# Patient Record
Sex: Female | Born: 1985 | Hispanic: Yes | Marital: Single | Smoking: Current every day smoker
Health system: Southern US, Community
[De-identification: ages and names within clinical notes are randomized; demographics above are authoritative.]

## PROBLEM LIST (undated history)

## (undated) ENCOUNTER — Inpatient Hospital Stay (HOSPITAL_COMMUNITY): Payer: Self-pay

## (undated) DIAGNOSIS — D649 Anemia, unspecified: Secondary | ICD-10-CM

## (undated) DIAGNOSIS — K439 Ventral hernia without obstruction or gangrene: Secondary | ICD-10-CM

## (undated) DIAGNOSIS — Z23 Encounter for immunization: Secondary | ICD-10-CM

## (undated) DIAGNOSIS — T8859XA Other complications of anesthesia, initial encounter: Secondary | ICD-10-CM

## (undated) DIAGNOSIS — R590 Localized enlarged lymph nodes: Secondary | ICD-10-CM

## (undated) DIAGNOSIS — A630 Anogenital (venereal) warts: Secondary | ICD-10-CM

## (undated) DIAGNOSIS — J45909 Unspecified asthma, uncomplicated: Secondary | ICD-10-CM

## (undated) DIAGNOSIS — O26619 Liver and biliary tract disorders in pregnancy, unspecified trimester: Secondary | ICD-10-CM

## (undated) DIAGNOSIS — O9989 Other specified diseases and conditions complicating pregnancy, childbirth and the puerperium: Secondary | ICD-10-CM

## (undated) DIAGNOSIS — T4145XA Adverse effect of unspecified anesthetic, initial encounter: Secondary | ICD-10-CM

## (undated) DIAGNOSIS — F17200 Nicotine dependence, unspecified, uncomplicated: Secondary | ICD-10-CM

## (undated) DIAGNOSIS — O99891 Other specified diseases and conditions complicating pregnancy: Secondary | ICD-10-CM

## (undated) DIAGNOSIS — F419 Anxiety disorder, unspecified: Principal | ICD-10-CM

## (undated) DIAGNOSIS — F329 Major depressive disorder, single episode, unspecified: Secondary | ICD-10-CM

## (undated) DIAGNOSIS — IMO0002 Reserved for concepts with insufficient information to code with codable children: Secondary | ICD-10-CM

## (undated) DIAGNOSIS — Z6791 Unspecified blood type, Rh negative: Secondary | ICD-10-CM

## (undated) DIAGNOSIS — Z8614 Personal history of Methicillin resistant Staphylococcus aureus infection: Secondary | ICD-10-CM

## (undated) DIAGNOSIS — F32A Depression, unspecified: Secondary | ICD-10-CM

## (undated) DIAGNOSIS — R87629 Unspecified abnormal cytological findings in specimens from vagina: Secondary | ICD-10-CM

## (undated) HISTORY — DX: Encounter for immunization: Z23

## (undated) HISTORY — PX: CHOLECYSTECTOMY: SHX55

## (undated) HISTORY — DX: Ventral hernia without obstruction or gangrene: K43.9

## (undated) HISTORY — PX: FRACTURE SURGERY: SHX138

## (undated) HISTORY — DX: Nicotine dependence, unspecified, uncomplicated: F17.200

## (undated) HISTORY — DX: Unspecified abnormal cytological findings in specimens from vagina: R87.629

## (undated) HISTORY — DX: Anogenital (venereal) warts: A63.0

## (undated) HISTORY — DX: Localized enlarged lymph nodes: R59.0

## (undated) HISTORY — DX: Anxiety disorder, unspecified: F41.9

## (undated) HISTORY — PX: UPPER GI ENDOSCOPY: SHX6162

## (undated) HISTORY — PX: TUBAL LIGATION: SHX77

## (undated) HISTORY — PX: THERAPEUTIC ABORTION: SHX798

## (undated) HISTORY — PX: DILATION AND CURETTAGE OF UTERUS: SHX78

---
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
## 2007-01-02 ENCOUNTER — Emergency Department (HOSPITAL_COMMUNITY): Admission: EM | Admit: 2007-01-02 | Discharge: 2007-01-02 | Payer: Self-pay | Admitting: *Deleted

## 2007-01-21 ENCOUNTER — Inpatient Hospital Stay (HOSPITAL_COMMUNITY): Admission: AD | Admit: 2007-01-21 | Discharge: 2007-01-21 | Payer: Self-pay | Admitting: Obstetrics and Gynecology

## 2007-01-21 LAB — CONVERTED CEMR LAB: Hemoglobin: 14.2 g/dL

## 2007-03-31 ENCOUNTER — Inpatient Hospital Stay (HOSPITAL_COMMUNITY): Admission: AD | Admit: 2007-03-31 | Discharge: 2007-03-31 | Payer: Self-pay | Admitting: Obstetrics & Gynecology

## 2007-03-31 LAB — CONVERTED CEMR LAB
Antibody Screen: NEGATIVE
Chlamydia, DNA Probe: NEGATIVE
GC Culture Only: NEGATIVE
Rh Type: NEGATIVE
Rubella antibody, serum, titer: NON-IMMUNE/NOT IMMUNE

## 2007-04-15 ENCOUNTER — Encounter: Payer: Self-pay | Admitting: Family Medicine

## 2007-04-15 ENCOUNTER — Ambulatory Visit: Payer: Self-pay | Admitting: *Deleted

## 2007-05-27 ENCOUNTER — Encounter (INDEPENDENT_AMBULATORY_CARE_PROVIDER_SITE_OTHER): Payer: Self-pay | Admitting: Family Medicine

## 2007-05-27 ENCOUNTER — Ambulatory Visit: Payer: Self-pay | Admitting: Family Medicine

## 2007-05-27 DIAGNOSIS — O34219 Maternal care for unspecified type scar from previous cesarean delivery: Secondary | ICD-10-CM | POA: Insufficient documentation

## 2007-05-27 DIAGNOSIS — O09213 Supervision of pregnancy with history of pre-term labor, third trimester: Secondary | ICD-10-CM

## 2007-05-27 DIAGNOSIS — F329 Major depressive disorder, single episode, unspecified: Secondary | ICD-10-CM | POA: Insufficient documentation

## 2007-05-27 LAB — CONVERTED CEMR LAB: Protein, U semiquant: NEGATIVE

## 2007-05-28 ENCOUNTER — Encounter (INDEPENDENT_AMBULATORY_CARE_PROVIDER_SITE_OTHER): Payer: Self-pay | Admitting: Family Medicine

## 2007-06-08 ENCOUNTER — Ambulatory Visit: Payer: Self-pay | Admitting: Family Medicine

## 2007-06-08 LAB — CONVERTED CEMR LAB: Glucose, Urine, Semiquant: NEGATIVE

## 2007-06-10 ENCOUNTER — Telehealth: Payer: Self-pay | Admitting: *Deleted

## 2007-06-17 ENCOUNTER — Ambulatory Visit: Payer: Self-pay | Admitting: Family Medicine

## 2007-06-17 ENCOUNTER — Encounter: Payer: Self-pay | Admitting: Family Medicine

## 2007-06-17 LAB — CONVERTED CEMR LAB: Glucose, Urine, Semiquant: NEGATIVE

## 2007-06-30 ENCOUNTER — Encounter: Payer: Self-pay | Admitting: Family Medicine

## 2007-06-30 ENCOUNTER — Ambulatory Visit: Payer: Self-pay | Admitting: Family Medicine

## 2007-06-30 LAB — CONVERTED CEMR LAB
Glucose, Urine, Semiquant: NEGATIVE
Protein, U semiquant: NEGATIVE

## 2007-07-07 ENCOUNTER — Telehealth: Payer: Self-pay | Admitting: *Deleted

## 2007-07-07 ENCOUNTER — Inpatient Hospital Stay (HOSPITAL_COMMUNITY): Admission: AD | Admit: 2007-07-07 | Discharge: 2007-07-07 | Payer: Self-pay | Admitting: Obstetrics & Gynecology

## 2007-07-14 ENCOUNTER — Ambulatory Visit: Payer: Self-pay | Admitting: Family Medicine

## 2007-07-14 ENCOUNTER — Encounter: Payer: Self-pay | Admitting: Family Medicine

## 2007-07-14 LAB — CONVERTED CEMR LAB: Glucose, Urine, Semiquant: NEGATIVE

## 2007-07-20 ENCOUNTER — Telehealth: Payer: Self-pay | Admitting: *Deleted

## 2007-07-20 ENCOUNTER — Encounter (INDEPENDENT_AMBULATORY_CARE_PROVIDER_SITE_OTHER): Payer: Self-pay | Admitting: Family Medicine

## 2007-07-27 ENCOUNTER — Encounter: Payer: Self-pay | Admitting: Family Medicine

## 2007-07-27 ENCOUNTER — Ambulatory Visit: Payer: Self-pay | Admitting: Sports Medicine

## 2007-07-27 LAB — CONVERTED CEMR LAB
Chlamydia, DNA Probe: NEGATIVE
GC Probe Amp, Genital: NEGATIVE
Glucose, Urine, Semiquant: NEGATIVE
Protein, U semiquant: NEGATIVE

## 2007-07-28 ENCOUNTER — Encounter (INDEPENDENT_AMBULATORY_CARE_PROVIDER_SITE_OTHER): Payer: Self-pay | Admitting: Family Medicine

## 2007-08-03 ENCOUNTER — Encounter: Payer: Self-pay | Admitting: *Deleted

## 2007-08-10 ENCOUNTER — Telehealth: Payer: Self-pay | Admitting: *Deleted

## 2007-08-17 ENCOUNTER — Encounter: Payer: Self-pay | Admitting: Family Medicine

## 2007-08-17 ENCOUNTER — Ambulatory Visit: Payer: Self-pay | Admitting: Family Medicine

## 2007-08-17 LAB — CONVERTED CEMR LAB: Protein, U semiquant: NEGATIVE

## 2007-08-19 ENCOUNTER — Inpatient Hospital Stay (HOSPITAL_COMMUNITY): Admission: AD | Admit: 2007-08-19 | Discharge: 2007-08-19 | Payer: Self-pay | Admitting: Obstetrics & Gynecology

## 2007-08-19 ENCOUNTER — Ambulatory Visit: Payer: Self-pay | Admitting: *Deleted

## 2007-08-24 ENCOUNTER — Ambulatory Visit: Payer: Self-pay | Admitting: Family Medicine

## 2007-08-24 ENCOUNTER — Inpatient Hospital Stay (HOSPITAL_COMMUNITY): Admission: RE | Admit: 2007-08-24 | Discharge: 2007-08-27 | Payer: Self-pay | Admitting: Obstetrics & Gynecology

## 2007-08-24 ENCOUNTER — Ambulatory Visit: Payer: Self-pay | Admitting: Obstetrics & Gynecology

## 2007-09-15 ENCOUNTER — Ambulatory Visit: Payer: Self-pay | Admitting: Gynecology

## 2007-09-15 ENCOUNTER — Inpatient Hospital Stay (HOSPITAL_COMMUNITY): Admission: AD | Admit: 2007-09-15 | Discharge: 2007-09-16 | Payer: Self-pay | Admitting: Gynecology

## 2007-11-02 ENCOUNTER — Ambulatory Visit: Payer: Self-pay | Admitting: Sports Medicine

## 2007-11-02 ENCOUNTER — Encounter (INDEPENDENT_AMBULATORY_CARE_PROVIDER_SITE_OTHER): Payer: Self-pay | Admitting: Family Medicine

## 2007-11-02 DIAGNOSIS — N939 Abnormal uterine and vaginal bleeding, unspecified: Secondary | ICD-10-CM

## 2007-11-02 DIAGNOSIS — N926 Irregular menstruation, unspecified: Secondary | ICD-10-CM

## 2007-11-02 LAB — CONVERTED CEMR LAB
MCHC: 32.2 g/dL (ref 30.0–36.0)
MCV: 88.8 fL (ref 78.0–100.0)
Platelets: 371 10*3/uL (ref 150–400)
RBC: 4.75 M/uL (ref 3.87–5.11)

## 2007-11-03 ENCOUNTER — Ambulatory Visit (HOSPITAL_COMMUNITY): Admission: RE | Admit: 2007-11-03 | Discharge: 2007-11-03 | Payer: Self-pay | Admitting: Family Medicine

## 2007-11-03 ENCOUNTER — Encounter: Payer: Self-pay | Admitting: *Deleted

## 2007-11-03 ENCOUNTER — Telehealth (INDEPENDENT_AMBULATORY_CARE_PROVIDER_SITE_OTHER): Payer: Self-pay | Admitting: Family Medicine

## 2007-11-17 ENCOUNTER — Encounter: Payer: Self-pay | Admitting: *Deleted

## 2008-10-15 ENCOUNTER — Ambulatory Visit: Payer: Self-pay | Admitting: Obstetrics & Gynecology

## 2008-10-15 ENCOUNTER — Inpatient Hospital Stay (HOSPITAL_COMMUNITY): Admission: AD | Admit: 2008-10-15 | Discharge: 2008-10-15 | Payer: Self-pay | Admitting: Obstetrics & Gynecology

## 2009-01-12 ENCOUNTER — Inpatient Hospital Stay (HOSPITAL_COMMUNITY): Admission: AD | Admit: 2009-01-12 | Discharge: 2009-01-12 | Payer: Self-pay | Admitting: Obstetrics & Gynecology

## 2009-02-16 ENCOUNTER — Inpatient Hospital Stay (HOSPITAL_COMMUNITY): Admission: AD | Admit: 2009-02-16 | Discharge: 2009-02-16 | Payer: Self-pay | Admitting: Obstetrics & Gynecology

## 2009-02-16 ENCOUNTER — Ambulatory Visit: Payer: Self-pay | Admitting: Obstetrics and Gynecology

## 2009-02-22 ENCOUNTER — Ambulatory Visit: Payer: Self-pay | Admitting: Obstetrics & Gynecology

## 2009-02-22 ENCOUNTER — Encounter: Payer: Self-pay | Admitting: Physician Assistant

## 2009-02-22 LAB — CONVERTED CEMR LAB
Antibody Screen: NEGATIVE
Eosinophils Relative: 2 % (ref 0–5)
HCT: 37.4 % (ref 36.0–46.0)
Hemoglobin: 12.3 g/dL (ref 12.0–15.0)
Hepatitis B Surface Ag: NEGATIVE
Lymphocytes Relative: 18 % (ref 12–46)
Lymphs Abs: 2.8 10*3/uL (ref 0.7–4.0)
Neutro Abs: 11.6 10*3/uL — ABNORMAL HIGH (ref 1.7–7.7)
Platelets: 273 10*3/uL (ref 150–400)
RDW: 13.5 % (ref 11.5–15.5)
Rh Type: NEGATIVE
Rubella: 119.9 intl units/mL — ABNORMAL HIGH

## 2009-03-29 ENCOUNTER — Ambulatory Visit: Payer: Self-pay | Admitting: Obstetrics and Gynecology

## 2009-04-08 ENCOUNTER — Inpatient Hospital Stay (HOSPITAL_COMMUNITY): Admission: AD | Admit: 2009-04-08 | Discharge: 2009-04-08 | Payer: Self-pay | Admitting: Family Medicine

## 2009-04-12 ENCOUNTER — Ambulatory Visit: Payer: Self-pay | Admitting: Obstetrics and Gynecology

## 2009-04-15 ENCOUNTER — Ambulatory Visit: Payer: Self-pay | Admitting: Advanced Practice Midwife

## 2009-04-15 ENCOUNTER — Inpatient Hospital Stay (HOSPITAL_COMMUNITY): Admission: AD | Admit: 2009-04-15 | Discharge: 2009-04-15 | Payer: Self-pay | Admitting: Obstetrics & Gynecology

## 2009-04-22 ENCOUNTER — Encounter (INDEPENDENT_AMBULATORY_CARE_PROVIDER_SITE_OTHER): Payer: Self-pay | Admitting: *Deleted

## 2009-04-22 DIAGNOSIS — F17200 Nicotine dependence, unspecified, uncomplicated: Secondary | ICD-10-CM

## 2009-04-26 ENCOUNTER — Encounter: Payer: Self-pay | Admitting: Advanced Practice Midwife

## 2009-04-26 ENCOUNTER — Ambulatory Visit: Payer: Self-pay | Admitting: Obstetrics and Gynecology

## 2009-04-27 ENCOUNTER — Encounter: Payer: Self-pay | Admitting: Advanced Practice Midwife

## 2009-05-03 ENCOUNTER — Ambulatory Visit: Payer: Self-pay | Admitting: Obstetrics & Gynecology

## 2009-05-03 ENCOUNTER — Encounter: Payer: Self-pay | Admitting: Obstetrics & Gynecology

## 2009-05-03 LAB — CONVERTED CEMR LAB
ALT: 10 units/L (ref 0–35)
Albumin: 3.6 g/dL (ref 3.5–5.2)
Alkaline Phosphatase: 162 units/L — ABNORMAL HIGH (ref 39–117)
Bilirubin, Direct: 0.1 mg/dL (ref 0.0–0.3)
Total Bilirubin: 0.3 mg/dL (ref 0.3–1.2)
Total Protein: 6.5 g/dL (ref 6.0–8.3)

## 2009-05-05 ENCOUNTER — Inpatient Hospital Stay (HOSPITAL_COMMUNITY): Admission: AD | Admit: 2009-05-05 | Discharge: 2009-05-06 | Payer: Self-pay | Admitting: Obstetrics & Gynecology

## 2009-05-05 ENCOUNTER — Ambulatory Visit: Payer: Self-pay | Admitting: Physician Assistant

## 2009-05-06 ENCOUNTER — Inpatient Hospital Stay (HOSPITAL_COMMUNITY): Admission: AD | Admit: 2009-05-06 | Discharge: 2009-05-09 | Payer: Self-pay | Admitting: Obstetrics & Gynecology

## 2010-01-01 ENCOUNTER — Emergency Department (HOSPITAL_BASED_OUTPATIENT_CLINIC_OR_DEPARTMENT_OTHER): Admission: EM | Admit: 2010-01-01 | Discharge: 2010-01-01 | Payer: Self-pay | Admitting: Emergency Medicine

## 2010-05-05 ENCOUNTER — Inpatient Hospital Stay (HOSPITAL_COMMUNITY): Admission: AD | Admit: 2010-05-05 | Discharge: 2010-05-05 | Payer: Self-pay | Admitting: Obstetrics & Gynecology

## 2010-05-05 ENCOUNTER — Ambulatory Visit: Payer: Self-pay | Admitting: Advanced Practice Midwife

## 2010-08-30 ENCOUNTER — Inpatient Hospital Stay (HOSPITAL_COMMUNITY)
Admission: AD | Admit: 2010-08-30 | Discharge: 2010-08-30 | Payer: Self-pay | Source: Home / Self Care | Attending: Obstetrics & Gynecology | Admitting: Obstetrics & Gynecology

## 2010-08-30 LAB — POCT PREGNANCY, URINE: Preg Test, Ur: NEGATIVE

## 2010-08-30 LAB — WET PREP, GENITAL
Trich, Wet Prep: NONE SEEN
Yeast Wet Prep HPF POC: NONE SEEN

## 2010-08-30 LAB — URINALYSIS, ROUTINE W REFLEX MICROSCOPIC
Bilirubin Urine: NEGATIVE
Hgb urine dipstick: NEGATIVE
Specific Gravity, Urine: >1.03 — ABNORMAL HIGH (ref 1.005–1.030)
Urine Glucose, Fasting: NEGATIVE mg/dL
Urobilinogen, UA: 0.2 mg/dL (ref 0.0–1.0)
pH: 6 (ref 5.0–8.0)

## 2010-08-30 LAB — URINE MICROSCOPIC-ADD ON

## 2010-09-03 LAB — HERPES SIMPLEX VIRUS CULTURE

## 2010-10-01 ENCOUNTER — Encounter (INDEPENDENT_AMBULATORY_CARE_PROVIDER_SITE_OTHER): Payer: Self-pay | Admitting: Family Medicine

## 2010-10-01 ENCOUNTER — Other Ambulatory Visit: Payer: Self-pay | Admitting: Family Medicine

## 2010-10-01 ENCOUNTER — Encounter: Payer: Self-pay | Admitting: Obstetrics & Gynecology

## 2010-10-01 ENCOUNTER — Ambulatory Visit (INDEPENDENT_AMBULATORY_CARE_PROVIDER_SITE_OTHER): Payer: Self-pay | Admitting: Family Medicine

## 2010-10-01 DIAGNOSIS — A6000 Herpesviral infection of urogenital system, unspecified: Secondary | ICD-10-CM

## 2010-10-01 DIAGNOSIS — Z01419 Encounter for gynecological examination (general) (routine) without abnormal findings: Secondary | ICD-10-CM

## 2010-10-01 LAB — CONVERTED CEMR LAB
Chlamydia, DNA Probe: NEGATIVE
GC Probe Amp, Genital: NEGATIVE
Hepatitis B Surface Ag: NEGATIVE

## 2010-10-01 LAB — POCT PREGNANCY, URINE: Preg Test, Ur: NEGATIVE

## 2010-10-17 LAB — URINALYSIS, ROUTINE W REFLEX MICROSCOPIC
Glucose, UA: NEGATIVE mg/dL
Hgb urine dipstick: NEGATIVE
Specific Gravity, Urine: 1.025 (ref 1.005–1.030)
pH: 6 (ref 5.0–8.0)

## 2010-10-17 LAB — WET PREP, GENITAL: Yeast Wet Prep HPF POC: NONE SEEN

## 2010-10-17 LAB — GC/CHLAMYDIA PROBE AMP, GENITAL
Chlamydia, DNA Probe: NEGATIVE
GC Probe Amp, Genital: NEGATIVE

## 2010-10-17 LAB — POCT PREGNANCY, URINE: Preg Test, Ur: NEGATIVE

## 2010-10-22 LAB — URINALYSIS, ROUTINE W REFLEX MICROSCOPIC
Ketones, ur: NEGATIVE mg/dL
Nitrite: NEGATIVE

## 2010-10-22 LAB — GC/CHLAMYDIA PROBE AMP, GENITAL
Chlamydia, DNA Probe: NEGATIVE
GC Probe Amp, Genital: NEGATIVE

## 2010-10-22 LAB — CBC
HCT: 40 % (ref 36.0–46.0)
Hemoglobin: 13.3 g/dL (ref 12.0–15.0)
MCHC: 33.4 g/dL (ref 30.0–36.0)
MCV: 90.2 fL (ref 78.0–100.0)
RBC: 4.43 MIL/uL (ref 3.87–5.11)
RDW: 13 % (ref 11.5–15.5)

## 2010-10-26 NOTE — Progress Notes (Signed)
NAME:  [name], [name]              ACCOUNT NO.:  [id number]  MEDICAL RECORD NO.:  [id number]           PATIENT TYPE:  A  LOCATION:  [facility]                   FACILITY:  [facility]  PHYSICIAN:  Maryelizabeth Kaufmann, MD  DATE OF BIRTH:  [birth year]  DATE OF SERVICE:  10/01/2010                                 CLINIC NOTE  CHIEF COMPLAINT:  Annual Pap smear.  HISTORY OF PRESENT ILLNESS:  This is a 25 year old gravida 4, para 3-0-1- 3, with history of previous C-section x3 who presents with several complaints.  Most recently, she did go to the [facility] where she states she was diagnosed with herpes simplex virus outbreak.  She states that she was started on some pills, although she is unsure what the name is and she states that her symptoms resolved.  She also now complains of some non-painful lesions in the genital area, but she is not sure what they are.  So, she desires to be checked for all STDs and she desires the urine pregnancy test.  She states that she has had one new partner within the last year.  She has a lifetime partners of about 11.  She is currently sexually active, on no form of contraception.  However, she is unclear if she desires current pregnancy.  She otherwise denies any vaginal itching.  No odor, but does note some white discharge.  She does have a history of gonorrhea in 2005 and she never had a Pap smear before.  PHYSICAL EXAMINATION:  VITAL SIGNS:  Blood pressure 97/61, pulses 80, temperature 98.8, weight 119.5, and height 4 feet 11 inches. GENERAL:  The patient is in no acute distress.  Alert and oriented x4. NECK:  Supple.  Soft.  No thyromegaly. CARDIOVASCULAR:  Regular rate and rhythm.  No murmurs, rubs, or gallops. CHEST:  Clear to auscultation bilaterally.  No wheezing, rales, or rhonchi. BREASTS:  Soft and nontender.  No masses.  No discharge.  No axillary lymphadenopathy. ABDOMEN:  Positive bowel sounds.  Soft.  She does have some suprapubic and left lower  quadrant tenderness to palpation, but no rebound.  No guarding. EXTREMITIES:  No clubbing, cyanosis, or edema. GU:  She does have what appears to be may be some skin tags on her right labia majora inferiorly approximately at about 7 o'clock, they are otherwise nontender.  They do not appear herpetic.  They are not vesicular.  Otherwise, external genitalia appeared to be normal with no additional lesion.  Vaginal mucosa appeared to be pink and normal. Cervix appeared to be normal.  No lesions.  A Pap smear was performed as well as gonorrhea and Chlamydia.  Cervical motion tenderness is negative on bimanual exam, but she did have some tenderness on suprapubic pressure.  Uterus appeared to be anteverted, otherwise, mobile, soft. No masses palpable bilaterally.  ASSESSMENT AND PLAN:  This is a 25 year old gravida 4, para 3-0-1-3 with, 1. HSV, status post resolution.  We will start prophylactic acyclovir     400 mg one tab p.o. b.i.d.  We will also check HSV 1 and 2 titers,     IgM and IgG. 2. STD exposure.  We will  check for gonorrhea, Chlamydia, HIV, RPR,     and acute hepatitis panel. 3. Well woman with Pap along with all of her labs appear to be normal.     We will have the patient back in 8-6 months to a year for either     annual checkup or any further complications or problems.          ______________________________ Maryelizabeth Kaufmann, MD    LC/MEDQ  D:  10/01/2010  T:  10/02/2010  Job:  [id number]

## 2010-11-08 LAB — CBC
HCT: 40.3 % (ref 36.0–46.0)
Hemoglobin: 10.2 g/dL — ABNORMAL LOW (ref 12.0–15.0)
Hemoglobin: 13.4 g/dL (ref 12.0–15.0)
MCHC: 33.9 g/dL (ref 30.0–36.0)
Platelets: 224 10*3/uL (ref 150–400)
RBC: 4.38 MIL/uL (ref 3.87–5.11)
RDW: 13.8 % (ref 11.5–15.5)
RDW: 13.8 % (ref 11.5–15.5)
WBC: 17.9 10*3/uL — ABNORMAL HIGH (ref 4.0–10.5)

## 2010-11-08 LAB — RH IMMUNE GLOB WKUP(>/=20WKS)(NOT WOMEN'S HOSP): Fetal Screen: NEGATIVE

## 2010-11-09 LAB — POCT URINALYSIS DIP (DEVICE)
Bilirubin Urine: NEGATIVE
Glucose, UA: NEGATIVE mg/dL
Hgb urine dipstick: NEGATIVE
Ketones, ur: NEGATIVE mg/dL
Ketones, ur: NEGATIVE mg/dL
Ketones, ur: NEGATIVE mg/dL
Nitrite: NEGATIVE
Protein, ur: NEGATIVE mg/dL
Protein, ur: NEGATIVE mg/dL
Protein, ur: NEGATIVE mg/dL
Specific Gravity, Urine: 1.015 (ref 1.005–1.030)
Urobilinogen, UA: 0.2 mg/dL (ref 0.0–1.0)
pH: 6.5 (ref 5.0–8.0)
pH: 7 (ref 5.0–8.0)

## 2010-11-09 LAB — URINALYSIS, ROUTINE W REFLEX MICROSCOPIC
Ketones, ur: NEGATIVE mg/dL
Nitrite: NEGATIVE
Protein, ur: NEGATIVE mg/dL

## 2010-11-09 LAB — WET PREP, GENITAL: Yeast Wet Prep HPF POC: NONE SEEN

## 2010-11-10 LAB — POCT URINALYSIS DIP (DEVICE)
Glucose, UA: NEGATIVE mg/dL
Hgb urine dipstick: NEGATIVE
Protein, ur: NEGATIVE mg/dL
Specific Gravity, Urine: 1.02 (ref 1.005–1.030)
Urobilinogen, UA: 0.2 mg/dL (ref 0.0–1.0)
pH: 7 (ref 5.0–8.0)

## 2010-11-11 LAB — POCT URINALYSIS DIP (DEVICE)
Bilirubin Urine: NEGATIVE
Hgb urine dipstick: NEGATIVE
Nitrite: NEGATIVE
Protein, ur: 30 mg/dL — AB
pH: 6 (ref 5.0–8.0)

## 2010-11-11 LAB — COMPREHENSIVE METABOLIC PANEL
CO2: 23 mEq/L (ref 19–32)
Calcium: 8.5 mg/dL (ref 8.4–10.5)
Creatinine, Ser: 0.48 mg/dL (ref 0.4–1.2)
GFR calc Af Amer: >60 mL/min (ref >60)
GFR calc non Af Amer: >60 mL/min (ref >60)
Glucose, Bld: 83 mg/dL (ref 70–99)

## 2010-11-11 LAB — RAPID URINE DRUG SCREEN, HOSP PERFORMED
Barbiturates: NOT DETECTED
Benzodiazepines: NOT DETECTED
Cocaine: NOT DETECTED
Opiates: NOT DETECTED

## 2010-11-11 LAB — CBC
Hemoglobin: 11.5 g/dL — ABNORMAL LOW (ref 12.0–15.0)
MCHC: 34.8 g/dL (ref 30.0–36.0)
MCV: 93 fL (ref 78.0–100.0)
RBC: 3.56 MIL/uL — ABNORMAL LOW (ref 3.87–5.11)

## 2010-11-11 LAB — URINALYSIS, ROUTINE W REFLEX MICROSCOPIC
Bilirubin Urine: NEGATIVE
Hgb urine dipstick: NEGATIVE
Specific Gravity, Urine: 1.02 (ref 1.005–1.030)
Urobilinogen, UA: 0.2 mg/dL (ref 0.0–1.0)

## 2010-11-12 LAB — URINALYSIS, ROUTINE W REFLEX MICROSCOPIC
Glucose, UA: NEGATIVE mg/dL
Nitrite: NEGATIVE
Protein, ur: NEGATIVE mg/dL

## 2010-11-12 LAB — DIFFERENTIAL
Basophils Absolute: 0 10*3/uL (ref 0.0–0.1)
Lymphocytes Relative: 17 % (ref 12–46)
Monocytes Absolute: 0.8 10*3/uL (ref 0.1–1.0)
Monocytes Relative: 6 % (ref 3–12)
Neutro Abs: 11 10*3/uL — ABNORMAL HIGH (ref 1.7–7.7)

## 2010-11-12 LAB — WET PREP, GENITAL: Clue Cells Wet Prep HPF POC: NONE SEEN

## 2010-11-12 LAB — CBC
Hemoglobin: 12 g/dL (ref 12.0–15.0)
RBC: 3.63 MIL/uL — ABNORMAL LOW (ref 3.87–5.11)

## 2010-11-15 LAB — CBC
Platelets: 258 10*3/uL (ref 150–400)
RDW: 13.1 % (ref 11.5–15.5)
WBC: 12.5 10*3/uL — ABNORMAL HIGH (ref 4.0–10.5)

## 2010-11-15 LAB — URINALYSIS, ROUTINE W REFLEX MICROSCOPIC
Glucose, UA: NEGATIVE mg/dL
Hgb urine dipstick: NEGATIVE
Ketones, ur: 15 mg/dL — AB
Nitrite: NEGATIVE
Specific Gravity, Urine: >1.03 — ABNORMAL HIGH (ref 1.005–1.030)
pH: 6.5 (ref 5.0–8.0)

## 2010-11-15 LAB — RAPID URINE DRUG SCREEN, HOSP PERFORMED
Barbiturates: NOT DETECTED
Cocaine: NOT DETECTED
Opiates: NOT DETECTED

## 2010-11-15 LAB — GC/CHLAMYDIA PROBE AMP, URINE: Chlamydia, Swab/Urine, PCR: NEGATIVE

## 2010-11-15 LAB — GLUCOSE, CAPILLARY: Glucose-Capillary: 87 mg/dL (ref 70–99)

## 2010-11-15 LAB — ABO/RH: ABO/RH(D): O NEG

## 2010-12-18 NOTE — Op Note (Signed)
[name], [name]              ACCOUNT NO.:  [id number]   MEDICAL RECORD NO.:  [id number]          PATIENT TYPE:  INP   LOCATION:  9107                          [facility]   PHYSICIAN:  Norton Blizzard, MD    DATE OF BIRTH:  06-19-1986   DATE OF PROCEDURE:  08/24/2007  DATE OF DISCHARGE:                               OPERATIVE REPORT   PREOPERATIVE DIAGNOSES:  1. Intrauterine pregnancy at 39-6/7 weeks.  2. Prior cesarean section.  3. Desires elective repeat.   POSTOPERATIVE DIAGNOSES:  1. Intrauterine pregnancy at 39-6/7 weeks.  2. Prior cesarean section.  3. Desires elective repeat.   PROCEDURE:  Repeat low transverse cesarean section via Pfannenstiel  incision.   SURGEON:  Norton Blizzard, MD.   ASSISTANTS:  1. Karlton Lemon, MD.  2. Tamela Oddi, PA student.   ANESTHESIA:  Spinal.   IV FLUIDS:  3100 mL of lactated Ringers.   URINE OUTPUT:  175 mL of clear, yellow urine.   ESTIMATED BLOOD LOSS:  600 mL.   INDICATIONS FOR PROCEDURE:  Patient is a 25 year old G3, P1-0-1-1 at 38-  6/7 weeks here for scheduled repeat cesarean section.  She has a history  of a prior cesarean section in 2007 for failure to progress,  nonreassuring fetal heart tracing and persistent occiput posterior.  She  was [facility] for her prenatal [facility].  Patient was given an option of trial of labor after cesarean section  versus repeat cesarean section but opted for a repeat cesarean section.  The risks of cesarean section were explained to the patient on the day  of surgery and written informed consent was obtained.   FINDINGS:  Viable female infant in cephalic presentation.  Apgars were 9  and 9.  Weight 6 pounds, 15 ounces.  Spontaneous placental delivery,  intact with three vessel cord.  Normal uterus, tubes and ovaries  bilaterally.  Minimal adhesions or scar tissue observed.   SPECIMENS:  Placenta.   COMPLICATIONS:  None.   DESCRIPTION OF PROCEDURE:   The patient was taken to the operating room  where spinal anesthesia was placed and found to be adequate.  She was  then placed in the dorsal supine position with a leftward tilt, and  prepped and draped in a sterile manner.  A Pfannenstiel incision was  made over her pre-existing scar and taken through to the underlying  layer of fascia.  The fascia was incised in the midline, and this  incision was extended bilaterally using the Mayo scissors.  Kocher  clamps were applied to the superior aspect of the fascial incision, and  the rectus muscles were dissected off bluntly and sharply.  A similar  process was carried out on the inferior aspect of the incision.  The  rectus muscles were separated in the midline sharply, and the peritoneum  was also entered sharply.  The peritoneal incision was extended  superiorly and posteriorly with good visualization of bowel and bladder.  Attention was then turned to the lower uterine segment where bladder  flap was created and a transverse hysterotomy was  made with a scalpel  and extended bluntly.  The amniotic fluid sac was then ruptured for  clear fluid.  The infant was then delivered atraumatically.  The cord  was cut and clamped and the infant was handed over to the waiting  pediatricians.  Cord blood was collected according to protocol.  Uterine  massage was administered, and the placenta delivered intact with three  vessel cord.  The uterus was then cleared of all clot and debris.  The  hysterotomy was repaired using 0 Monocryl in running locked fashion.  Overall, good hemostasis was noted.  The gutters of the pelvis were then  cleared of all clots and debris.  The muscles were reapproximated using  two interrupted sutures of 0 Vicryl. The fascia was reapproximated using  0 Vicryl in a running stitch, and skin was closed with staples.  The  patient tolerated procedure well.  Sponge, needle and instrument counts  were correct x2.  She was taken to  the recovery room awake and in stable  condition.      Norton Blizzard, MD  Electronically Signed     UAD/MEDQ  D:  08/24/2007  T:  08/24/2007  Job:  [id number]

## 2010-12-18 NOTE — Discharge Summary (Signed)
[name], [name]              ACCOUNT NO.:  [id number]   MEDICAL RECORD NO.:  [id number]          PATIENT TYPE:  INP   LOCATION:  9107                          [facility]   PHYSICIAN:  Norton Blizzard, MD    DATE OF BIRTH:  04/07/1986   DATE OF ADMISSION:  08/24/2007  DATE OF DISCHARGE:  08/19/2007                               DISCHARGE SUMMARY   ADMISSION DIAGNOSIS:  1. Intrauterine pregnancy at 39 weeks, 1 day.  2. History of previous C-section x1. Desired repeat C-section.  3. Rh negative blood type.  4. Group B streptococcus unknown.   DISCHARGE DIAGNOSIS:  1. On postoperative day number three from repeat low transverse      Cesarean section.  2. Rh negative blood type.  3. Anemia.  4. Group B streptococcus unknown.   PROCEDURE:  The patient had a repeat low transverse Cesarean section  performed by Dr. Johnella Moloney on August 24, 2007.   COMPLICATIONS:  None.   CONSULTATIONS:  None.   LABORATORY DATA:  The patient had an admission CBC with white blood cell  count 12.3, hemoglobin 11.5, platelets 287. Blood type was 0 negative.  Antibody screen was negative. HIV screen was nonreactive, syphilis and  RPR was nonreactive. On postoperative day number one, white blood cell  count was 13.6, hemoglobin 9.2, hematocrit 26.9, platelets 220.   BRIEF PERTINENT ADMISSION HISTORY:  [name] is a 25 year old G-3, P-  1, 0-1-1 at 39 weeks and 1 day, presenting for repeat low transverse  Cesarean section with a history of 1 previous C-section and desires a  repeat.   HOSPITAL COURSE:  The patient was admitted, taken to the operating room  where she delivered a viable infant female weighing 6 pounds 15 ounces  with Apgars of 9 at one minute and 9 at five minutes via repeat low  transverse Cesarean section. The patient tolerated the procedure well  and had uncomplicated postoperative course. She had stable vitals and  remained afebrile. She was ambulating well with mild to  moderate pain.  She was complaining of moderate abdominal tenderness. It was mildly  relieved with Percocet. She states the pain was similar to her previous  C-section. She was tolerating p.o., voiding and passing flatus. As  stated previously she had been afebrile with stable vitals. Her  hemoglobin had dropped to 9.2 and she was started on iron. She is in  stable condition for discharge.   DISCHARGE STATUS:  Stable.   DISCHARGE MEDICATIONS:  1. Prenatal vitamins, 1 tablet p.o. daily.  2. Ibuprofen 600 mg, 1 tablet p.o. q 6h p.r.n. pain.  3 Percocet 5/325, 1-2 tablets q 6h p.r.n. pain.  1. Colace 100 mg, 1 tablet b.i.d.  2. Ferrous sulfate 325 mg, 1 tablet p.o. daily.   DISCHARGE INSTRUCTIONS:  1. Discharged home on regular diet.  2. No sex for 6 weeks.  3. No lifting greater than 15 pounds for 6 weeks.  4. Follow up at [facility] in 6 weeks for      postpartum check and placement of IUD. The patient did  receive Depo-      Provera 50 mg IM prior to discharge.      Karlton Lemon, MD      Norton Blizzard, MD  Electronically Signed    NS/MEDQ  D:  08/27/2007  T:  08/27/2007  Job:  [id number]

## 2010-12-21 NOTE — Op Note (Signed)
NAME:  ZUHA, DEJONGE              ACCOUNT NO.:  [id number]   MEDICAL RECORD NO.:  [id number]          PATIENT TYPE:  INP   LOCATION:  A401                          FACILITY:  [facility]   PHYSICIAN:  Tilda Burrow, M.D. DATE OF BIRTH:  01-07-1986   DATE OF PROCEDURE:  01/18/2006  DATE OF DISCHARGE:                                 OPERATIVE REPORT   PROCEDURE:  Epidural catheter placement.   DETAILS OF PROCEDURE:  Continuous lumbar epidural catheter placed after  acknowledging patient consent.  The patient was placed in the sitting  position, having received a fluid bolus.  The back was prepped and draped  and local anesthesia applied.  An epidural catheter was placed using loss of  resistance technique to a depth of 5.5 cm, whereupon loss of resistance was  encountered.  The epidural bolus dose of 5 mL of 1.5% lidocaine with  epinephrine was infused followed by insertion of epidural catheter, removal  of the Tuohy needle, taping of the catheter to the back and infusion of a 7  mL bolus of 0.125% Marcaine with 2 mcg/mL of fentanyl.  The patient had good  analgesic, symmetric analgesic effect at T10 level with satisfactory patient  response to analgesia.      Tilda Burrow, M.D.  Electronically Signed     JVF/MEDQ  D:  01/18/2006  T:  01/19/2006  Job:  [id number]   cc:   [facility]  [address] [place], [place] [address]

## 2010-12-21 NOTE — Op Note (Signed)
[name], [name]              ACCOUNT NO.:  [id number]   MEDICAL RECORD NO.:  [id number]          PATIENT TYPE:  INP   LOCATION:  A401                          FACILITY[facility]   PHYSICIAN:  Richardean Canal, M.D.  DATE OF BIRTH:  Jan 20, 1986   DATE OF PROCEDURE:  01/18/2006  DATE OF DISCHARGE:                                 OPERATIVE REPORT   PREOPERATIVE DIAGNOSES:  1.  Intrauterine gestation at 39.6 weeks.  2.  Failure to progress.  3.  Nonreassuring fetal heart rate pattern.   POSTOPERATIVE DIAGNOSES:  1.  Intrauterine gestation at 39.6 weeks.  2.  Failure to progress.  3.  Nonreassuring fetal heart rate pattern.  4.  Persistent occiput posterior.   PROCEDURE:  Primary low transverse cesarean section.   SURGEON:  Richardean Canal, M.D.   ANESTHESIA:  Spinal.   DESCRIPTION OF THE OPERATIVE PROCEDURE:  Following induction of spinal  anesthesia, the patient was placed in the supine position with a wedge under  the right hip.  The abdomen was suitably prepped and draped.  The abdomen  was opened through a Pfannenstiel incision.  The uterovesical fold of  peritoneum was incised transversely and the bladder flap developed with  sharp dissection.  A transverse incision was made in the lower uterine  segment and extended with two index finger.  The newborn was presenting  occiput posterior with the head well-engaged.  The head was disengaged from  the pelvis and delivered without difficulty through the incision.  The  airway was suctioned prior to the first breath.  The patient was delivered  of a normal-appearing, 6-pound 3-ounce female infant, Apgars 9/10.  The cord  was clamped and divided, and the newborn was transferred to the pediatrician  for newborn care.  Cord blood was collected.  Ancef 1 g was administered  intravenously.  Pitocin 20 units was added to the intravenous infusion and  run rapidly.  Subsequently, an additional 3 units of Pitocin was  administered  intravenously.  The uterus was quite boggy and was delivered  through the incision and massaged.  The uterus responded with a combination  of massage and Pitocin with minimal blood loss.  The uterus was quite  softened in consistency.  The uterine incision was closed with a running  interlocking 0 chromic suture.  A second layer of 0 chromic suture was  placed in a running noninterlocking stitch with 0 chromic suture.  The line  of incision appeared dry.  By this time, the uterus had contracted well.  The tubes and ovaries were inspected and appeared normal.  The uterus was  replaced in the abdomen.  The gutters were cleansed.  The peritoneum was  washed with normal saline.  The peritoneum was closed with a running 2-0  Vicryl suture.  The rectus muscle were approximated with three interrupted 0  Vicryl sutures.  The fascia was closed with two segmentally run 0 Vicryl  sutures tied together in the midline.  The subcutaneous layer was irrigated  with normal saline and carefully inspected for bleeding. The subcutaneous  layer  appeared dry.  The skin was closed with skin staples.  Estimated blood loss  was 300 mL.  All sponge, instrument, and needle counts were reported  correct.  The patient tolerated the procedure well and left the operating  room in satisfactory postoperative condition.           ______________________________  Richardean Canal, M.D.     RW/MEDQ  D:  01/18/2006  T:  01/20/2006  Job:  [id number]
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
## 2010-12-21 NOTE — Op Note (Signed)
[place]  Patient:    [name], [name]                     MRN: [id number] Proc. Date: 11/19/00 Adm. Date:  [id number] Disc. Date: [id number] Attending:  Ronne Binning                           Operative Report  PREOPERATIVE DIAGNOSIS:  Intra-articular fracture proximal interphalangeal, right little finger.  POSTOPERATIVE DIAGNOSIS:  Intra-articular fracture proximal interphalangeal, right little finger.  OPERATION:  Open reduction internal fixation comminuted intra-articular fracture of right little finger PIP.  SURGEON:  Nicki Reaper, M.D.  ASSISTANT:  Joaquin Courts, R.N.  ANESTHESIA:  General.  DATE OF OPERATION:  November 19, 2000  ANESTHESIOLOGIST:  Kaylyn Layer. Michelle Piper, M.D.  HISTORY:  The patient is a 25 year old female with a history of an injury to her right little finger.  X-rays show a comminuted fracture with significant displacement of the distal portion of the proximal phalanx right little finger.  A thorough discussion with the parents was undertaken with respect to avascular necrosis, stiffness, nonunion of the fracture site, the possibility of infection.  PROCEDURE:  The patient was brought to the operating room where a general anesthesia was carried out without difficulty.  She was prepped and draped using Betadine scrub and solution with the right arm free.  The limb was exsanguinated with an Esmarch bandage.  A tourniquet placed on the forearm was inflated to 200 mmHg.  Attempted manipulation was entirely unsuccessful.  The finger was blocked, being able to flex only approximately 35-40 degrees. Attempted reduction was entirely unsuccessful.  An incision was then made curvilinear over the dorsal aspect of the proximal and middle phalanges, carried down through subcutaneous tissue.  The extensor tendon was split longitudinally and centrally, the joint opened.  A moderate amount of blood was present.  This was irrigated  clear.  The fracture was immediately apparent with marked comminution.  One large fragment was present radially.  This was attempted to be reduced, was entirely unsuccessful.  With some manipulation, taking care to try to preserve as much soft tissue attachment and certainly the collateral ligament, the fracture was able to be reduced.  However, x-rays revealed that the volar portion of the articular surface was displaced proximally as another fragment and blocking flexion.  With manipulation the joint was able to be opened enough to visualize this fracture fragment.  It was then delivered distally, the finger placed into flexion, and it was able to be reduced and maintained.  Pins were placed by hand, firmly fixing the fracture.  However, with the finger in full extension the volar fragment immediately displaced.  There was no way to fix this to the proximal shaft in that a central dorsal portion of the articular surface was an entirely free and missing fragment, having been irrigated clear.  It was decided not to place this fragment back in in that it afforded no significant stability. With the finger flexed approximately 25 degrees the fracture was able to be maintained.  The pin was placed through the loose radial condyle into the shaft, maintaining this into position.  A second wire - 2.8 - was then passed through the middle phalanx into the proximal phalanx and this stabilized the volar fracture.  X-rays revealed this to be reduced in both AP, lateral and oblique positions.  The wound was copiously irrigated with  saline.  The extensor tendon was closed with figure-of-eight 5-0 [place] sutures and the skin was closed with interrupted 5-0 chromic sutures.  The pins were bent and cut short.  A sterile compressive dressing and splint to the ring and little finger were applied.  The patient tolerated the procedure well and was taken to the recovery room for observation in satisfactory  condition.  FOLLOW-UP:  She is discharged home to return to [facility] in one week on Tylenol No. 3 and Septra DS.  It is noted that she received 0.5 g of vancomycin in the operating room. DD:  11/19/00 TD:  11/20/00 Job: [id number]

## 2010-12-21 NOTE — H&P (Signed)
[name], [name]              ACCOUNT NO.:  [id number]   MEDICAL RECORD NO.:  [id number]          PATIENT TYPE:  OBV   LOCATION:  A418                          FACILITY:  [facility]   PHYSICIAN:  Langley Gauss, MD     DATE OF BIRTH:  06-13-1986   DATE OF ADMISSION:  06/10/2005  DATE OF DISCHARGE:  LH                                HISTORY & PHYSICAL   OBSERVATION STATUS:  On June 11, 2005, 25 year old, gravida 5, para 0,  came to the emergency room and was seen by Dr. Margretta Ditty, stating that she  was about four weeks pregnant, no prenatal care today.  Said that when she  urinated today, she looked in the toilet and there were about three clumps  of blood.  For this reason she presented to the emergency room.  I was  contacted due to my OB unassigned obligation on today's date of visit.   ALLERGIES:  No known drug allergies.   MEDICATIONS:  Taking no medications.   PAST MEDICAL HISTORY:  By emergency room history, the patient has had prior  terminations of pregnancies.  No other medical or surgical history.   PHYSICAL EXAMINATION:  VITAL SIGNS:  Blood pressure 98/55, heart rate 79.  GENERAL:  The patient appears somewhat tearful.  HEENT:  Negative.  No adenopathy.  NECK:  Supple. Thyroid is not palpable.  CARDIOVASCULAR:  Regular rate and rhythm.  ABDOMEN:  Soft, nontender.  PELVIC:  Normal external genitalia.  Cervix was noted to be closed with only  minimal active bleeding is identified.  No adnexal masses identified.  No  adnexal masses appreciated on examination.   At 0030 hours, the hospital should have performed OB ultrasound.  However,  sonographer not available.  Thus I initiated observation status with plans  to proceed with transvaginal ultrasound today.   LABORATORY DATA:  The patient is noted to be an O negative blood type.  She  has received full dose RhoGAM.  Her quantitative beta hCG is noted to be  136, 631.  Hemoglobin 12.8, hematocrit 37.3 with the white  count of 16.6.   ASSESSMENT/PLAN:  The patient has no prenatal care to date, complaining  primarily of vaginal bleeding with passage of clots. Thus, diagnosis is  threatened abortion.   PLAN:  Subsequently to proceed with transvaginal ultrasound to ascertain  viability and location of pregnancy.      Langley Gauss, MD  Electronically Signed     DC/MEDQ  D:  06/11/2005  T:  06/11/2005  Job:  [id number]

## 2010-12-21 NOTE — Discharge Summary (Signed)
NAME:  [name], [name]              ACCOUNT NO.:  [id number]   MEDICAL RECORD NO.:  [id number]          PATIENT TYPE:  INP   LOCATION:  A401                          FACILITY:  [facility]   PHYSICIAN:  Tilda Burrow, M.D. DATE OF BIRTH:  1986/07/16   DATE OF ADMISSION:  01/18/2006  DATE OF DISCHARGE:  06/19/2007LH                                 DISCHARGE SUMMARY   ADMITTING DIAGNOSIS:  Pregnancy, term, early labor.   DISCHARGE DIAGNOSES:  1.  Pregnancy, term, delivered.  2.  Cephalopelvic disproportion secondary to persistent occiput posterior      presentation.  3.  Rh negative status, RhoGAM administered.   PROCEDURES:  1.  Pitocin augmentation of labor.  2.  Continuous lumbar epidural catheter placement, January 18, 2006, Dr. Tilda Burrow.  3.  Primary low transverse cervical cesarean section, January 18, 2006, Dr.      Richardean Canal.   DISCHARGE MEDICATIONS:  1.  Multivitamins 1 p.o. daily.  2.  Tylox 20 tablets, 1 q.4 h. p.r.n. pain.   DETAILS OF HOSPITAL SUMMARY:  This 25 year old primipara, followed at the  clinic in [place], was admitted here for labor management.  She arrived  here, considered transfer to [facility], but decided to stay for labor  management.  She was 2-3 cm, vertex 0 station, and membranes intact upon  arrival.   The patient was admitted.  Pitocin augmentation of labor initiated.  Continuous lumbar epidural placed in midafternoon, 3:00 p.m. She progressed  to 6-7 cm dilated but arrested in labor at that time.  She was taken to  primary low transverse cervical cesarean section, delivering a healthy  female infant, Apgars 9 and 9, from an occiput posterior presentation.  Postoperatively, hemoglobin and hematocrit gradually equilibrated, with  hemoglobin 8.6 and hematocrit 24.9, compared to admitting hemoglobin of 12,  hematocrit of 36.  She was considered slow to recover.  She had abdominal  tenderness around the incision, deep in the abdomen,  though the incision  itself looked  fine.  She was discharged home, January 21, 2006, tolerating a regular diet,  with Tylox for pain, and iron tablets, Collagen Forte b.i.d., and prenatal  vitamins.  She is breastfeeding and will follow up in 3 days in our office  for staple removal and incision check.      Tilda Burrow, M.D.  Electronically Signed     JVF/MEDQ  D:  01/21/2006  T:  01/21/2006  Job:  [id number]   cc:   Dr. Milinda Cave

## 2010-12-21 NOTE — H&P (Signed)
NAME:  [name], [name]              ACCOUNT NO.:  [id number]   MEDICAL RECORD NO.:  [id number]          PATIENT TYPE:  INP   LOCATION:  LDR1                          FACILITY:  [facility]   PHYSICIAN:  Richardean Canal, M.D.  DATE OF BIRTH:  Nov 05, 1985   DATE OF ADMISSION:  01/18/2006  DATE OF DISCHARGE:  LH                                HISTORY & PHYSICAL   HISTORY:  This is a 25 year old female with [facility] January 19, 2006, admitted at  term in early labor.  The patient has had prenatal care at the Clinic in  [place], and the record has been reviewed.  The prenatal course has been  uneventful, except for the patient being positive for marijuana (and also  positive on admission here), and being group B strep-positive.  The patient  came to the hospital complaining of pain, and was placed on the monitor,  where a labor pattern was identified.  In the course of observation, the  patient's cervix changed -- making the diagnosis of labor.  The patient is a  reactive NST.   PAST MEDICAL HISTORY:  OBSTETRIC:  The patient had a therapeutic abortion in 2004, and received RhoGAM.   MEDICAL:  Negative.   SURGICAL:  Therapeutic abortion 2004.   DRUG ALLERGIES:  NONE KNOWN.   PRESCRIPTION MEDICATIONS:  Known none.   SOCIAL HISTORY:  The patient is positive for marijuana in the prenatal  course, and smoked marijuana one day prior to admission.   PHYSICAL EXAMINATION:  GENERAL:  The patient appears as a well-developed,  well-nourished term pregnant female, in early labor.  HEART:  Normal sinus rhythm.  No murmurs are heard.  LUNGS:  Clear.  ABDOMEN:  The fetus is presenting vertex, with the head engaged.  The fundus  is term size.  Estimated fetal weight is 6.5 pounds.  There are no surgical  scars on the abdomen.  The uterus is soft and nontender between  contractions.  EXTREMITIES:  Negative.  PELVIC:  The cervix is posterior, completely effaced, 2-3 cm dilated with a  vertex at 0 station.   Membranes were intact.   IMPRESSION:  Intrauterine gestation at 39.6 weeks; early labor.  Group B  strep-positive.  Positive marijuana screen.   DISPOSITION:  Membranes were ruptured, with clear fluid noted.  Stadol 1 mg  has been administered intravenously.  The patient has requested an epidural,  which will be placed shortly.   MANAGEMENT:  Expectant for normal vaginal delivery.                                            ______________________________  Richardean Canal, M.D.     RW/MEDQ  D:  01/18/2006  T:  01/18/2006  Job:  [id number]
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
## 2010-12-21 NOTE — Discharge Summary (Signed)
NAME:  [name], [name]              ACCOUNT NO.:  [id number]   MEDICAL RECORD NO.:  [id number]          PATIENT TYPE:  OBV   LOCATION:  A418                          FACILITY:  [facility]   PHYSICIAN:  Langley Gauss, MD     DATE OF BIRTH:  [birth year]   DATE OF ADMISSION:  06/11/2005  DATE OF DISCHARGE:  11/07/2006LH                                 DISCHARGE SUMMARY   OBSERVATION NOTE:  She presented to [facility] Emergency Room where  she was seen initially by Dr. Margretta Ditty, the emergency room physician, on  June 11, 2005 where she was determined to have a diagnosis of threatened  abortion.  Outpatient consultation performed by Dr. Roylene Reason. Lisette Grinder in the  emergency room upon the request of Dr. Margretta Ditty on same date of service.  An  ultrasound was performed [facility] Department of Radiology which  determined her to have a viable intrauterine pregnancy 8 weeks 2 days.  The  patient was discharged to home at that time with instructions to seek  prenatal care from the [facility] of her choice.      Langley Gauss, MD  Electronically Signed     DC/MEDQ  D:  08/09/2005  T:  08/09/2005  Job:  [id number]   cc:   Rhae Lerner. Margretta Ditty, M.D.  [address]  [place]  [place] [address]

## 2011-04-25 LAB — CBC
HCT: 33.8 — ABNORMAL LOW
Hemoglobin: 11.5 — ABNORMAL LOW
MCHC: 34.3
Platelets: 287
RBC: 3.08 — ABNORMAL LOW
RBC: 3.94
WBC: 12.3 — ABNORMAL HIGH

## 2011-04-25 LAB — TYPE AND SCREEN: Antibody Screen: NEGATIVE

## 2011-04-25 LAB — RAPID HIV SCREEN (WH-MAU): Rapid HIV Screen: NONREACTIVE

## 2011-05-13 LAB — URINALYSIS, ROUTINE W REFLEX MICROSCOPIC
Glucose, UA: NEGATIVE
Protein, ur: NEGATIVE
Specific Gravity, Urine: >1.03 — ABNORMAL HIGH
Urobilinogen, UA: 0.2

## 2011-05-17 LAB — CBC
HCT: 33.8 — ABNORMAL LOW
Hemoglobin: 11.7 — ABNORMAL LOW
MCV: 90.7
RBC: 3.73 — ABNORMAL LOW
WBC: 12.1 — ABNORMAL HIGH

## 2011-05-17 LAB — RPR: RPR Ser Ql: NONREACTIVE

## 2011-05-17 LAB — URINALYSIS, ROUTINE W REFLEX MICROSCOPIC
Ketones, ur: NEGATIVE
Nitrite: NEGATIVE
Protein, ur: NEGATIVE

## 2011-05-17 LAB — TYPE AND SCREEN
ABO/RH(D): O NEG
Antibody Screen: NEGATIVE

## 2011-05-17 LAB — DIFFERENTIAL
Eosinophils Absolute: 0.1
Eosinophils Relative: 1
Lymphs Abs: 1.5
Monocytes Absolute: 0.7
Monocytes Relative: 6

## 2011-05-17 LAB — KLEIHAUER-BETKE STAIN
Fetal Cells %: 0
Quantitation Fetal Hemoglobin: 0

## 2011-05-17 LAB — WET PREP, GENITAL

## 2011-05-22 LAB — URINE MICROSCOPIC-ADD ON

## 2011-05-22 LAB — COMPREHENSIVE METABOLIC PANEL
ALT: 19
AST: 18
Albumin: 4
Alkaline Phosphatase: 60
Calcium: 9.6
GFR calc Af Amer: >60
Glucose, Bld: 97
Potassium: 3.5
Sodium: 137
Total Protein: 7.1

## 2011-05-22 LAB — POCT PREGNANCY, URINE
Operator id: 220991
Preg Test, Ur: POSITIVE

## 2011-05-22 LAB — GC/CHLAMYDIA PROBE AMP, GENITAL: Chlamydia, DNA Probe: NEGATIVE

## 2011-05-22 LAB — CBC
Hemoglobin: 14.2
MCHC: 33.8
Platelets: 317
RDW: 12.9

## 2011-05-22 LAB — URINALYSIS, ROUTINE W REFLEX MICROSCOPIC
Glucose, UA: NEGATIVE
Hgb urine dipstick: NEGATIVE
Specific Gravity, Urine: >1.03 — ABNORMAL HIGH
Urobilinogen, UA: 1

## 2011-05-22 LAB — WET PREP, GENITAL
Trich, Wet Prep: NONE SEEN
Yeast Wet Prep HPF POC: NONE SEEN

## 2011-06-25 ENCOUNTER — Encounter: Payer: Self-pay | Admitting: *Deleted

## 2011-06-25 ENCOUNTER — Emergency Department (HOSPITAL_BASED_OUTPATIENT_CLINIC_OR_DEPARTMENT_OTHER)
Admission: EM | Admit: 2011-06-25 | Discharge: 2011-06-25 | Disposition: A | Discharge status: Home / Self Care | Payer: Self-pay | Attending: Emergency Medicine | Admitting: Emergency Medicine

## 2011-06-25 DIAGNOSIS — E860 Dehydration: Secondary | ICD-10-CM | POA: Insufficient documentation

## 2011-06-25 DIAGNOSIS — A630 Anogenital (venereal) warts: Secondary | ICD-10-CM | POA: Insufficient documentation

## 2011-06-25 DIAGNOSIS — F17200 Nicotine dependence, unspecified, uncomplicated: Secondary | ICD-10-CM | POA: Insufficient documentation

## 2011-06-25 LAB — URINALYSIS, ROUTINE W REFLEX MICROSCOPIC
Bilirubin Urine: NEGATIVE
Ketones, ur: >80 mg/dL — AB
Leukocytes, UA: NEGATIVE
Nitrite: NEGATIVE
Protein, ur: NEGATIVE mg/dL
pH: 6 (ref 5.0–8.0)

## 2011-06-25 LAB — WET PREP, GENITAL: Trich, Wet Prep: NONE SEEN

## 2011-06-25 NOTE — ED Notes (Signed)
Pt presents to ED with multiple female complaints to include "rash" to genital area for the last year.  Pt also c/o low abd pain and flank pain.  Pt has 3 previous c-sections with live births.  Pt has concern for possible pregnancy.

## 2011-06-25 NOTE — ED Provider Notes (Signed)
History     CSN: [id number] Arrival date & time: 06/25/2011 12:31 PM   First MD Initiated Contact with Patient 06/25/11 1247      Chief Complaint  Patient presents with  . Abdominal Pain  . Abscess    (Consider location/radiation/quality/duration/timing/severity/associated sxs/prior treatment) Patient is a 25 y.o. female presenting with abdominal pain. The history is provided by the patient.  Abdominal Pain The primary symptoms of the illness include abdominal pain and fatigue. The primary symptoms of the illness do not include fever, nausea, vomiting, dysuria, vaginal discharge or vaginal bleeding. The current episode started more than 2 days ago.  Symptoms associated with the illness do not include chills, constipation, urgency, hematuria, frequency or back pain.  Pt states she has had lower abdominal pain for few days. States having rash in the vaginal area that is spreading over last two moths. States it looks like bumps that are tender. States also having bumps on and off on  Neck, lower abdomen, buttocks. States was seen about a month ago and was tested for STIs states negative test results for herpes, gonorrhea, chlamydia. Pt state her period last month was very light as well, concerned she may be pregnant. Denies fever, chills, nausea, vomiting, dysuria, vaginal discharge or bleeding. States has been feeling tired recently.   History reviewed. No pertinent past medical history.  Past Surgical History  Procedure Date  . Cesarean section     x3    History reviewed. No pertinent family history.  History  Substance Use Topics  . Smoking status: Current Everyday Smoker -- 0.5 packs/day    Types: Cigarettes  . Smokeless tobacco: Not on file  . Alcohol Use: No    OB History    Grav Para Term Preterm Abortions TAB SAB Ect Mult Living                  Review of Systems  Constitutional: Positive for fatigue. Negative for fever and chills.  HENT: Negative.   Eyes:  Negative.   Respiratory: Negative.   Cardiovascular: Negative.   Gastrointestinal: Positive for abdominal pain. Negative for nausea, vomiting and constipation.  Genitourinary: Positive for vaginal pain. Negative for dysuria, urgency, frequency, hematuria, flank pain, vaginal bleeding, vaginal discharge and menstrual problem.  Musculoskeletal: Negative.  Negative for back pain.  Skin: Positive for rash.  Neurological: Positive for weakness. Negative for headaches.  Psychiatric/Behavioral: Negative.     Allergies  Review of patient's allergies indicates no known allergies.  Home Medications  No current outpatient prescriptions on file.  BP 92/49  Pulse 80  Temp(Src) 98.9 F (37.2 C) (Oral)  Resp 20  SpO2 100%  LMP 06/05/2011  Physical Exam  Constitutional: She is oriented to person, place, and time. She appears well-developed and well-nourished. No distress.  Eyes: Conjunctivae are normal. Pupils are equal, round, and reactive to light.  Neck: Neck supple.  Cardiovascular: Normal rate, regular rhythm and normal heart sounds.   Pulmonary/Chest: Effort normal and breath sounds normal.  Abdominal: Soft. Bowel sounds are normal. There is no tenderness. There is no rebound and no guarding.  Genitourinary:       Genital warts over labia majora bilaterally. Bloody vaginal discharge. Normal appearing cervix. No CMT, no adnexal tenderness or masses  Neurological: She is alert and oriented to person, place, and time.  Skin: Skin is warm and dry.  Psychiatric: She has a normal mood and affect.    ED Course  Procedures (including critical care time)  Results  for orders placed during the hospital encounter of 06/25/11  URINALYSIS, ROUTINE W REFLEX MICROSCOPIC      Component Value Range   Color, Urine YELLOW  YELLOW    Appearance CLEAR  CLEAR    Specific Gravity, Urine 1.029  1.005 - 1.030    pH 6.0  5.0 - 8.0    Glucose, UA NEGATIVE  NEGATIVE (mg/dL)   Hgb urine dipstick NEGATIVE   NEGATIVE    Bilirubin Urine NEGATIVE  NEGATIVE    Ketones, ur >80 (*) NEGATIVE (mg/dL)   Protein, ur NEGATIVE  NEGATIVE (mg/dL)   Urobilinogen, UA 0.2  0.0 - 1.0 (mg/dL)   Nitrite NEGATIVE  NEGATIVE    Leukocytes, UA NEGATIVE  NEGATIVE   PREGNANCY, URINE      Component Value Range   Preg Test, Ur NEGATIVE    WET PREP, GENITAL      Component Value Range   Yeast, Wet Prep NONE SEEN  NONE SEEN    Trich, Wet Prep NONE SEEN  NONE SEEN    Clue Cells, Wet Prep MODERATE (*) NONE SEEN    WBC, Wet Prep HPF POC FEW (*) NONE SEEN    Pt with perineal lesions, exam consistent with genital warts. No CMT. Will d/chome with GYN follow up. Pt not wanting to wait for wet prep to result. Will call if abnormal.      MDM          Lottie Mussel, PA 06/25/11 1638  Lottie Mussel, PA 06/25/11 1639

## 2011-06-25 NOTE — ED Notes (Signed)
Patient stated she has "bumps" on sides of vagina opening, "bumps" on her arms that are not the same, and that she feels she may be pregnant. I notified nurse.

## 2011-06-26 LAB — GC/CHLAMYDIA PROBE AMP, GENITAL
Chlamydia, DNA Probe: NEGATIVE
GC Probe Amp, Genital: NEGATIVE

## 2011-06-27 NOTE — ED Provider Notes (Signed)
Medical screening examination/treatment/procedure(s) were performed by non-physician practitioner and as supervising physician I was immediately available for consultation/collaboration.  Juliet Rude. Rubin Payor, MD 06/27/11 1456

## 2011-07-24 ENCOUNTER — Encounter: Payer: Self-pay | Admitting: Advanced Practice Midwife

## 2011-07-24 ENCOUNTER — Ambulatory Visit (INDEPENDENT_AMBULATORY_CARE_PROVIDER_SITE_OTHER): Payer: Self-pay | Admitting: Advanced Practice Midwife

## 2011-07-24 DIAGNOSIS — N911 Secondary amenorrhea: Secondary | ICD-10-CM

## 2011-07-24 DIAGNOSIS — N760 Acute vaginitis: Secondary | ICD-10-CM

## 2011-07-24 DIAGNOSIS — N912 Amenorrhea, unspecified: Secondary | ICD-10-CM

## 2011-07-24 DIAGNOSIS — A630 Anogenital (venereal) warts: Secondary | ICD-10-CM

## 2011-07-24 DIAGNOSIS — A499 Bacterial infection, unspecified: Secondary | ICD-10-CM

## 2011-07-24 DIAGNOSIS — IMO0002 Reserved for concepts with insufficient information to code with codable children: Secondary | ICD-10-CM

## 2011-07-24 DIAGNOSIS — Z01419 Encounter for gynecological examination (general) (routine) without abnormal findings: Secondary | ICD-10-CM

## 2011-07-24 DIAGNOSIS — Z0000 Encounter for general adult medical examination without abnormal findings: Secondary | ICD-10-CM

## 2011-07-24 MED ORDER — IMIQUIMOD 5 % EX CREA: TOPICAL_CREAM | CUTANEOUS | Status: DC

## 2011-07-24 NOTE — Patient Instructions (Signed)
Human Papillomavirus HPV stands for human papillomavirus. There are many different types of HPV. People of all ages and races can get an HPV infection. In females, some types of HPV can cause warts on the sex organs or anus. Some females never see any warts on the outside, yet still have the infection inside. The doctor will need to check the sex organs and do a Pap test to see there is an HPV infection. The only sign of infection may be a Pap test that is abnormal. A female who has HPV has a higher chance of getting cancer of the sex organs or anus. It is very rare, but some females can give their babies the HPV infection when the baby is being born. Some of these babies can grow warts on the voice box (vocal cords). Males with HPV have a higher chance of getting cancer of the penis or anus. A female may have HPV but not see any warts on his penis or anus. There is no test to find HPV in males, so even if warts are not seen, there may still be an infection. HOME CARE   Take medicines as told by your doctor.   Get needed Pap tests.   Keep follow-up exams.   Do not touch or scratch the warts.   Do not treat warts with medicines used for treating hand warts.   Tell your sex partner about your infection because he or she may also need treatment.   Do not have sex while you are getting treatment.   After treatment, use condoms during sex.   Use over-the-counter creams for itching as told by your doctor.   Use over-the-counter or prescription medicines for pain, discomfort or fever as told by your doctor.   Do not douche or use tampons during treatment of HPV.  GET HELP RIGHT AWAY IF:  You have a temperature by mouth above 102 F (38.9 C), not controlled by medicine. MAKE SURE YOU:   Understand these instructions.   Will watch your condition.   Will get help right away if you are not doing well or get worse.  Document Released: 07/04/2008 Document Revised: 04/03/2011 Document Reviewed:  07/04/2008 [facility] Patient Information 2012 [place], [place].

## 2011-07-24 NOTE — Progress Notes (Signed)
Subjective:     [name] is a 25 y.o. female here for F/U from ED visit 06/25/11 for abd pain and "rash" on perinium. She reports resolution of abd pain, but continued dyspareunia x a few months. GC/CT neg at ED visit. Wet prep showed BV, but pt left prior to results or Tx. The ED provider Dx her w/ Genital Warts and pt is here for Tx. She is very distraught about the Dx and worried about cervical cancer. Requesting Pap. She also reports bring 19 days late for period. LMP 06/06/11. No hx of irreg cycles. Home UPT neg.    Gynecologic History Patient's last menstrual period was 06/06/2011. Contraception: none Last Pap: 2/12. Results were: normal Last mammogram: NA. Results were: NA  Obstetric History OB History    Grav Para Term Preterm Abortions TAB SAB Ect Mult Living                 The following portions of the patient's history were reviewed and updated as appropriate: allergies, current medications, past family history, past medical history, past social history, past surgical history and problem list.  Review of Systems Constitutional: negative for anorexia, chills, fatigue, fevers and weight loss Gastrointestinal: negative for abdominal pain, constipation, diarrhea, nausea and vomiting Genitourinary:negative for vaginal discharge Endocrine: negative for temperature intolerance and hair loss, acne    Objective:    General appearance: alert and anxious, tearful Neck: No thyromegaly Heart: regular rate and rhythm, S1, S2 normal, no murmur, click, rub or gallop Abdomen: soft, non-tender; bowel sounds normal; no masses,  no organomegaly Pelvic: cervix normal in appearance, no adnexal masses or tenderness, no cervical motion tenderness, positive findings: positive whiff test or ~12 genital warts on perineum adn forrchete  and uterus normal size, shape, and consistency Cervix to right  TCA Tx  Patient identified, informed consent signed and copy in chart, time out performed.      Areas of typical appearing genital warts noted perinuem.  Surrounding area coated with water based lubricant and TCA applied until warts had white appearance.   Patient tolerated the procedure well, but sensitive to burning sensation. Resolved within 5 minutes after application   Post procedure instructions given and patient told to wash area in thirty minutes.  Return in 1 week for next treatment.  Assessment:    Healthy female exam.  Pap done Genital Warts, TCA TX #1     Plan:    Follow up in: 1 week.   Rx Aldera (pt will switch if able to afford). Discussed different types of HPV and that those associated w/ genital warts are not associated w/ cervical cancer. Pt info and Tx options discussed.  Quant hcg Declined GC/CT Rx Flagyl for previous Dx Wet Prep  SMITH, [place] 07/24/2011 5:58 PM

## 2011-07-25 LAB — HCG, QUANTITATIVE, PREGNANCY: hCG, Beta Chain, Quant, S: <2 m[IU]/mL

## 2011-07-25 MED ORDER — METRONIDAZOLE 500 MG PO TABS: 500.0000 mg | ORAL_TABLET | Freq: Two times a day (BID) | ORAL | Status: AC

## 2011-08-01 ENCOUNTER — Telehealth: Payer: Self-pay

## 2011-08-01 NOTE — Telephone Encounter (Signed)
Called pt and confirmed her results of pregnancy test negative.  Confirmed Rx and verified pharmacy.  Pt has been unable to get Rx at this time.  Pt had no further questions and stated understanding.

## 2011-08-01 NOTE — Telephone Encounter (Signed)
Message copied by Faythe Casa on Thu Aug 01, 2011 10:43 AM ------      Message from: [name]      Created: Thu Aug 01, 2011  9:35 AM       Pt has been called about appointment on 12/28 @ 8:15 am, but I did not give her any results.            ----- Message -----         From: Dorathy Kinsman, CNM         Sent: 07/25/2011   2:00 PM           To: [facility] Admin Pool            Needs F/U TCA visit in 1 week.      Notify that Flagyl Rx is available at pharmacy for BV Dx at emergency dept.      Pregnancy test neg.

## 2011-08-02 ENCOUNTER — Ambulatory Visit (INDEPENDENT_AMBULATORY_CARE_PROVIDER_SITE_OTHER): Payer: Self-pay | Admitting: Obstetrics & Gynecology

## 2011-08-02 ENCOUNTER — Encounter: Payer: Self-pay | Admitting: Obstetrics & Gynecology

## 2011-08-02 ENCOUNTER — Ambulatory Visit: Payer: Self-pay | Admitting: Obstetrics & Gynecology

## 2011-08-02 VITALS — BP 109/69 | HR 95 | Temp 97.7°F | Ht 59.0 in | Wt 114.4 lb

## 2011-08-02 DIAGNOSIS — A630 Anogenital (venereal) warts: Secondary | ICD-10-CM | POA: Insufficient documentation

## 2011-08-02 DIAGNOSIS — Z23 Encounter for immunization: Secondary | ICD-10-CM

## 2011-08-02 MED ORDER — HPV QUADRIVALENT VACCINE IM SUSP
0.5000 mL | Freq: Once | INTRAMUSCULAR | Status: AC
  Administered 2011-08-02: 0.5 mL via INTRAMUSCULAR

## 2011-08-02 NOTE — Progress Notes (Signed)
  Subjective:    Patient ID: [name], female    DOB: 01/06/1986, 25 y.o.   MRN: [id number]  HPIPatient's last menstrual period was 07/26/2011. Patient returns today after being treated with TCA for genital warts one week ago. She said she had a considerable amount of staining after the procedure. This has improved but she still has some symptoms. This has not filled her prescription for Flagyl or Aldara yet. Current Outpatient Prescriptions on File Prior to Visit  Medication Sig Dispense Refill  . imiquimod (ALDARA) 5 % cream Apply topically 3 (three) times a week. Apply at bed time and remove in morning.  2 each  10  . metroNIDAZOLE (FLAGYL) 500 MG tablet Take 1 tablet (500 mg total) by mouth 2 (two) times daily.  14 tablet  0   No current facility-administered medications on file prior to visit.   Past Medical History  Diagnosis Date  . Genital warts    Allergies  Allergen Reactions  . Latex Swelling and Other (See Comments)    redness       Review of Systems     Objective:   Physical Exam  Pelvic: No visible warts noted. There's still some erythema from her recent TCA treatment.      Assessment & Plan:  Good response to TCA treatment for genital warts. She can return if she feels she becomes symptomatic again. If the warts to recur I would recommend Aldara treatment then. Urge tear to fill the prescription for Flagyl to treat bacterial vaginosis if she becomes symptomatic.  Dr. Scheryl Darter 08/02/2011

## 2011-09-13 ENCOUNTER — Ambulatory Visit: Payer: Self-pay

## 2011-11-18 ENCOUNTER — Ambulatory Visit: Payer: Self-pay | Admitting: Advanced Practice Midwife

## 2011-11-21 ENCOUNTER — Ambulatory Visit (INDEPENDENT_AMBULATORY_CARE_PROVIDER_SITE_OTHER): Payer: Self-pay | Admitting: Physician Assistant

## 2011-11-21 ENCOUNTER — Encounter: Payer: Self-pay | Admitting: Physician Assistant

## 2011-11-21 VITALS — BP 99/60 | HR 92 | Temp 99.4°F | Ht 59.0 in | Wt 115.8 lb

## 2011-11-21 DIAGNOSIS — F329 Major depressive disorder, single episode, unspecified: Secondary | ICD-10-CM | POA: Insufficient documentation

## 2011-11-21 DIAGNOSIS — F411 Generalized anxiety disorder: Secondary | ICD-10-CM

## 2011-11-21 DIAGNOSIS — F32A Depression, unspecified: Secondary | ICD-10-CM | POA: Insufficient documentation

## 2011-11-21 DIAGNOSIS — A630 Anogenital (venereal) warts: Secondary | ICD-10-CM

## 2011-11-21 DIAGNOSIS — F419 Anxiety disorder, unspecified: Secondary | ICD-10-CM

## 2011-11-21 HISTORY — DX: Anxiety disorder, unspecified: F41.9

## 2011-11-21 MED ORDER — ALPRAZOLAM 0.5 MG PO TABS: 0.2500 mg | ORAL_TABLET | Freq: Three times a day (TID) | ORAL | Status: DC | PRN

## 2011-11-21 MED ORDER — SERTRALINE HCL 25 MG PO TABS: 25.0000 mg | ORAL_TABLET | Freq: Every day | ORAL | Status: DC

## 2011-11-21 NOTE — Patient Instructions (Addendum)
Genital Warts Genital warts are a sexually transmitted infection. They may appear as small bumps on the tissues of the genital area. CAUSES  Genital warts are caused by a virus called human papillomavirus (HPV). HPV is the most common sexually transmitted disease (STD) and infection of the sex organs. This infection is spread by having unprotected sex with an infected person. It can be spread by vaginal, anal, and oral sex. Many people do not know they are infected. They may be infected for years without problems. However, even if they do not have problems, they can unknowingly pass the infection to their sexual partners. SYMPTOMS   Itching and irritation in the genital area.   Warts that bleed.   Painful sexual intercourse.  DIAGNOSIS  Warts are usually recognized with the naked eye on the vagina, vulva, perineum, anus, and rectum. Certain tests can also diagnose genital warts, such as:  A Pap test.   A tissue sample (biopsy) exam.   Colposcopy. A magnifying tool is used to examine the vagina and cervix. The HPV cells will change color when certain solutions are used.  TREATMENT  Warts can be removed by:  Applying certain chemicals, such as cantharidin or podophyllin.   Liquid nitrogen freezing (cryotherapy).   Immunotherapy with candida or trichophyton injections.   Laser treatment.   Burning with an electrified probe (electrocautery).   Interferon injections.   Surgery.  PREVENTION  HPV vaccination can help prevent HPV infections that cause genital warts and that cause cancer of the cervix. It is recommended that the vaccination be given to people between the ages 48 to 72 years old. The vaccine might not work as well or might not work at all if you already have HPV. It should not be given to pregnant women. HOME CARE INSTRUCTIONS   It is important to follow your caregiver's instructions. The warts will not go away without treatment. Repeat treatments are often needed to get  rid of warts. Even after it appears that the warts are gone, the normal tissue underneath often remains infected.   Do not try to treat genital warts with medicine used to treat hand warts. This type of medicine is strong and can burn the skin in the genital area, causing more damage.   Tell your past and current sexual partner(s) that you have genital warts. They may be infected also and need treatment.   Avoid sexual contact while being treated.   Do not touch or scratch the warts. The infection may spread to other parts of your body.   Women with genital warts should have a cervical cancer check (Pap test) at least once a year. This type of cancer is slow-growing and can be cured if found early. Chances of developing cervical cancer are increased with HPV.   Inform your obstetrician about your warts in the event of pregnancy. This virus can be passed to the baby's respiratory tract. Discuss this with your caregiver.   Use a condom during sexual intercourse. Following treatment, the use of condoms will help prevent reinfection.   Ask your caregiver about using over-the-counter anti-itch creams.  SEEK MEDICAL CARE IF:   Your treated skin becomes red, swollen, or painful.   You have a fever.   You feel generally ill.   You feel little lumps in and around your genital area.   You are bleeding or have painful sexual intercourse.  MAKE SURE YOU:   Understand these instructions.   Will watch your condition.   Will  get help right away if you are not doing well or get worse.  Document Released: 07/19/2000 Document Revised: 07/11/2011 Document Reviewed: 01/28/2011 [facility] Patient Information 2012 [place], [place].Anxiety and Panic Attacks Your caregiver has informed you that you are having an anxiety or panic attack. There may be many forms of this. Most of the time these attacks come suddenly and without warning. They come at any time of day, including periods of sleep, and at any time  of life. They may be strong and unexplained. Although panic attacks are very scary, they are physically harmless. Sometimes the cause of your anxiety is not known. Anxiety is a protective mechanism of the body in its fight or flight mechanism. Most of these perceived danger situations are actually nonphysical situations (such as anxiety over losing a job). CAUSES  The causes of an anxiety or panic attack are many. Panic attacks may occur in otherwise healthy people given a certain set of circumstances. There may be a genetic cause for panic attacks. Some medications may also have anxiety as a side effect. SYMPTOMS  Some of the most common feelings are:  Intense terror.   Dizziness, feeling faint.   Hot and cold flashes.   Fear of going crazy.   Feelings that nothing is real.   Sweating.   Shaking.   Chest pain or a fast heartbeat (palpitations).   Smothering, choking sensations.   Feelings of impending doom and that death is near.   Tingling of extremities, this may be from over-breathing.   Altered reality (derealization).   Being detached from yourself (depersonalization).  Several symptoms can be present to make up anxiety or panic attacks. DIAGNOSIS  The evaluation by your caregiver will depend on the type of symptoms you are experiencing. The diagnosis of anxiety or panic attack is made when no physical illness can be determined to be a cause of the symptoms. TREATMENT  Treatment to prevent anxiety and panic attacks may include:  Avoidance of circumstances that cause anxiety.   Reassurance and relaxation.   Regular exercise.   Relaxation therapies, such as yoga.   Psychotherapy with a psychiatrist or therapist.   Avoidance of caffeine, alcohol and illegal drugs.   Prescribed medication.  SEEK IMMEDIATE MEDICAL CARE IF:   You experience panic attack symptoms that are different than your usual symptoms.   You have any worsening or concerning symptoms.    Document Released: 07/22/2005 Document Revised: 07/11/2011 Document Reviewed: 11/23/2009 [facility] Patient Information 2012 [place], [place].

## 2011-11-21 NOTE — Progress Notes (Signed)
Pt presents for repeat treatment of TCA for genital warts. Last tx in 07/2011 and responded well. Unable to fill Aldara Rx due to expense. Also, reports increased anxiety and panic attacks daily. States she has previously been on xanax and zoloft and would like to restart meds. Pt denies suicidial ideations or harm to others.   Patient identified, informed consent signed and copy in chart, time out performed.    Areas of typical appearing genital warts noted at the introitus, ~ 12 lesions.  Surrounding area coated with water based lubricant and TCA applied until warts had white appearance.   Patient tolerated the procedure well.    Post procedure instructions given and patient told to wash area in thirty minutes.  1. Anxiety   2. Genital warts     RTC in 3-4 weeks for follow up of TCA and re-check of anxiety response to meds. Pt understands Xanax is only bridge to therapeutic level of daily med.  Trinna Kunst E. 11/21/2011 2:20 PM

## 2011-12-19 ENCOUNTER — Encounter: Payer: Self-pay | Admitting: Physician Assistant

## 2011-12-19 ENCOUNTER — Ambulatory Visit (INDEPENDENT_AMBULATORY_CARE_PROVIDER_SITE_OTHER): Payer: Self-pay | Admitting: Physician Assistant

## 2011-12-19 DIAGNOSIS — A630 Anogenital (venereal) warts: Secondary | ICD-10-CM

## 2011-12-19 MED ORDER — SERTRALINE HCL 25 MG PO TABS: 50.0000 mg | ORAL_TABLET | Freq: Every day | ORAL | Status: DC

## 2011-12-19 NOTE — Patient Instructions (Signed)
Genital Warts Genital warts are a sexually transmitted infection. They may appear as small bumps on the tissues of the genital area. CAUSES  Genital warts are caused by a virus called human papillomavirus (HPV). HPV is the most common sexually transmitted disease (STD) and infection of the sex organs. This infection is spread by having unprotected sex with an infected person. It can be spread by vaginal, anal, and oral sex. Many people do not know they are infected. They may be infected for years without problems. However, even if they do not have problems, they can unknowingly pass the infection to their sexual partners. SYMPTOMS   Itching and irritation in the genital area.   Warts that bleed.   Painful sexual intercourse.  DIAGNOSIS  Warts are usually recognized with the naked eye on the vagina, vulva, perineum, anus, and rectum. Certain tests can also diagnose genital warts, such as:  A Pap test.   A tissue sample (biopsy) exam.   Colposcopy. A magnifying tool is used to examine the vagina and cervix. The HPV cells will change color when certain solutions are used.  TREATMENT  Warts can be removed by:  Applying certain chemicals, such as cantharidin or podophyllin.   Liquid nitrogen freezing (cryotherapy).   Immunotherapy with candida or trichophyton injections.   Laser treatment.   Burning with an electrified probe (electrocautery).   Interferon injections.   Surgery.  PREVENTION  HPV vaccination can help prevent HPV infections that cause genital warts and that cause cancer of the cervix. It is recommended that the vaccination be given to people between the ages 9 to 26 years old. The vaccine might not work as well or might not work at all if you already have HPV. It should not be given to pregnant women. HOME CARE INSTRUCTIONS   It is important to follow your caregiver's instructions. The warts will not go away without treatment. Repeat treatments are often needed to get  rid of warts. Even after it appears that the warts are gone, the normal tissue underneath often remains infected.   Do not try to treat genital warts with medicine used to treat hand warts. This type of medicine is strong and can burn the skin in the genital area, causing more damage.   Tell your past and current sexual partner(s) that you have genital warts. They may be infected also and need treatment.   Avoid sexual contact while being treated.   Do not touch or scratch the warts. The infection may spread to other parts of your body.   Women with genital warts should have a cervical cancer check (Pap test) at least once a year. This type of cancer is slow-growing and can be cured if found early. Chances of developing cervical cancer are increased with HPV.   Inform your obstetrician about your warts in the event of pregnancy. This virus can be passed to the baby's respiratory tract. Discuss this with your caregiver.   Use a condom during sexual intercourse. Following treatment, the use of condoms will help prevent reinfection.   Ask your caregiver about using over-the-counter anti-itch creams.  SEEK MEDICAL CARE IF:   Your treated skin becomes red, swollen, or painful.   You have a fever.   You feel generally ill.   You feel little lumps in and around your genital area.   You are bleeding or have painful sexual intercourse.  MAKE SURE YOU:   Understand these instructions.   Will watch your condition.   Will   get help right away if you are not doing well or get worse.  Document Released: 07/19/2000 Document Revised: 07/11/2011 Document Reviewed: 01/28/2011 [employer] Patient Information 2012 [employer]. 

## 2011-12-19 NOTE — Progress Notes (Signed)
Patient identified, informed consent signed and copy in chart, time out performed.    Areas of typical appearing genital warts noted at introitus.  Surrounding area coated with water based lubricant and TCA applied until warts had white appearance.   Patient tolerated the procedure well.    Post procedure instructions given and patient told to wash area in thirty minutes.  Return in 1 week for next treatment.   Earlyne Feeser E. 12/19/2011 2:07 PM

## 2011-12-19 NOTE — Progress Notes (Signed)
States has started xanax and zoloft , states it is helping  some, trying to take xanax bid only,. Doesn't want to get dependent on meds- states works best when take zoloft and xanax together at bedtime.  But does c/o headaches that started about 2 weeks, having headaches daily, not taking meds for relief- just tries to rest.  States hasn't gotten other prescriptions filled yet due to cost, but plans to get one filled today.

## 2012-01-15 ENCOUNTER — Ambulatory Visit: Payer: Self-pay | Admitting: Physician Assistant

## 2012-01-16 ENCOUNTER — Ambulatory Visit: Payer: Self-pay | Admitting: Physician Assistant

## 2012-04-17 ENCOUNTER — Ambulatory Visit: Payer: Self-pay | Admitting: Advanced Practice Midwife

## 2012-05-12 ENCOUNTER — Encounter (HOSPITAL_COMMUNITY): Payer: Self-pay | Admitting: *Deleted

## 2012-05-12 ENCOUNTER — Inpatient Hospital Stay (HOSPITAL_COMMUNITY)
Admission: AD | Admit: 2012-05-12 | Discharge: 2012-05-12 | Disposition: A | Discharge status: Home / Self Care | Payer: Self-pay | Source: Ambulatory Visit | Attending: Obstetrics & Gynecology | Admitting: Obstetrics & Gynecology

## 2012-05-12 DIAGNOSIS — B349 Viral infection, unspecified: Secondary | ICD-10-CM

## 2012-05-12 DIAGNOSIS — R5381 Other malaise: Secondary | ICD-10-CM | POA: Insufficient documentation

## 2012-05-12 DIAGNOSIS — R42 Dizziness and giddiness: Secondary | ICD-10-CM | POA: Insufficient documentation

## 2012-05-12 DIAGNOSIS — B9789 Other viral agents as the cause of diseases classified elsewhere: Secondary | ICD-10-CM | POA: Insufficient documentation

## 2012-05-12 HISTORY — DX: Adverse effect of unspecified anesthetic, initial encounter: T41.45XA

## 2012-05-12 HISTORY — DX: Other complications of anesthesia, initial encounter: T88.59XA

## 2012-05-12 HISTORY — DX: Anemia, unspecified: D64.9

## 2012-05-12 LAB — BASIC METABOLIC PANEL
CO2: 26 mEq/L (ref 19–32)
Calcium: 9.3 mg/dL (ref 8.4–10.5)
Chloride: 102 mEq/L (ref 96–112)
Creatinine, Ser: 0.72 mg/dL (ref 0.50–1.10)
Glucose, Bld: 112 mg/dL — ABNORMAL HIGH (ref 70–99)

## 2012-05-12 LAB — URINALYSIS, ROUTINE W REFLEX MICROSCOPIC
Bilirubin Urine: NEGATIVE
Hgb urine dipstick: NEGATIVE
Nitrite: NEGATIVE
Protein, ur: NEGATIVE mg/dL
Specific Gravity, Urine: 1.015 (ref 1.005–1.030)
Urobilinogen, UA: 0.2 mg/dL (ref 0.0–1.0)

## 2012-05-12 LAB — CBC
HCT: 37 % (ref 36.0–46.0)
MCHC: 33.5 g/dL (ref 30.0–36.0)
MCV: 89.2 fL (ref 78.0–100.0)
Platelets: 320 10*3/uL (ref 150–400)
RDW: 13.6 % (ref 11.5–15.5)

## 2012-05-12 NOTE — Progress Notes (Signed)
Pt denies pain but does admit to feeling weak and dizzy.

## 2012-05-12 NOTE — MAU Note (Addendum)
Had 2 episodes of bleeding last month. Stopped on 05/09/2012. Since then has felt dizzy, lightheaded, N/V, HA. Has taken Tylenol and Ibuprofen and nothing has helped. Also C/O breast tenderness, more on L.

## 2012-05-12 NOTE — MAU Provider Note (Signed)
CC: Nausea and Dizziness    First Provider Initiated Contact with Patient 05/12/12 1753      HPI: [name] ia a 26 y.o. [id number] who presents with 3 day history of feeling lightheaded, dizzy, tired, and weak. No history syncope or seizure. No previous episodes. She's also had a headache which responds to Tylenol or ibuprofen but does not have a headache now. Concerned that she had 2 menstrual periods last month and that they were very heavy, saturating 1 super tampon in 2 hrs. Cycles are regular at the beginning of every month, but last month she began September 2 and bled for days then next cycle was September 29 through October 4. Same-sex partner for 3 years in mutually monogamous relationship. Uses withdrawal for contraception. Denies irritative vaginal discharge. Denies dysuria, frequency or urgency of urination, hematuria.  Past Medical History  Diagnosis Date  . Genital warts   . Anxiety 11/21/2011  . Anemia   . Complication of anesthesia     Swelling from some drug used for anesthesia. Does not  know name.  . Genital warts     OB History    Grav Para Term Preterm Abortions TAB SAB Ect Mult Living   7 3  3 4 1 3   3      # Outc Date GA Lbr Len/2nd Wgt Sex Del Anes PTL Lv   1 TAB            2 SAB            3 SAB            4 SAB            5 PRE            6 PRE            7 PRE               Past Surgical History  Procedure Date  . Fractured finger 2001    open reduction  . Therapeutic abortion   . Cesarean section 2007, 2009, 2010    x3    History   Social History  . Marital Status: Single    Spouse Name: N/A    Number of Children: N/A  . Years of Education: N/A   Occupational History  . Not on file.   Social History Main Topics  . Smoking status: Current Every Day Smoker -- 0.5 packs/day for 8 years    Types: Cigarettes  . Smokeless tobacco: Never Used  . Alcohol Use: Yes     socially  . Drug Use: No  . Sexually Active: Yes    Birth  Control/ Protection: None     Last intercourse this morning   Other Topics Concern  . Not on file   Social History Narrative  . No narrative on file    No current facility-administered medications on file prior to encounter.   Current Outpatient Prescriptions on File Prior to Encounter  Medication Sig Dispense Refill  . sertraline (ZOLOFT) 25 MG tablet Take 2 tablets (50 mg total) by mouth daily.  60 tablet  2    Allergies  Allergen Reactions  . Latex Swelling and Other (See Comments)    redness    ROS: Pertinent items in HPI  PHYSICAL EXAM General: Well nourished, well developed female in no acute distress Cardiovascular: Normal rate Respiratory: Normal effort Abdomen: Soft, nontender Back: No CVAT Extremities: No edema Neurologic: Alert and oriented  Speculum exam: NEFG; vagina with physiologic discharge, no blood; cervix clean Bimanual exam: cervix closed, no CMT; uterus NSSP; no adnexal tenderness or masses  LAB RESULTS Results for orders placed during the hospital encounter of 05/12/12 (from the past 24 hour(s))  URINALYSIS, ROUTINE W REFLEX MICROSCOPIC     Status: Abnormal   Collection Time   05/12/12  4:46 PM      Component Value Range   Color, Urine YELLOW  YELLOW   APPearance CLOUDY (*) CLEAR   Specific Gravity, Urine 1.015  1.005 - 1.030   pH 8.0  5.0 - 8.0   Glucose, UA NEGATIVE  NEGATIVE mg/dL   Hgb urine dipstick NEGATIVE  NEGATIVE   Bilirubin Urine NEGATIVE  NEGATIVE   Ketones, ur NEGATIVE  NEGATIVE mg/dL   Protein, ur NEGATIVE  NEGATIVE mg/dL   Urobilinogen, UA 0.2  0.0 - 1.0 mg/dL   Nitrite NEGATIVE  NEGATIVE   Leukocytes, UA NEGATIVE  NEGATIVE  POCT PREGNANCY, URINE     Status: Normal   Collection Time   05/12/12  4:54 PM      Component Value Range   Preg Test, Ur NEGATIVE  NEGATIVE    IMAGING No results found.  MAU COURSE  ASSESSMENT No diagnosis found.  PLAN DIscharge home. See AVS for patient education   Medication List      As of 05/12/2012  5:59 PM    ASK your doctor about these medications         sertraline 25 MG tablet   Commonly known as: ZOLOFT   Take 2 tablets (50 mg total) by mouth daily.         Danae Orleans, CNM 05/12/2012 5:59 PM  Care transferred to Truitt Merle, MD. CBC and CMP pending.    05/12/12 18:50 PM Assumed care of patient. Pt would like to go home. Still feeling slightly dizzy but BMP normal. CBC shows WBC 16.4, hemoglobin of 124.  Normal orthostatics. She did stop taking Zoloft about a month ago but states that she was not taking it regularly, so this is unlikely to be the cause of her malaise.  Discussed with patient that this is likely viral illness. She has appointment in [facility] clinic on Thursday. Patient stable, discharged home.  Napoleon Form, MD

## 2012-05-12 NOTE — MAU Provider Note (Signed)
History     CSN: [id number]  Arrival date and time: 05/12/12 1627   First Provider Initiated Contact with Patient 05/12/12 1753       HPI  See note by D. Poe, CNM same day    ROS See note by D. Poe, CNM, same date Physical Exam   Blood pressure 96/62, pulse 60, temperature 99 F (37.2 C), temperature source Oral, resp. rate 18, height 4\' 11"  (1.499 m), weight 53.524 kg (118 lb), last menstrual period 05/03/2012, SpO2 100.00%.  Physical Exam See note by D. Poe, CNM, same date MAU Course  Procedures:  NONE  Results for orders placed during the hospital encounter of 05/12/12 (from the past 24 hour(s))  URINALYSIS, ROUTINE W REFLEX MICROSCOPIC     Status: Abnormal   Collection Time   05/12/12  4:46 PM      Component Value Range   Color, Urine YELLOW  YELLOW   APPearance CLOUDY (*) CLEAR   Specific Gravity, Urine 1.015  1.005 - 1.030   pH 8.0  5.0 - 8.0   Glucose, UA NEGATIVE  NEGATIVE mg/dL   Hgb urine dipstick NEGATIVE  NEGATIVE   Bilirubin Urine NEGATIVE  NEGATIVE   Ketones, ur NEGATIVE  NEGATIVE mg/dL   Protein, ur NEGATIVE  NEGATIVE mg/dL   Urobilinogen, UA 0.2  0.0 - 1.0 mg/dL   Nitrite NEGATIVE  NEGATIVE   Leukocytes, UA NEGATIVE  NEGATIVE  POCT PREGNANCY, URINE     Status: Normal   Collection Time   05/12/12  4:54 PM      Component Value Range   Preg Test, Ur NEGATIVE  NEGATIVE  CBC     Status: Abnormal   Collection Time   05/12/12  5:40 PM      Component Value Range   WBC 16.3 (*) 4.0 - 10.5 K/uL   RBC 4.15  3.87 - 5.11 MIL/uL   Hemoglobin 12.4  12.0 - 15.0 g/dL   HCT 11.9  14.7 - 82.9 %   MCV 89.2  78.0 - 100.0 fL   MCH 29.9  26.0 - 34.0 pg   MCHC 33.5  30.0 - 36.0 g/dL   RDW 56.2  13.0 - 86.5 %   Platelets 320  150 - 400 K/uL  BASIC METABOLIC PANEL     Status: Abnormal   Collection Time   05/12/12  5:40 PM      Component Value Range   Sodium 138  135 - 145 mEq/L   Potassium 3.7  3.5 - 5.1 mEq/L   Chloride 102  96 - 112 mEq/L   CO2 26  19 - 32  mEq/L   Glucose, Bld 112 (*) 70 - 99 mg/dL   BUN 15  6 - 23 mg/dL   Creatinine, Ser 7.84  0.50 - 1.10 mg/dL   Calcium 9.3  8.4 - 69.6 mg/dL   GFR calc non Af Amer >90  >90 mL/min   GFR calc Af Amer >90  >90 mL/min    Filed Vitals:   05/12/12 1848 05/12/12 1851 05/12/12 1853 05/12/12 1856  BP: 100/51 96/62 94/54  97/49  Pulse: 58 60 73 82  Temp:    98.8 F (37.1 C)  TempSrc:    Oral  Resp:      Height:      Weight:      SpO2:          Assessment and Plan  26 y.o. female with dizziness/weakness. No anemia, normal metabolic panel, not orthostatic Likely viral  syndrome.  Rest, fluids, supportive care. Pt has appt at [facility] gyn clinic in 2 days.  Napoleon Form, MD     Napoleon Form 05/12/2012, 6:53 PM

## 2012-05-14 ENCOUNTER — Encounter: Payer: Self-pay | Admitting: Obstetrics & Gynecology

## 2012-05-14 ENCOUNTER — Ambulatory Visit (INDEPENDENT_AMBULATORY_CARE_PROVIDER_SITE_OTHER): Payer: Self-pay | Admitting: Obstetrics & Gynecology

## 2012-05-14 VITALS — BP 90/60 | HR 91 | Temp 99.9°F | Ht 59.0 in | Wt 119.3 lb

## 2012-05-14 DIAGNOSIS — Z01419 Encounter for gynecological examination (general) (routine) without abnormal findings: Secondary | ICD-10-CM

## 2012-05-14 NOTE — Patient Instructions (Signed)

## 2012-05-14 NOTE — Progress Notes (Signed)
Subjective:     [name] is a 26 y.o. female here for a routine exam.  Current complaints:pt c/o feeling weak and dizzy.  Feels 'weird' and has URI sx but, her fatigue proceeded her URI sx.  Pt was seen in MAU 2 days previously.  Pt does not have a primary care physician.   Gynecologic History Patient's last menstrual period was 05/03/2012. Contraception: none Last Pap: 2010. Results were: normal  Obstetric History OB History    Grav Para Term Preterm Abortions TAB SAB Ect Mult Living   7 3  3 4 1 3   3      # Outc Date GA Lbr Len/2nd Wgt Sex Del Anes PTL Lv   1 TAB            2 SAB            3 SAB            4 SAB            5 PRE            6 PRE            7 PRE                The following portions of the patient's history were reviewed and updated as appropriate: allergies, current medications, past family history, past medical history, past social history, past surgical history and problem list.  Review of Systems A comprehensive review of systems was negative.    Objective:    BP 90/60  Pulse 91  Temp 99.9 F (37.7 C) (Oral)  Ht 4\' 11"  (1.499 m)  Wt 119 lb 4.8 oz (54.114 kg)  BMI 24.10 kg/m2  LMP 05/03/2012  General Appearance:    Alert, cooperative, no distress, appears stated age  Head:    Normocephalic, without obvious abnormality, atraumatic           Throat:   Lips, mucosa, and tongue normal; teeth and gums normal  Neck:   Supple, symmetrical, trachea midline, no adenopathy;    thyroid:  no enlargement/tenderness/nodules; no carotid   bruit or JVD  Back:     Symmetric, no curvature, ROM normal, no CVA tenderness  Lungs:     Clear to auscultation bilaterally, respirations unlabored  Chest Wall:    No tenderness or deformity   Heart:    Regular rate and rhythm, S1 and S2 normal, no murmur, rub   or gallop  Breast Exam:    No tenderness, masses, or nipple abnormality  Abdomen:     Soft, non-tender, bowel sounds active all four quadrants,    no  masses, no organomegaly  Genitalia:    Normal female without lesion, discharge or tenderness  GU: EGBUS: no lesions Vagina: no blood in vault Cervix: no lesion; no mucopurulent d/c Uterus: small, mobile Adnexa: no masses; sl tender       Extremities:   Extremities normal, atraumatic, no cyanosis or edema  Pulses:   2+ and symmetric all extremities  Skin:   Skin color, texture, turgor normal, no rashes or lesions  Lymph nodes:   Cervical, supraclavicular, and axillary nodes normal         Assessment:    Healthy female exam.  No etiology identified for fatigue- rec f/u with primary care   Plan:    Contraception: pt declined.   Recommend f/u with primary care physician F/u PAP and cx  Claron Rosencrans L. Harraway-Smith, M.D., [place]

## 2012-05-21 ENCOUNTER — Ambulatory Visit: Payer: Self-pay | Admitting: Advanced Practice Midwife

## 2012-09-19 ENCOUNTER — Other Ambulatory Visit: Payer: Self-pay

## 2013-06-10 ENCOUNTER — Other Ambulatory Visit: Payer: Self-pay

## 2013-12-17 ENCOUNTER — Encounter (HOSPITAL_BASED_OUTPATIENT_CLINIC_OR_DEPARTMENT_OTHER): Payer: Self-pay | Admitting: Emergency Medicine

## 2013-12-17 ENCOUNTER — Emergency Department (HOSPITAL_BASED_OUTPATIENT_CLINIC_OR_DEPARTMENT_OTHER): Payer: Self-pay

## 2013-12-17 ENCOUNTER — Emergency Department (HOSPITAL_BASED_OUTPATIENT_CLINIC_OR_DEPARTMENT_OTHER)
Admission: EM | Admit: 2013-12-17 | Discharge: 2013-12-18 | Disposition: A | Discharge status: Home / Self Care | Payer: Self-pay | Attending: Emergency Medicine | Admitting: Emergency Medicine

## 2013-12-17 DIAGNOSIS — R5383 Other fatigue: Secondary | ICD-10-CM

## 2013-12-17 DIAGNOSIS — Z91040 Latex allergy status: Secondary | ICD-10-CM | POA: Insufficient documentation

## 2013-12-17 DIAGNOSIS — F411 Generalized anxiety disorder: Secondary | ICD-10-CM | POA: Insufficient documentation

## 2013-12-17 DIAGNOSIS — R55 Syncope and collapse: Secondary | ICD-10-CM | POA: Insufficient documentation

## 2013-12-17 DIAGNOSIS — F438 Other reactions to severe stress: Secondary | ICD-10-CM | POA: Insufficient documentation

## 2013-12-17 DIAGNOSIS — F4389 Other reactions to severe stress: Secondary | ICD-10-CM | POA: Insufficient documentation

## 2013-12-17 DIAGNOSIS — A630 Anogenital (venereal) warts: Secondary | ICD-10-CM | POA: Insufficient documentation

## 2013-12-17 DIAGNOSIS — F17200 Nicotine dependence, unspecified, uncomplicated: Secondary | ICD-10-CM | POA: Insufficient documentation

## 2013-12-17 DIAGNOSIS — H538 Other visual disturbances: Secondary | ICD-10-CM | POA: Insufficient documentation

## 2013-12-17 DIAGNOSIS — R5381 Other malaise: Secondary | ICD-10-CM | POA: Insufficient documentation

## 2013-12-17 DIAGNOSIS — D649 Anemia, unspecified: Secondary | ICD-10-CM | POA: Insufficient documentation

## 2013-12-17 DIAGNOSIS — F439 Reaction to severe stress, unspecified: Secondary | ICD-10-CM

## 2013-12-17 DIAGNOSIS — R202 Paresthesia of skin: Secondary | ICD-10-CM

## 2013-12-17 DIAGNOSIS — R209 Unspecified disturbances of skin sensation: Secondary | ICD-10-CM | POA: Insufficient documentation

## 2013-12-17 LAB — CBC WITH DIFFERENTIAL/PLATELET
BASOS ABS: 0 10*3/uL (ref 0.0–0.1)
BASOS PCT: 0 % (ref 0–1)
Eosinophils Absolute: 0.1 10*3/uL (ref 0.0–0.7)
Eosinophils Relative: 1 % (ref 0–5)
HCT: 40.4 % (ref 36.0–46.0)
HEMOGLOBIN: 13.8 g/dL (ref 12.0–15.0)
LYMPHS PCT: 34 % (ref 12–46)
Lymphs Abs: 4.4 10*3/uL — ABNORMAL HIGH (ref 0.7–4.0)
MCH: 30.6 pg (ref 26.0–34.0)
MCHC: 34.2 g/dL (ref 30.0–36.0)
MCV: 89.6 fL (ref 78.0–100.0)
MONOS PCT: 9 % (ref 3–12)
Monocytes Absolute: 1.2 10*3/uL — ABNORMAL HIGH (ref 0.1–1.0)
NEUTROS ABS: 7.2 10*3/uL (ref 1.7–7.7)
NEUTROS PCT: 55 % (ref 43–77)
Platelets: 357 10*3/uL (ref 150–400)
RBC: 4.51 MIL/uL (ref 3.87–5.11)
RDW: 13.6 % (ref 11.5–15.5)
WBC: 13 10*3/uL — AB (ref 4.0–10.5)

## 2013-12-17 LAB — URINALYSIS, ROUTINE W REFLEX MICROSCOPIC
Bilirubin Urine: NEGATIVE
Glucose, UA: NEGATIVE mg/dL
Hgb urine dipstick: NEGATIVE
Ketones, ur: 15 mg/dL — AB
LEUKOCYTES UA: NEGATIVE
NITRITE: NEGATIVE
PH: 5.5 (ref 5.0–8.0)
Protein, ur: NEGATIVE mg/dL
SPECIFIC GRAVITY, URINE: 1.031 — AB (ref 1.005–1.030)
Urobilinogen, UA: 1 mg/dL (ref 0.0–1.0)

## 2013-12-17 LAB — BASIC METABOLIC PANEL
BUN: 12 mg/dL (ref 6–23)
CHLORIDE: 102 meq/L (ref 96–112)
CO2: 26 mEq/L (ref 19–32)
Calcium: 9.8 mg/dL (ref 8.4–10.5)
Creatinine, Ser: 0.8 mg/dL (ref 0.50–1.10)
GFR calc non Af Amer: >90 mL/min (ref >90)
Glucose, Bld: 87 mg/dL (ref 70–99)
POTASSIUM: 3.4 meq/L — AB (ref 3.7–5.3)
Sodium: 142 mEq/L (ref 137–147)

## 2013-12-17 LAB — PREGNANCY, URINE: Preg Test, Ur: NEGATIVE

## 2013-12-17 MED ORDER — SODIUM CHLORIDE 0.9 % IV BOLUS (SEPSIS)
1000.0000 mL | Freq: Once | INTRAVENOUS | Status: AC
  Administered 2013-12-17: 1000 mL via INTRAVENOUS

## 2013-12-17 NOTE — ED Notes (Signed)
Pt c/o h/a with syncopal episode x 3 days  Right arm numbness
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
## 2013-12-17 NOTE — ED Notes (Signed)
Pt states unintentional weight loss of about 10 lbs (estimate) in past week.  Normal appetite.

## 2013-12-18 NOTE — Discharge Instructions (Signed)
Fatigue °Fatigue is a feeling of tiredness, lack of energy, lack of motivation, or feeling tired all the time. Having enough rest, good nutrition, and reducing stress will normally reduce fatigue. Consult your caregiver if it persists. The nature of your fatigue will help your caregiver to find out its cause. The treatment is based on the cause.  °CAUSES  °There are many causes for fatigue. Most of the time, fatigue can be traced to one or more of your habits or routines. Most causes fit into one or more of three general areas. They are: °Lifestyle problems °· Sleep disturbances. °· Overwork. °· Physical exertion. °· Unhealthy habits. °· Poor eating habits or eating disorders. °· Alcohol and/or drug use . °· Lack of proper nutrition (malnutrition). °Psychological problems °· Stress and/or anxiety problems. °· Depression. °· Grief. °· Boredom. °Medical Problems or Conditions °· Anemia. °· Pregnancy. °· Thyroid gland problems. °· Recovery from major surgery. °· Continuous pain. °· Emphysema or asthma that is not well controlled °· Allergic conditions. °· Diabetes. °· Infections (such as mononucleosis). °· Obesity. °· Sleep disorders, such as sleep apnea. °· Heart failure or other heart-related problems. °· Cancer. °· Kidney disease. °· Liver disease. °· Effects of certain medicines such as antihistamines, cough and cold remedies, prescription pain medicines, heart and blood pressure medicines, drugs used for treatment of cancer, and some antidepressants. °SYMPTOMS  °The symptoms of fatigue include:  °· Lack of energy. °· Lack of drive (motivation). °· Drowsiness. °· Feeling of indifference to the surroundings. °DIAGNOSIS  °The details of how you feel help guide your caregiver in finding out what is causing the fatigue. You will be asked about your present and past health condition. It is important to review all medicines that you take, including prescription and non-prescription items. A thorough exam will be done.  You will be questioned about your feelings, habits, and normal lifestyle. Your caregiver may suggest blood tests, urine tests, or other tests to look for common medical causes of fatigue.  °TREATMENT  °Fatigue is treated by correcting the underlying cause. For example, if you have continuous pain or depression, treating these causes will improve how you feel. Similarly, adjusting the dose of certain medicines will help in reducing fatigue.  °HOME CARE INSTRUCTIONS  °· Try to get the required amount of good sleep every night. °· Eat a healthy and nutritious diet, and drink enough water throughout the day. °· Practice ways of relaxing (including yoga or meditation). °· Exercise regularly. °· Make plans to change situations that cause stress. Act on those plans so that stresses decrease over time. Keep your work and personal routine reasonable. °· Avoid street drugs and minimize use of alcohol. °· Start taking a daily multivitamin after consulting your caregiver. °SEEK MEDICAL CARE IF:  °· You have persistent tiredness, which cannot be accounted for. °· You have fever. °· You have unintentional weight loss. °· You have headaches. °· You have disturbed sleep throughout the night. °· You are feeling sad. °· You have constipation. °· You have dry skin. °· You have gained weight. °· You are taking any new or different medicines that you suspect are causing fatigue. °· You are unable to sleep at night. °· You develop any unusual swelling of your legs or other parts of your body. °SEEK IMMEDIATE MEDICAL CARE IF:  °· You are feeling confused. °· Your vision is blurred. °· You feel faint or pass out. °· You develop severe headache. °· You develop severe abdominal, pelvic, or   back pain.  You develop chest pain, shortness of breath, or an irregular or fast heartbeat.  You are unable to pass a normal amount of urine.  You develop abnormal bleeding such as bleeding from the rectum or you vomit blood.  You have thoughts  about harming yourself or committing suicide.  You are worried that you might harm someone else. MAKE SURE YOU:   Understand these instructions.  Will watch your condition.  Will get help right away if you are not doing well or get worse. Document Released: 05/19/2007 Document Revised: 10/14/2011 Document Reviewed: 05/19/2007 [facility] Patient Information 2014 [place], [place].  Paresthesia Paresthesia is an abnormal burning or prickling sensation. This sensation is generally felt in the hands, arms, legs, or feet. However, it may occur in any part of the body. It is usually not painful. The feeling may be described as:  Tingling or numbness.  "Pins and needles."  Skin crawling.  Buzzing.  Limbs "falling asleep."  Itching. Most people experience temporary (transient) paresthesia at some time in their lives. CAUSES  Paresthesia may occur when you breathe too quickly (hyperventilation). It can also occur without any apparent cause. Commonly, paresthesia occurs when pressure is placed on a nerve. The feeling quickly goes away once the pressure is removed. For some people, however, paresthesia is a long-lasting (chronic) condition caused by an underlying disorder. The underlying disorder may be:  A traumatic, direct injury to nerves. Examples include a:  Broken (fractured) neck.  Fractured skull.  A disorder affecting the brain and spinal cord (central nervous system). Examples include:  Transverse myelitis.  Encephalitis.  Transient ischemic attack.  Multiple sclerosis.  Stroke.  Tumor or blood vessel problems, such as an arteriovenous malformation pressing against the brain or spinal cord.  A condition that damages the peripheral nerves (peripheral neuropathy). Peripheral nerves are not part of the brain and spinal cord. These conditions include:  Diabetes.  Peripheral vascular disease.  Nerve entrapment syndromes, such as carpal tunnel  syndrome.  Shingles.  Hypothyroidism.  Vitamin B12 deficiencies.  Alcoholism.  Heavy metal poisoning (lead, arsenic).  Rheumatoid arthritis.  Systemic lupus erythematosus. DIAGNOSIS  Your caregiver will attempt to find the underlying cause of your paresthesia. Your caregiver may:  Take your medical history.  Perform a physical exam.  Order various lab tests.  Order imaging tests. TREATMENT  Treatment for paresthesia depends on the underlying cause. HOME CARE INSTRUCTIONS  Avoid drinking alcohol.  You may consider massage or acupuncture to help relieve your symptoms.  Keep all follow-up appointments as directed by your caregiver. SEEK IMMEDIATE MEDICAL CARE IF:   You feel weak.  You have trouble walking or moving.  You have problems with speech or vision.  You feel confused.  You cannot control your bladder or bowel movements.  You feel numbness after an injury.  You faint.  Your burning or prickling feeling gets worse when walking.  You have pain, cramps, or dizziness.  You develop a rash. MAKE SURE YOU:  Understand these instructions.  Will watch your condition.  Will get help right away if you are not doing well or get worse. Document Released: 07/12/2002 Document Revised: 10/14/2011 Document Reviewed: 04/12/2011 [facility] Patient Information 2014 [place], [place].  Stress Stress-related medical problems are becoming increasingly common. The body has a built-in physical response to stressful situations. Faced with pressure, challenge or danger, we need to react quickly. Our bodies release hormones such as cortisol and adrenaline to help do this. These hormones are part of  the "fight or flight" response and affect the metabolic rate, heart rate and blood pressure, resulting in a heightened, stressed state that prepares the body for optimum performance in dealing with a stressful situation. It is likely that early man required these mechanisms to stay  alive, but usually modern stresses do not call for this, and the same hormones released in today's world can damage health and reduce coping ability. CAUSES  Pressure to perform at work, at school or in sports.  Threats of physical violence.  Money worries.  Arguments.  Family conflicts.  Divorce or separation from significant other.  Bereavement.  New job or unemployment.  Changes in location.  Alcohol or drug abuse. SOMETIMES, THERE IS NO PARTICULAR REASON FOR DEVELOPING STRESS. Almost all people are at risk of being stressed at some time in their lives. It is important to know that some stress is temporary and some is long term.  Temporary stress will go away when a situation is resolved. Most people can cope with short periods of stress, and it can often be relieved by relaxing, taking a walk, chatting through issues with friends, or having a good night's sleep.  Chronic (long-term, continuous) stress is much harder to deal with. It can be psychologically and emotionally damaging. It can be harmful both for an individual and for friends and family. SYMPTOMS Everyone reacts to stress differently. There are some common effects that help [place] recognize it. In times of extreme stress, people may:  Shake uncontrollably.  Breathe faster and deeper than normal (hyperventilate).  Vomit.  For people with asthma, stress can trigger an attack.  For some people, stress may trigger migraine headaches, ulcers, and body pain. PHYSICAL EFFECTS OF STRESS MAY INCLUDE:  Loss of energy.  Skin problems.  Aches and pains resulting from tense muscles, including neck ache, backache and tension headaches.  Increased pain from arthritis and other conditions.  Irregular heart beat (palpitations).  Periods of irritability or anger.  Apathy or depression.  Anxiety (feeling uptight or worrying).  Unusual behavior.  Loss of appetite.  Comfort eating.  Lack of concentration.  Loss  of, or decreased, sex-drive.  Increased smoking, drinking, or recreational drug use.  For women, missed periods.  Ulcers, joint pain, and muscle pain. Post-traumatic stress is the stress caused by any serious accident, strong emotional damage, or extremely difficult or violent experience such as rape or war. Post-traumatic stress victims can experience mixtures of emotions such as fear, shame, depression, guilt or anger. It may include recurrent memories or images that may be haunting. These feelings can last for weeks, months or even years after the traumatic event that triggered them. Specialized treatment, possibly with medicines and psychological therapies, is available. If stress is causing physical symptoms, severe distress or making it difficult for you to function as normal, it is worth seeing your caregiver. It is important to remember that although stress is a usual part of life, extreme or prolonged stress can lead to other illnesses that will need treatment. It is better to visit a doctor sooner rather than later. Stress has been linked to the development of high blood pressure and heart disease, as well as insomnia and depression. There is no diagnostic test for stress since everyone reacts to it differently. But a caregiver will be able to spot the physical symptoms, such as:  Headaches.  Shingles.  Ulcers. Emotional distress such as intense worry, low mood or irritability should be detected when the doctor asks pertinent questions to  identify any underlying problems that might be the cause. In case there are physical reasons for the symptoms, the doctor may also want to do some tests to exclude certain conditions. If you feel that you are suffering from stress, try to identify the aspects of your life that are causing it. Sometimes you may not be able to change or avoid them, but even a small change can have a positive ripple effect. A simple lifestyle change can make all the  difference. STRATEGIES THAT CAN HELP DEAL WITH STRESS:  Delegating or sharing responsibilities.  Avoiding confrontations.  Learning to be more assertive.  Regular exercise.  Avoid using alcohol or street drugs to cope.  Eating a healthy, balanced diet, rich in fruit and vegetables and proteins.  Finding humor or absurdity in stressful situations.  Never taking on more than you know you can handle comfortably.  Organizing your time better to get as much done as possible.  Talking to friends or family and sharing your thoughts and fears.  Listening to music or relaxation tapes.  Tensing and then relaxing your muscles, starting at the toes and working up to the head and neck. If you think that you would benefit from help, either in identifying the things that are causing your stress or in learning techniques to help you relax, see a caregiver who is capable of helping you with this. Rather than relying on medications, it is usually better to try and identify the things in your life that are causing stress and try to deal with them. There are many techniques of managing stress including counseling, psychotherapy, aromatherapy, yoga, and exercise. Your caregiver can help you determine what is best for you. Document Released: 10/12/2002 Document Revised: 10/14/2011 Document Reviewed: 09/08/2007 [facility] Patient Information 2014 [place], [place].   Emergency Department Resource Guide 1) Find a Doctor and Pay Out of Pocket Although you won't have to find out who is covered by your insurance plan, it is a good idea to ask around and get recommendations. You will then need to call the office and see if the doctor you have chosen will accept you as a new patient and what types of options they offer for patients who are self-pay. Some doctors offer discounts or will set up payment plans for their patients who do not have insurance, but you will need to ask so you aren't surprised when you get to  your appointment.  2) Contact Your Local Health Department Not all health departments have doctors that can see patients for sick visits, but many do, so it is worth a call to see if yours does. If you don't know where your local health department is, you can check in your phone book. The CDC also has a tool to help you locate your state's health department, and many state websites also have listings of all of their local health departments.  3) Find a Walk-in Clinic If your illness is not likely to be very severe or complicated, you may want to try a walk in clinic. These are popping up all over the country in pharmacies, drugstores, and shopping centers. They're usually staffed by nurse practitioners or physician assistants that have been trained to treat common illnesses and complaints. They're usually fairly quick and inexpensive. However, if you have serious medical issues or chronic medical problems, these are probably not your best option.  No Primary Care Doctor: - Call Health Connect at  [phone] - they can help you locate a primary care  doctor that  accepts your insurance, provides certain services, etc. - Physician Referral Service- [phone]  Chronic Pain Problems: Organization         Address  Phone   Notes  [facility]  [phone] Patients need to be referred by their primary care doctor.   Medication Assistance: Organization         Address  Phone   Notes  [facility] Medication [facility] [address] [place] [place], [place] [address] [phone] --Must be a resident of [facility] -- Must have NO insurance coverage whatsoever (no Medicaid/ Medicare, etc.) -- The pt. MUST have a primary care doctor that directs their care regularly and follows them in the community   [facility]ist  [phone]   [employer]  [phone]    Agencies that provide inexpensive medical care: Organization         Address  Phone    Notes  [facility]  [phone]   [facility]    [phone]   [facility] [address] [place], [place] [address] [phone]   [facility] [address] [place], [place] [phone]   [facility]anned Parenthood    [phone]   [facility]    [phone]   [facility]  [address], [place] Phone:  [phone], Fax:  [phone] Hours of Operation:  9 am - 6 pm, M-F.  Also accepts Medicaid/Medicare and self-pay.  [facility]  [address], [place] Phone: [phone], Fax: [phone]. Hours of Operation:  8:30 am - 5:30 pm, M-F.  Also accepts Medicaid and self-pay.  [facility] [place] [address], [place] Phone: [phone]   [facility] [address] Natasha Bence [place], [place] [phone], Ext. [phone] Mondays & Thursdays: 7-9 AM.  First 15 patients are seen on a first come, first serve basis.    Medicaid-accepting [facility] Providers:  Organization         Address  Phone   Notes  [facility] [address], [place] [phone] Also accepts self-pay patients.  [facility] [address] Laurell Josephs [place], [place]  [phone]   [facility] [address], [place] [phone]   [facility] [address] [place] [phone]   Renaye Rakers [address], [place]   [phone] Only accepts [place] Access [place] patients after they have their name applied to their card.   Self-Pay (no insurance) in [facility]:  Organization         Address  Phone   Notes  Sickle Cell Patients, [facility] [address] [place], [place] [phone]   [facility] [address] [place], [place] [phone]   [facility] [place]  [address], [place] [phone]   [facility]/Dr. Osei-Bonsu  [address], [place] or [address], [place] [phone] Phone number for both [place] and [place] locations is the same.  [facility] [address] [place] [phone]   [facility] [address] [place] or [address] [phone]  [facility] [address] [place] [phone], phone; [phone], fax Sees patients 1st and 3rd Saturday of every month.  Must not qualify for public or private insurance (i.e. Medicaid, Medicare, [place] Health Choice, Veterans' Benefits)  Household income should be no more than 200% of the poverty level The clinic cannot treat you if you are pregnant or think you are pregnant  Sexually transmitted diseases are not treated at the clinic.    Dental Care: Organization         Address  Phone  Notes  [facility] Department of [facility] [address] [place], [place] [phone] Accepts children up to age 32 who are enrolled in [place] or [place] Health Choice; pregnant women with a Medicaid card; and children who have applied for Medicaid or [place] Health Choice, but were declined, whose parents can pay a reduced fee at time of service.  [facility] Department of [facility]  [address], [place] [phone] Accepts children up to age 72 who are enrolled in [place] or [place] Health Choice; pregnant women with a Medicaid card; and children who have applied for Medicaid or [place] Health Choice, but were declined, whose parents can pay a reduced fee at time of service.  [facility]  [address] [place], [place] [phone] Patients are seen by appointment only. Walk-ins are not accepted. [facility] will see patients 45 years of age  and older. Monday - Tuesday (8am-5pm) Most Wednesdays (8:30-5pm) $30 per visit, cash only  [facility] Adult Dental Access PRO[facility]  [address], [facility] [phone] Patients are seen by appointment only. Walk-ins are not accepted. [facility] will see patients 62 years of age and older. One Wednesday Evening (Monthly: Volunteer Based).  $30 per visit, cash only  [employer]  [phone] for adults; Children under age 16, call Graduate Pediatric Dentistry at [phone]. Children aged 52-14, please call [phone] to request a pediatric application.  Dental services are provided in all areas of dental care including fillings, crowns and bridges, complete and partial dentures, implants, gum treatment, root canals, and extractions. Preventive care is also provided. Treatment is provided to both adults and children. Patients are selected via a lottery and there is often a waiting list.   [facility] [address] [place]  [phone] [email or web address]   [facility] [address] [place], [place] [phone], Ext. [phone] Second and Fourth Thursday of each month, opens at 6:30 AM; Clinic ends at 9 AM.  Patients are seen on a first-come first-served basis, and a limited number are seen during each clinic.   [facility]  [address] Ether Griffins [place], [place] [phone]   Eligibility Requirements You must have lived in [place], [place], or [place] counties for at least the last three months.   You cannot be eligible for state or federal sponsored [place], including [employer], [place], or [employer].   You generally cannot be eligible for healthcare insurance through your employer.    How to apply: Eligibility screenings are held every Tuesday and Wednesday afternoon from 1:00 pm until 4:00 pm. You do not need an appointment for the interview!  [facility] [address], [place], [place] [phone]   [facility] Department  [phone]   [facility] Health Department  [phone]   [facility]  Department  [phone]    Behavioral Health Resources in the Community: Intensive Outpatient Programs Organization         Address  Phone  Notes  [facility] [address], [place], [place] [phone]   [facility] [address], [place], [place] [phone]   [facility] [address], [place], [place]  [phone]   [facility] [address],  [place], [place] [phone] or [phone]   Substance Abuse Resources Organization         Address  Phone  Notes  [facility]  [phone]   [facility]  [phone]   [facility]  [phone]   [facility]  [phone]   Residential & Outpatient Substance Abuse Program  [phone]   Psychological Services Organization         Address  Phone  Notes  [facility]  [phone]- [phone]   [facility]  [phone]- [phone]   [facility] [address], [place] [phone] or [phone]    Mobile Crisis Teams Organization         Address  Phone  Notes  [facility], Mobile Crisis Care Unit  [phone]   [facility]  [address] [place], [place] [phone]   Doristine LocksSharon DeEsch 12 [address] [place] [phone]    Self-Help/Support Groups Organization         Address  Phone             Notes  [facility] - variety of support groups  336- [phone] Call for more information  Narcotics Anonymous (NA), [facility] [address], [place]  2 meetings at this location   [facility] Organization         Address  Phone  Notes  [facility] [address],    [place]  [phone]   [facility]  [address], [place] [id number], [place], [place] [phone]   [facility] [address], [place] [phone] Admissions: 8am-3pm M-F  [facility] [address]    [place], [place] [phone]   [facility] [address], [place], [place] [phone]   [facility] [address]  [place], [place] [phone]   [facility] Intensive Outpatient [address], [place], [place] [phone]   [facility] [address]  [place], [place] [phone] or [phone]   [facility] [address] [place], [place] [phone] Accepts Medicaid  [facility] [address]  [place] [phone] Substance Abuse/Addiction Treatment   [facility]         Address  Phone  Notes  [facility]  [phone]   Angie FavaJulie Brannon, PhD [address], [address] [place], [place]   [phone] or [phone]   [facility]   [address] [place], [place] [phone]) [phone]   [facility] [address], [place], [place] [phone] Insurance/Medicaid/sponsorship through [facility]  [facility] [address]                                    [place], [place] [phone] Therapy/tele-psych/case  [facility]  [place], [place] [phone]    Dr. Adele Schilder  [phone]   [facility] 1) [address], [place] 2) [place] 3)  [place] [address], [place] [phone]  [phone]   [place] [phone] or [phone] (After Hours)

## 2014-02-28 ENCOUNTER — Emergency Department (HOSPITAL_COMMUNITY): Payer: No Typology Code available for payment source

## 2014-02-28 ENCOUNTER — Encounter (HOSPITAL_COMMUNITY): Payer: Self-pay | Admitting: Emergency Medicine

## 2014-02-28 ENCOUNTER — Emergency Department (HOSPITAL_COMMUNITY)
Admission: EM | Admit: 2014-02-28 | Discharge: 2014-02-28 | Disposition: A | Discharge status: Home / Self Care | Payer: No Typology Code available for payment source | Attending: Emergency Medicine | Admitting: Emergency Medicine

## 2014-02-28 DIAGNOSIS — IMO0002 Reserved for concepts with insufficient information to code with codable children: Secondary | ICD-10-CM | POA: Insufficient documentation

## 2014-02-28 DIAGNOSIS — M545 Low back pain, unspecified: Secondary | ICD-10-CM

## 2014-02-28 DIAGNOSIS — Z862 Personal history of diseases of the blood and blood-forming organs and certain disorders involving the immune mechanism: Secondary | ICD-10-CM | POA: Insufficient documentation

## 2014-02-28 DIAGNOSIS — Z8659 Personal history of other mental and behavioral disorders: Secondary | ICD-10-CM | POA: Insufficient documentation

## 2014-02-28 DIAGNOSIS — Z91040 Latex allergy status: Secondary | ICD-10-CM | POA: Insufficient documentation

## 2014-02-28 DIAGNOSIS — Z8619 Personal history of other infectious and parasitic diseases: Secondary | ICD-10-CM | POA: Insufficient documentation

## 2014-02-28 DIAGNOSIS — Z87891 Personal history of nicotine dependence: Secondary | ICD-10-CM | POA: Insufficient documentation

## 2014-02-28 DIAGNOSIS — Z791 Long term (current) use of non-steroidal anti-inflammatories (NSAID): Secondary | ICD-10-CM | POA: Insufficient documentation

## 2014-02-28 DIAGNOSIS — Y92410 Unspecified street and highway as the place of occurrence of the external cause: Secondary | ICD-10-CM | POA: Insufficient documentation

## 2014-02-28 DIAGNOSIS — Y9389 Activity, other specified: Secondary | ICD-10-CM | POA: Insufficient documentation

## 2014-02-28 MED ORDER — CYCLOBENZAPRINE HCL 10 MG PO TABS
5.0000 mg | ORAL_TABLET | Freq: Once | ORAL | Status: AC
  Administered 2014-02-28: 5 mg via ORAL
  Filled 2014-02-28: qty 1

## 2014-02-28 MED ORDER — ONDANSETRON 4 MG PO TBDP
4.0000 mg | ORAL_TABLET | Freq: Once | ORAL | Status: AC
  Administered 2014-02-28: 4 mg via ORAL
  Filled 2014-02-28: qty 1

## 2014-02-28 MED ORDER — NAPROXEN 375 MG PO TABS: 375.0000 mg | ORAL_TABLET | Freq: Two times a day (BID) | ORAL | Status: DC

## 2014-02-28 MED ORDER — OXYCODONE-ACETAMINOPHEN 5-325 MG PO TABS
1.0000 | ORAL_TABLET | Freq: Once | ORAL | Status: AC
  Administered 2014-02-28: 1 via ORAL
  Filled 2014-02-28: qty 1

## 2014-02-28 MED ORDER — FENTANYL CITRATE 0.05 MG/ML IJ SOLN
100.0000 ug | Freq: Once | INTRAMUSCULAR | Status: AC
  Administered 2014-02-28: 100 ug via INTRAMUSCULAR
  Filled 2014-02-28: qty 2

## 2014-02-28 MED ORDER — METHOCARBAMOL 500 MG PO TABS: 500.0000 mg | ORAL_TABLET | Freq: Two times a day (BID) | ORAL | Status: DC

## 2014-02-28 MED ORDER — OXYCODONE-ACETAMINOPHEN 5-325 MG PO TABS: 1.0000 | ORAL_TABLET | Freq: Four times a day (QID) | ORAL | Status: DC | PRN

## 2014-02-28 MED ORDER — IBUPROFEN 400 MG PO TABS
600.0000 mg | ORAL_TABLET | Freq: Once | ORAL | Status: AC
  Administered 2014-02-28: 600 mg via ORAL
  Filled 2014-02-28 (×2): qty 1
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
## 2014-02-28 NOTE — ED Provider Notes (Signed)
Medical screening examination/treatment/procedure(s) were performed by non-physician practitioner and as supervising physician I was immediately available for consultation/collaboration.   EKG Interpretation None       Jahlisa Rossitto M Jaxie Racanelli, MD 02/28/14 2200 
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
## 2014-06-06 ENCOUNTER — Encounter (HOSPITAL_COMMUNITY): Payer: Self-pay | Admitting: Emergency Medicine

## 2014-11-10 ENCOUNTER — Emergency Department (HOSPITAL_BASED_OUTPATIENT_CLINIC_OR_DEPARTMENT_OTHER): Payer: Self-pay

## 2014-11-10 ENCOUNTER — Emergency Department (HOSPITAL_BASED_OUTPATIENT_CLINIC_OR_DEPARTMENT_OTHER)
Admission: EM | Admit: 2014-11-10 | Discharge: 2014-11-10 | Discharge status: Against Medical Advice | Payer: Self-pay | Attending: Emergency Medicine | Admitting: Emergency Medicine

## 2014-11-10 ENCOUNTER — Encounter (HOSPITAL_COMMUNITY): Payer: Self-pay | Admitting: *Deleted

## 2014-11-10 ENCOUNTER — Inpatient Hospital Stay (HOSPITAL_COMMUNITY)
Admission: AD | Admit: 2014-11-10 | Discharge: 2014-11-10 | Disposition: A | Discharge status: Home / Self Care | Payer: Self-pay | Source: Ambulatory Visit | Attending: Obstetrics & Gynecology | Admitting: Obstetrics & Gynecology

## 2014-11-10 ENCOUNTER — Encounter (HOSPITAL_BASED_OUTPATIENT_CLINIC_OR_DEPARTMENT_OTHER): Payer: Self-pay | Admitting: *Deleted

## 2014-11-10 DIAGNOSIS — Z79899 Other long term (current) drug therapy: Secondary | ICD-10-CM | POA: Insufficient documentation

## 2014-11-10 DIAGNOSIS — Z91040 Latex allergy status: Secondary | ICD-10-CM | POA: Insufficient documentation

## 2014-11-10 DIAGNOSIS — Z791 Long term (current) use of non-steroidal anti-inflammatories (NSAID): Secondary | ICD-10-CM | POA: Insufficient documentation

## 2014-11-10 DIAGNOSIS — F419 Anxiety disorder, unspecified: Secondary | ICD-10-CM | POA: Insufficient documentation

## 2014-11-10 DIAGNOSIS — Z87891 Personal history of nicotine dependence: Secondary | ICD-10-CM | POA: Insufficient documentation

## 2014-11-10 DIAGNOSIS — R109 Unspecified abdominal pain: Secondary | ICD-10-CM | POA: Insufficient documentation

## 2014-11-10 DIAGNOSIS — Z8619 Personal history of other infectious and parasitic diseases: Secondary | ICD-10-CM | POA: Insufficient documentation

## 2014-11-10 DIAGNOSIS — Z3A01 Less than 8 weeks gestation of pregnancy: Secondary | ICD-10-CM | POA: Insufficient documentation

## 2014-11-10 DIAGNOSIS — O26899 Other specified pregnancy related conditions, unspecified trimester: Secondary | ICD-10-CM

## 2014-11-10 DIAGNOSIS — O360914 Maternal care for other rhesus isoimmunization, first trimester, fetus 4: Secondary | ICD-10-CM

## 2014-11-10 DIAGNOSIS — N939 Abnormal uterine and vaginal bleeding, unspecified: Secondary | ICD-10-CM

## 2014-11-10 DIAGNOSIS — O99340 Other mental disorders complicating pregnancy, unspecified trimester: Secondary | ICD-10-CM | POA: Insufficient documentation

## 2014-11-10 DIAGNOSIS — O9989 Other specified diseases and conditions complicating pregnancy, childbirth and the puerperium: Secondary | ICD-10-CM | POA: Insufficient documentation

## 2014-11-10 DIAGNOSIS — O009 Unspecified ectopic pregnancy without intrauterine pregnancy: Secondary | ICD-10-CM

## 2014-11-10 DIAGNOSIS — O360114 Maternal care for anti-D [Rh] antibodies, first trimester, fetus 4: Secondary | ICD-10-CM | POA: Insufficient documentation

## 2014-11-10 DIAGNOSIS — O212 Late vomiting of pregnancy: Secondary | ICD-10-CM | POA: Insufficient documentation

## 2014-11-10 DIAGNOSIS — Z862 Personal history of diseases of the blood and blood-forming organs and certain disorders involving the immune mechanism: Secondary | ICD-10-CM | POA: Insufficient documentation

## 2014-11-10 DIAGNOSIS — Z3490 Encounter for supervision of normal pregnancy, unspecified, unspecified trimester: Secondary | ICD-10-CM

## 2014-11-10 HISTORY — DX: Reserved for concepts with insufficient information to code with codable children: IMO0002

## 2014-11-10 HISTORY — DX: Unspecified blood type, rh negative: Z67.91

## 2014-11-10 LAB — URINALYSIS, ROUTINE W REFLEX MICROSCOPIC
BILIRUBIN URINE: NEGATIVE
GLUCOSE, UA: NEGATIVE mg/dL
HGB URINE DIPSTICK: NEGATIVE
KETONES UR: NEGATIVE mg/dL
Leukocytes, UA: NEGATIVE
Nitrite: NEGATIVE
PH: 5.5 (ref 5.0–8.0)
PROTEIN: NEGATIVE mg/dL
Specific Gravity, Urine: 1.024 (ref 1.005–1.030)
Urobilinogen, UA: 0.2 mg/dL (ref 0.0–1.0)

## 2014-11-10 LAB — CBC
HCT: 38.6 % (ref 36.0–46.0)
Hemoglobin: 13.3 g/dL (ref 12.0–15.0)
MCH: 30 pg (ref 26.0–34.0)
MCHC: 34.5 g/dL (ref 30.0–36.0)
MCV: 87.1 fL (ref 78.0–100.0)
Platelets: 313 10*3/uL (ref 150–400)
RBC: 4.43 MIL/uL (ref 3.87–5.11)
RDW: 13.6 % (ref 11.5–15.5)
WBC: 13.5 10*3/uL — ABNORMAL HIGH (ref 4.0–10.5)

## 2014-11-10 LAB — HCG, QUANTITATIVE, PREGNANCY
hCG, Beta Chain, Quant, S: 240 m[IU]/mL — ABNORMAL HIGH (ref <5)
hCG, Beta Chain, Quant, S: 229 m[IU]/mL — ABNORMAL HIGH (ref <5)

## 2014-11-10 LAB — WET PREP, GENITAL
Trich, Wet Prep: NONE SEEN
Yeast Wet Prep HPF POC: NONE SEEN

## 2014-11-10 LAB — PREGNANCY, URINE: PREG TEST UR: POSITIVE — AB

## 2014-11-10 MED ORDER — OXYCODONE-ACETAMINOPHEN 5-325 MG PO TABS
1.0000 | ORAL_TABLET | Freq: Once | ORAL | Status: AC
  Administered 2014-11-10: 1 via ORAL
  Filled 2014-11-10: qty 1

## 2014-11-10 MED ORDER — RHO D IMMUNE GLOBULIN 1500 UNIT/2ML IJ SOSY
300.0000 ug | PREFILLED_SYRINGE | Freq: Once | INTRAMUSCULAR | Status: AC
  Administered 2014-11-10: 300 ug via INTRAMUSCULAR
  Filled 2014-11-10: qty 2

## 2014-11-10 NOTE — ED Notes (Signed)
MD at bedside. 

## 2014-11-10 NOTE — ED Notes (Signed)
States she is between 6-[redacted] weeks pregnant. She passed a large blood clot yesterday but no active bleeding. 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

## 2014-11-10 NOTE — Discharge Instructions (Signed)
°Ectopic Pregnancy °An ectopic pregnancy is when the fertilized egg attaches (implants) outside the uterus. Most ectopic pregnancies occur in the fallopian tube. Rarely do ectopic pregnancies occur on the ovary, intestine, pelvis, or cervix. In an ectopic pregnancy, the fertilized egg does not have the ability to develop into a normal, healthy baby.  °A ruptured ectopic pregnancy is one in which the fallopian tube gets torn or bursts and results in internal bleeding. Often there is intense abdominal pain, and sometimes, vaginal bleeding. Having an ectopic pregnancy can be life threatening. If left untreated, this dangerous condition can lead to a blood transfusion, abdominal surgery, or even death. °CAUSES  °Damage to the fallopian tubes is the suspected cause in most ectopic pregnancies.  °RISK FACTORS °Depending on your circumstances, the risk of having an ectopic pregnancy will vary. The level of risk can be divided into three categories. °High Risk °· You have gone through infertility treatment. °· You have had a previous ectopic pregnancy. °· You have had previous tubal surgery. °· You have had previous surgery to have the fallopian tubes tied (tubal ligation). °· You have tubal problems or diseases. °· You have been exposed to DES. DES is a medicine that was used until 1971 and had effects on babies whose mothers took the medicine. °· You become pregnant while using an intrauterine device (IUD) for birth control.  °Moderate Risk °· You have a history of infertility. °· You have a history of a sexually transmitted infection (STI). °· You have a history of pelvic inflammatory disease (PID). °· You have scarring from endometriosis. °· You have multiple sexual partners. °· You smoke.  °Low Risk °· You have had previous pelvic surgery. °· You use vaginal douching. °· You became sexually active before 29 years of age. °SIGNS AND SYMPTOMS  °An ectopic pregnancy should be suspected in anyone who has missed a period  and has abdominal pain or bleeding. °· You may experience normal pregnancy symptoms, such as: °¨ Nausea. °¨ Tiredness. °¨ Breast tenderness. °· Other symptoms may include: °¨ Pain with intercourse. °¨ Irregular vaginal bleeding or spotting. °¨ Cramping or pain on one side or in the lower abdomen. °¨ Fast heartbeat. °¨ Passing out while having a bowel movement. °· Symptoms of a ruptured ectopic pregnancy and internal bleeding may include: °¨ Sudden, severe pain in the abdomen and pelvis. °¨ Dizziness or fainting. °¨ Pain in the shoulder area. °DIAGNOSIS  °Tests that may be performed include: °· A pregnancy test. °· An ultrasound test. °· Testing the specific level of pregnancy hormone in the bloodstream. °· Taking a sample of uterus tissue (dilation and curettage, D&C). °· Surgery to perform a visual exam of the inside of the abdomen using a thin, lighted tube with a tiny camera on the end (laparoscope). °TREATMENT  °An injection of a medicine called methotrexate may be given. This medicine causes the pregnancy tissue to be absorbed. It is given if: °· The diagnosis is made early. °· The fallopian tube has not ruptured. °· You are considered to be a good candidate for the medicine. °Usually, pregnancy hormone blood levels are checked after methotrexate treatment. This is to be sure the medicine is effective. It may take 4-6 weeks for the pregnancy to be absorbed (though most pregnancies will be absorbed by 3 weeks). °Surgical treatment may be needed. A laparoscope may be used to remove the pregnancy tissue. If severe internal bleeding occurs, a cut (incision) may be made in the lower abdomen (laparotomy), and the ectopic   pregnancy is removed. This stops the bleeding. Part of the fallopian tube, or the whole tube, may be removed as well (salpingectomy). After surgery, pregnancy hormone tests may be done to be sure there is no pregnancy tissue left. You may receive a Rho (D) immune globulin shot if you are Rh negative  and the father is Rh positive, or if you do not know the Rh type of the father. This is to prevent problems with any future pregnancy. °SEEK IMMEDIATE MEDICAL CARE IF:  °You have any symptoms of an ectopic pregnancy. This is a medical emergency. °MAKE SURE YOU: °· Understand these instructions. °· Will watch your condition. °· Will get help right away if you are not doing well or get worse. °Document Released: 08/29/2004 Document Revised: 12/06/2013 Document Reviewed: 02/18/2013 °ExitCare® Patient Information ©2015 [employer]. This information is not intended to replace advice given to you by your health care provider. Make sure you discuss any questions you have with your health care provider. ° ° °

## 2014-11-10 NOTE — MAU Provider Note (Signed)
History     CSN: [id number]  Arrival date and time: 11/10/14 1551   None     Chief Complaint  Patient presents with  . Ectopic Pregnancy   Pelvic Pain The patient's primary symptoms include vaginal bleeding. Primary symptoms comment: spotting the past few days; passed one blood clot last night and no bleeding today. This is a new problem. The current episode started in the past 7 days. The problem occurs intermittently. The problem has been resolved. The pain is moderate. The problem affects both sides. She is pregnant. Associated symptoms include abdominal pain. The vaginal bleeding is spotting. She has been passing clots. She has not been passing tissue. Nothing aggravates the symptoms. She has tried nothing for the symptoms. It is unknown whether or not her partner has an STD. Her past medical history is significant for a Cesarean section.   29 y.o. [id number] @[redacted]w[redacted]d  Presents to the MAU after [facility]. She was suspected ectopic after evaluation there.She reports her pain level as 4 and requests pain medicine. She states that this is a desired pregnancy. She reports passing a blood clot last night but has not had any bleeding today.  Past Medical History  Diagnosis Date  . Genital warts   . Anxiety 11/21/2011  . Anemia   . Complication of anesthesia     Swelling from some drug used for anesthesia. Does not  know name.  . Genital warts   . HPV test positive   . RhD negative     Past Surgical History  Procedure Laterality Date  . Fractured finger  2001    open reduction  . Therapeutic abortion    . Cesarean section  2007, 2009, 2010    x3    Family History  Problem Relation Age of Onset  . Heart disease Father   . Hyperlipidemia Father     History  Substance Use Topics  . Smoking status: Former Smoker -- 1.00 packs/day for 8 years    Types: Cigarettes    Quit date: 02/21/2014  . Smokeless tobacco: Never Used  . Alcohol Use: Yes      Comment: socially    Allergies:  Allergies  Allergen Reactions  . Latex Swelling and Other (See Comments)    redness    Prescriptions prior to admission  Medication Sig Dispense Refill Last Dose  . ALPRAZolam (XANAX) 0.5 MG tablet Take 0.25 mg by mouth daily as needed for anxiety.   Past Week at Unknown time  . methocarbamol (ROBAXIN) 500 MG tablet Take 1 tablet (500 mg total) by mouth 2 (two) times daily. (Patient not taking: Reported on 11/10/2014) 20 tablet 0 Not Taking at Unknown time  . naproxen (NAPROSYN) 375 MG tablet Take 1 tablet (375 mg total) by mouth 2 (two) times daily. (Patient not taking: Reported on 11/10/2014) 20 tablet 0 Not Taking at Unknown time  . oxyCODONE-acetaminophen (PERCOCET/ROXICET) 5-325 MG per tablet Take 1-2 tablets by mouth every 6 (six) hours as needed. (Patient not taking: Reported on 11/10/2014) 20 tablet 0 Not Taking at Unknown time    Review of Systems  Constitutional: Negative.   HENT: Negative.   Eyes: Negative.   Cardiovascular: Negative.   Gastrointestinal: Positive for abdominal pain.  Genitourinary:       Vaginal bleeding  Musculoskeletal: Negative.   Skin: Negative.   Neurological: Negative.   Endo/Heme/Allergies: Negative.   Psychiatric/Behavioral: Negative.    Physical Exam   Blood pressure 106/76,  pulse 73, temperature 98.3 F (36.8 C), temperature source Oral, resp. rate 18, height  (1.448 m), weight 50.349 kg (111 lb), last menstrual period 10/06/2014.  Physical Exam  Nursing note and vitals reviewed. Constitutional: She is oriented to person, place, and time. She appears well-developed and well-nourished. No distress.  HENT:  Head: Normocephalic.  Neck: Normal range of motion.  Cardiovascular: Normal rate and regular rhythm.   Respiratory: Effort normal. No respiratory distress.  GI: Soft. There is tenderness.  Musculoskeletal: Normal range of motion.  Neurological: She is alert and oriented to person, place, and time.   Skin: Skin is warm and dry.  Psychiatric: She has a normal mood and affect. Her behavior is normal. Judgment and thought content normal.   US Ob Comp Less 14 Wks  11/10/2014   CLINICAL DATA:  Two weeks of right lower quadrant pain in the setting of a positive home pregnancy test ; patient passed a vaginal blood clot last evening and hand sub persistent spotting but no bleeding today  EXAM: OBSTETRIC <14 WK [place] AND TRANSVAGINAL OB US  TECHNIQUE: Both transabdominal and transvaginal ultrasound examinations were performed for complete evaluation of the gestation as well as the maternal uterus, adnexal regions, and pelvic cul-de-sac. Transvaginal technique was performed to assess early pregnancy.  COMPARISON:  None.  FINDINGS: Intrauterine gestational sac: None visualized  Yolk sac:  None  Embryo:  None  Cardiac Activity: None  Heart Rate: n/a  bpm  Maternal uterus/adnexae:  Right ovary: There is a 2.2 x 2.0 x 1.9 cm complex appearing structure with peripheral increased vascularity in the right ovary. There also developing follicles within the right ovary.  Left ovary: The left ovary was visible on the transabdominal portion of the study only. It appeared normal in size and echotexture.  Uterus: The endometrial stripe is thickened at 13 mm. A small amount of fluid is present in the lower uterine segment  IMPRESSION: 1. No IUP is demonstrated. There is thickening of the endometrium and a small amount of fluid in the lower uterine segment. 2. There is a complex structure in the right ovary-adnexal region exhibiting peripheral increased vascularity. An ectopic pregnancy is not excluded. Follow-up beta HCG determinations and follow-up ultrasound examinations as needed are recommended.   Electronically Signed   By: David  [place]   On: 11/10/2014 14:33   US Ob Transvaginal  11/10/2014   CLINICAL DATA:  Two weeks of right lower quadrant pain in the setting of a positive home pregnancy test ; patient passed a vaginal  blood clot last evening and hand sub persistent spotting but no bleeding today  EXAM: OBSTETRIC <14 WK [place] AND TRANSVAGINAL OB US  TECHNIQUE: Both transabdominal and transvaginal ultrasound examinations were performed for complete evaluation of the gestation as well as the maternal uterus, adnexal regions, and pelvic cul-de-sac. Transvaginal technique was performed to assess early pregnancy.  COMPARISON:  None.  FINDINGS: Intrauterine gestational sac: None visualized  Yolk sac:  None  Embryo:  None  Cardiac Activity: None  Heart Rate: n/a  bpm  Maternal uterus/adnexae:  Right ovary: There is a 2.2 x 2.0 x 1.9 cm complex appearing structure with peripheral increased vascularity in the right ovary. There also developing follicles within the right ovary.  Left ovary: The left ovary was visible on the transabdominal portion of the study only. It appeared normal in size and echotexture.  Uterus: The endometrial stripe is thickened at 13 mm. A small amount of fluid is present in  the lower uterine segment  IMPRESSION: 1. No IUP is demonstrated. There is thickening of the endometrium and a small amount of fluid in the lower uterine segment. 2. There is a complex structure in the right ovary-adnexal region exhibiting peripheral increased vascularity. An ectopic pregnancy is not excluded. Follow-up beta HCG determinations and follow-up ultrasound examinations as needed are recommended.   Electronically Signed   By: David  [place]   On: 11/10/2014 14:33   Results for orders placed or performed during the hospital encounter of 11/10/14 (from the past 24 hour(s))  Urinalysis, Routine w reflex microscopic     Status: None   Collection Time: 11/10/14 11:50 AM  Result Value Ref Range   Color, Urine YELLOW YELLOW   APPearance CLEAR CLEAR   Specific Gravity, Urine 1.024 1.005 - 1.030   pH 5.5 5.0 - 8.0   Glucose, UA NEGATIVE NEGATIVE mg/dL   Hgb urine dipstick NEGATIVE NEGATIVE   Bilirubin Urine NEGATIVE NEGATIVE    Ketones, ur NEGATIVE NEGATIVE mg/dL   Protein, ur NEGATIVE NEGATIVE mg/dL   Urobilinogen, UA 0.2 0.0 - 1.0 mg/dL   Nitrite NEGATIVE NEGATIVE   Leukocytes, UA NEGATIVE NEGATIVE  Pregnancy, urine     Status: Abnormal   Collection Time: 11/10/14 11:50 AM  Result Value Ref Range   Preg Test, Ur POSITIVE (A) NEGATIVE  Wet prep, genital     Status: Abnormal   Collection Time: 11/10/14  1:40 PM  Result Value Ref Range   Yeast Wet Prep HPF POC NONE SEEN NONE SEEN   Trich, Wet Prep NONE SEEN NONE SEEN   Clue Cells Wet Prep HPF POC FEW (A) NONE SEEN   WBC, Wet Prep HPF POC FEW (A) NONE SEEN  hCG, quantitative, pregnancy     Status: Abnormal   Collection Time: 11/10/14  2:20 PM  Result Value Ref Range   hCG, Beta Chain, Quant, S 240 (H) <5 mIU/mL   O Negative Blood Type MAU Course  Procedures  MDM Reviewed POC with Dr Burnice Logan Katrinka Blazing Repeat Beta HCG at this facility Rhogam Shot  Assessment and Plan  Early IUP Abdominal pain in pregnancy Possible ectopic  Repeat Beta HCG on Sunday ( 48 hours) Ectopic precautions reviewed with pt Pt agreeable to POC Discharge to home  [facility] 11/10/2014, 4:46 PM

## 2014-11-10 NOTE — MAU Note (Signed)
Pt sent over from urgent care for suspected ectopic pregnancy. Pt reports pain and cramping has increased since her u/s. Denies any vag bleeding. Has had n/v all week not been eating well.

## 2014-11-10 NOTE — ED Notes (Signed)
Pt has to leave at this time to go get her children from school- was trying but unable to find anyone to pick them up- Dr Anitra LauthPlunkett notified. Advised pt of importance of returning here or [facility]. Contact number: [phone]

## 2014-11-10 NOTE — ED Provider Notes (Addendum)
CSN: [id number]     Arrival date & time 11/10/14  1134 History   First MD Initiated Contact with Patient 11/10/14 1212     Chief Complaint  Patient presents with  . Vaginal Bleeding  . [redacted] weeks pregnant      (Consider location/radiation/quality/duration/timing/severity/associated sxs/prior Treatment) Patient is a 29 y.o. female presenting with vaginal bleeding. The history is provided by the patient.  Vaginal Bleeding Quality:  Clots Severity:  Mild Onset quality:  Sudden Duration:  1 day Timing:  Rare Progression:  Resolved Chronicity:  New Menstrual history:  Regular Possible pregnancy: yes   Context: spontaneously   Relieved by:  None tried Worsened by:  Nothing tried Ineffective treatments:  None tried Associated symptoms: abdominal pain and nausea   Associated symptoms: no back pain, no dysuria and no vaginal discharge   Risk factors: unprotected sex   Risk factors: no hx of ectopic pregnancy, does not have multiple partners, no new sexual partner, no ovarian cysts and no STD     Past Medical History  Diagnosis Date  . Genital warts   . Anxiety 11/21/2011  . Anemia   . Complication of anesthesia     Swelling from some drug used for anesthesia. Does not  know name.  . Genital warts   . HPV test positive   . RhD negative    Past Surgical History  Procedure Laterality Date  . Fractured finger  2001    open reduction  . Therapeutic abortion    . Cesarean section  2007, 2009, 2010    x3   Family History  Problem Relation Age of Onset  . Heart disease Father   . Hyperlipidemia Father    History  Substance Use Topics  . Smoking status: Former Smoker -- 1.00 packs/day for 8 years    Types: Cigarettes    Quit date: 02/21/2014  . Smokeless tobacco: Never Used  . Alcohol Use: Yes     Comment: socially   OB History    Gravida Para Term Preterm AB TAB SAB Ectopic Multiple Living   8 3  3 4 1 3   3      Review of Systems  Gastrointestinal: Positive for nausea  and abdominal pain.  Genitourinary: Positive for vaginal bleeding. Negative for dysuria and vaginal discharge.  Musculoskeletal: Negative for back pain.  All other systems reviewed and are negative.     Allergies  Latex  Home Medications   Prior to Admission medications   Medication Sig Start Date End Date Taking? Authorizing Provider  ALPRAZolam (XANAX PO) Take by mouth.   Yes Historical Provider, MD  methocarbamol (ROBAXIN) 500 MG tablet Take 1 tablet (500 mg total) by mouth 2 (two) times daily. 02/28/14   Tiffany Neva SeatGreene, PA-C  naproxen (NAPROSYN) 375 MG tablet Take 1 tablet (375 mg total) by mouth 2 (two) times daily. 02/28/14   Marlon Peliffany Greene, PA-C  oxyCODONE-acetaminophen (PERCOCET/ROXICET) 5-325 MG per tablet Take 1-2 tablets by mouth every 6 (six) hours as needed. 02/28/14   Tiffany Neva SeatGreene, PA-C   BP 113/62 mmHg  Pulse 79  Temp(Src) 98.6 F (37 C) (Oral)  Resp 20  Ht 4\' 11"  (1.499 m)  Wt 107 lb (48.535 kg)  BMI 21.60 kg/m2  SpO2 99%  LMP 10/04/2014 Physical Exam  Constitutional: She is oriented to person, place, and time. She appears well-developed and well-nourished. No distress.  HENT:  Head: Normocephalic and atraumatic.  Eyes: EOM are normal. Pupils are equal, round, and reactive to light.  Cardiovascular: Normal rate, regular rhythm, normal heart sounds and intact distal pulses.  Exam reveals no friction rub.   No murmur heard. Pulmonary/Chest: Effort normal and breath sounds normal. She has no wheezes. She has no rales.  Abdominal: Soft. Bowel sounds are normal. She exhibits no distension. There is tenderness in the suprapubic area. There is no rebound and no guarding.    Right pelvic pain  Genitourinary: Uterus normal.    Cervix exhibits no motion tenderness and no discharge. Right adnexum displays tenderness. Right adnexum displays no mass and no fullness. Left adnexum displays no mass, no tenderness and no fullness. No tenderness or bleeding in the vagina.    Musculoskeletal: Normal range of motion. She exhibits no tenderness.  No edema  Neurological: She is alert and oriented to person, place, and time. No cranial nerve deficit.  Skin: Skin is warm and dry. No rash noted.  Psychiatric: She has a normal mood and affect. Her behavior is normal.  Nursing note and vitals reviewed.   ED Course  Procedures (including critical care time) Labs Review Labs Reviewed  WET PREP, GENITAL - Abnormal; Notable for the following:    Clue Cells Wet Prep HPF POC FEW (*)    WBC, Wet Prep HPF POC FEW (*)    All other components within normal limits  PREGNANCY, URINE - Abnormal; Notable for the following:    Preg Test, Ur POSITIVE (*)    All other components within normal limits  URINALYSIS, ROUTINE W REFLEX MICROSCOPIC  HCG, QUANTITATIVE, PREGNANCY  GC/CHLAMYDIA PROBE AMP ([place])    Imaging Review US Ob Comp Less 14 Wks  11/10/2014   CLINICAL DATA:  Two weeks of right lower quadrant pain in the setting of a positive home pregnancy test ; patient passed a vaginal blood clot last evening and hand sub persistent spotting but no bleeding today  EXAM: OBSTETRIC <14 WK [place] AND TRANSVAGINAL OB US  TECHNIQUE: Both transabdominal and transvaginal ultrasound examinations were performed for complete evaluation of the gestation as well as the maternal uterus, adnexal regions, and pelvic cul-de-sac. Transvaginal technique was performed to assess early pregnancy.  COMPARISON:  None.  FINDINGS: Intrauterine gestational sac: None visualized  Yolk sac:  None  Embryo:  None  Cardiac Activity: None  Heart Rate: n/a  bpm  Maternal uterus/adnexae:  Right ovary: There is a 2.2 x 2.0 x 1.9 cm complex appearing structure with peripheral increased vascularity in the right ovary. There also developing follicles within the right ovary.  Left ovary: The left ovary was visible on the transabdominal portion of the study only. It appeared normal in size and echotexture.  Uterus: The  endometrial stripe is thickened at 13 mm. A small amount of fluid is present in the lower uterine segment  IMPRESSION: 1. No IUP is demonstrated. There is thickening of the endometrium and a small amount of fluid in the lower uterine segment. 2. There is a complex structure in the right ovary-adnexal region exhibiting peripheral increased vascularity. An ectopic pregnancy is not excluded. Follow-up beta HCG determinations and follow-up ultrasound examinations as needed are recommended.   Electronically Signed   By: David  [place]   On: 11/10/2014 14:33   US Ob Transvaginal  11/10/2014   CLINICAL DATA:  Two weeks of right lower quadrant pain in the setting of a positive home pregnancy test ; patient passed a vaginal blood clot last evening and hand sub persistent spotting but no bleeding today  EXAM: OBSTETRIC <14 WK [place]  AND TRANSVAGINAL OB US  TECHNIQUE: Both transabdominal and transvaginal ultrasound examinations were performed for complete evaluation of the gestation as well as the maternal uterus, adnexal regions, and pelvic cul-de-sac. Transvaginal technique was performed to assess early pregnancy.  COMPARISON:  None.  FINDINGS: Intrauterine gestational sac: None visualized  Yolk sac:  None  Embryo:  None  Cardiac Activity: None  Heart Rate: n/a  bpm  Maternal uterus/adnexae:  Right ovary: There is a 2.2 x 2.0 x 1.9 cm complex appearing structure with peripheral increased vascularity in the right ovary. There also developing follicles within the right ovary.  Left ovary: The left ovary was visible on the transabdominal portion of the study only. It appeared normal in size and echotexture.  Uterus: The endometrial stripe is thickened at 13 mm. A small amount of fluid is present in the lower uterine segment  IMPRESSION: 1. No IUP is demonstrated. There is thickening of the endometrium and a small amount of fluid in the lower uterine segment. 2. There is a complex structure in the right ovary-adnexal region  exhibiting peripheral increased vascularity. An ectopic pregnancy is not excluded. Follow-up beta HCG determinations and follow-up ultrasound examinations as needed are recommended.   Electronically Signed   By: David  [place]   On: 11/10/2014 14:33     EKG Interpretation None      MDM   Final diagnoses:  Vaginal bleeding  Pregnant  Ectopic pregnancy  Rh alloimmunization, maternal, antepartum, first trimester, fetus 4    Patient with a history of lower abdominal tenderness for the last 1 week worse on the right with passage of a blood clot yesterday. Patient to the pregnancy test a few days ago which was positive. She states her last normal period was in February. As any discharge, fever or change in bowel movements or urine. Patient was found to have a positive pregnancy and on pelvic has significant right adnexal tenderness. Will get a transvaginal ultrasound to rule out ectopic pregnancy or adnexal pathology. UA negative for infection. Patient is known Rh- and states is received program for all of her prior pregnancies. Given the history of bleeding although no blood present currently will need a low dose of rhogam. HCG and ultrasound pending  2:53 PM Patient had a transvaginal ultrasound done however prior to the results she had to leave to pick her kids up at school. She was going to return to get her rhogam shot and ultrasound results.  Ultrasound results came back with concern for an ectopic pregnancy in the right adnexa which is where she is having tenderness. I called the patient by phone and instructed her to go [facility] the patient coming.  Gwyneth Sprout, MD 11/10/14 1456  Gwyneth Sprout, MD 11/10/14 1456

## 2014-11-11 LAB — RH IG WORKUP (INCLUDES ABO/RH)
ABO/RH(D): O NEG
Antibody Screen: NEGATIVE
Gestational Age(Wks): 5
Unit division: 0

## 2014-11-11 LAB — GC/CHLAMYDIA PROBE AMP (~~LOC~~) NOT AT ARMC
CHLAMYDIA, DNA PROBE: NEGATIVE
Neisseria Gonorrhea: NEGATIVE

## 2014-11-12 ENCOUNTER — Inpatient Hospital Stay (HOSPITAL_COMMUNITY)
Admission: AD | Admit: 2014-11-12 | Discharge: 2014-11-12 | Disposition: A | Discharge status: Home / Self Care | Payer: Self-pay | Source: Ambulatory Visit | Attending: Obstetrics & Gynecology | Admitting: Obstetrics & Gynecology

## 2014-11-12 DIAGNOSIS — R109 Unspecified abdominal pain: Secondary | ICD-10-CM | POA: Insufficient documentation

## 2014-11-12 DIAGNOSIS — O26899 Other specified pregnancy related conditions, unspecified trimester: Secondary | ICD-10-CM

## 2014-11-12 DIAGNOSIS — O9989 Other specified diseases and conditions complicating pregnancy, childbirth and the puerperium: Secondary | ICD-10-CM | POA: Insufficient documentation

## 2014-11-12 DIAGNOSIS — O0281 Inappropriate change in quantitative human chorionic gonadotropin (hCG) in early pregnancy: Secondary | ICD-10-CM

## 2014-11-12 DIAGNOSIS — Z3A01 Less than 8 weeks gestation of pregnancy: Secondary | ICD-10-CM | POA: Insufficient documentation

## 2014-11-12 LAB — HCG, QUANTITATIVE, PREGNANCY: hCG, Beta Chain, Quant, S: 503 m[IU]/mL — ABNORMAL HIGH (ref <5)

## 2014-11-12 NOTE — MAU Note (Signed)
Pt states here for repeat bloodwork. Denies pain or bleeding. Has felt lower abdominal pressure bilaterally, however no pain.

## 2014-11-12 NOTE — MAU Provider Note (Signed)
Subjective:  Ms. Thornell MuleMelissa F Conradt is a 29 y.o. female [phone] at 10652w2d who presents for a follow up beta hcg level. She currently does not have any pain or bleeding and this pregnancy is highly desired. She had an US done on 4/7 and she was told that an ectopic could not be ruled out.    Objective:  GENERAL: Well-developed, well-nourished female in no acute distress.  LUNGS: Effort normal SKIN: Warm, dry and without erythema PSYCH: Normal mood and affect  Filed Vitals:   11/12/14 1211  BP: 96/54  Pulse: 93  Temp: 99 F (37.2 C)  TempSrc: Oral  Resp: 16  Height: 4\' 9"  (1.448 m)  Weight: 50.463 kg (111 lb 4 oz)    MDM:  4/7: 229 4/9: 503  Assessment:  Appropriate rise in beta hcg level Pregnancy of unknown location.     Plan:  Discharge home in stable condition Pelvic rest Ectopic precautions discussed at length. Patient is to return to MAU immediately with any pain or bleeding US Friday for viability (6days)    Duane LopeJennifer I Lee-Anne Flicker, NP 11/12/2014 1:18 PM

## 2014-11-12 NOTE — Discharge Instructions (Signed)

## 2014-11-18 ENCOUNTER — Ambulatory Visit (HOSPITAL_COMMUNITY)
Admission: RE | Admit: 2014-11-18 | Discharge: 2014-11-18 | Disposition: A | Discharge status: Home / Self Care | Payer: Self-pay | Source: Ambulatory Visit | Attending: Obstetrics and Gynecology | Admitting: Obstetrics and Gynecology

## 2014-11-18 ENCOUNTER — Inpatient Hospital Stay (HOSPITAL_COMMUNITY)
Admission: AD | Admit: 2014-11-18 | Discharge: 2014-11-18 | Disposition: A | Discharge status: Home / Self Care | Payer: Self-pay | Source: Ambulatory Visit | Attending: Obstetrics & Gynecology | Admitting: Obstetrics & Gynecology

## 2014-11-18 DIAGNOSIS — O9989 Other specified diseases and conditions complicating pregnancy, childbirth and the puerperium: Secondary | ICD-10-CM | POA: Insufficient documentation

## 2014-11-18 DIAGNOSIS — O3680X0 Pregnancy with inconclusive fetal viability, not applicable or unspecified: Secondary | ICD-10-CM

## 2014-11-18 DIAGNOSIS — O0281 Inappropriate change in quantitative human chorionic gonadotropin (hCG) in early pregnancy: Secondary | ICD-10-CM | POA: Insufficient documentation

## 2014-11-18 DIAGNOSIS — R109 Unspecified abdominal pain: Secondary | ICD-10-CM | POA: Insufficient documentation

## 2014-11-18 DIAGNOSIS — O26899 Other specified pregnancy related conditions, unspecified trimester: Secondary | ICD-10-CM

## 2014-11-18 NOTE — MAU Provider Note (Signed)
Patient to MAU for review of today's ultrasound findings.  She denies problems including vaginal bleeding or abdominal pain.  Gestational age is 5 weeks based on diameter of gesational sac only.  No yolk sac and no embryo were seen today.   All questions answered.  Patient is very frustrated that she does not have more information and that she has been here an hour and a half although she indicates she does not need anything further.   Discharge to home.   Return for U/S in 7-10 days to confirm viability.  If any additional bleeding/pain/other emergency - return to MAU right away.

## 2014-11-21 ENCOUNTER — Encounter (HOSPITAL_COMMUNITY): Payer: Self-pay | Admitting: *Deleted

## 2014-11-21 ENCOUNTER — Inpatient Hospital Stay (HOSPITAL_COMMUNITY)
Admission: AD | Admit: 2014-11-21 | Discharge: 2014-11-21 | Disposition: A | Discharge status: Home / Self Care | Payer: Self-pay | Source: Ambulatory Visit | Attending: Obstetrics and Gynecology | Admitting: Obstetrics and Gynecology

## 2014-11-21 DIAGNOSIS — Z87891 Personal history of nicotine dependence: Secondary | ICD-10-CM | POA: Insufficient documentation

## 2014-11-21 DIAGNOSIS — R42 Dizziness and giddiness: Secondary | ICD-10-CM | POA: Insufficient documentation

## 2014-11-21 DIAGNOSIS — O9989 Other specified diseases and conditions complicating pregnancy, childbirth and the puerperium: Secondary | ICD-10-CM | POA: Insufficient documentation

## 2014-11-21 DIAGNOSIS — Z3A01 Less than 8 weeks gestation of pregnancy: Secondary | ICD-10-CM | POA: Insufficient documentation

## 2014-11-21 DIAGNOSIS — R5383 Other fatigue: Secondary | ICD-10-CM | POA: Insufficient documentation

## 2014-11-21 LAB — URINALYSIS, ROUTINE W REFLEX MICROSCOPIC
BILIRUBIN URINE: NEGATIVE
Glucose, UA: NEGATIVE mg/dL
Ketones, ur: 15 mg/dL — AB
Leukocytes, UA: NEGATIVE
Nitrite: NEGATIVE
Protein, ur: NEGATIVE mg/dL
Specific Gravity, Urine: 1.02 (ref 1.005–1.030)
UROBILINOGEN UA: 0.2 mg/dL (ref 0.0–1.0)
pH: 5.5 (ref 5.0–8.0)

## 2014-11-21 LAB — URINE MICROSCOPIC-ADD ON

## 2014-11-21 LAB — HCG, QUANTITATIVE, PREGNANCY: HCG, BETA CHAIN, QUANT, S: 18508 m[IU]/mL — AB (ref <5)

## 2014-11-21 MED ORDER — PRENATAL VITAMINS 28-0.8 MG PO TABS: 1.0000 | ORAL_TABLET | Freq: Every day | ORAL | Status: DC

## 2014-11-21 NOTE — Discharge Instructions (Signed)

## 2014-11-21 NOTE — MAU Note (Signed)
This morning she states she felt a pain in her lower abdomen that woke her up, lasted about 30 seconds. When she got up she felt dizzy like she was going to pass out. Feels weak and SOB. Denies pain or bleeding.

## 2014-11-21 NOTE — MAU Provider Note (Signed)
History     CSN: [id number]  Arrival date and time: 11/21/14 1121   First Provider Initiated Contact with Patient 11/21/14 1149      Chief Complaint  Patient presents with  . Dizziness  . Fatigue   HPI :   [name] is a 29 year old [id number], currently 1243w4d presenting to Maternity Admissions for dizziness, lightheadedness, and weakness that started at 6:30 this morning. Patient reports she had a "crampy" abdominal pain that lasted 30 seconds and woke her from sleep at that time. When she started walking she felt like she was going to pass out but denies any loss of consciousness or falls. She reports still feeling like the room is spinning if she moves her head too quickly. She also reports associated shortness of breath and chills she experienced this morning with her initial dizziness, but credits that to the anxiety she was feeling at that time. She reports her last meal was at 10:00 PM last night, and she has not consumed any food all day. She reports staying well hydrated and consuming 6+ glasses of water per day. She reports her urine has been a pale yellow and her last bowel movement was at 1:00 AM this morning. She denies any vaginal bleeding or loss of fluids.  Currently she denies abdominal pain.    OB History    Gravida Para Term Preterm AB TAB SAB Ectopic Multiple Living   8 3  3 4 1 3   3       Past Medical History  Diagnosis Date  . Genital warts   . Anxiety 11/21/2011  . Anemia   . Complication of anesthesia     Swelling from some drug used for anesthesia. Does not  know name.  . Genital warts   . HPV test positive   . RhD negative     Past Surgical History  Procedure Laterality Date  . Fractured finger  2001    open reduction  . Therapeutic abortion    . Cesarean section  2007, 2009, 2010    x3    Family History  Problem Relation Age of Onset  . Heart disease Father   . Hyperlipidemia Father     History  Substance Use Topics  . Smoking  status: Former Smoker -- 1.00 packs/day for 8 years    Types: Cigarettes    Quit date: 02/21/2014  . Smokeless tobacco: Never Used  . Alcohol Use: Yes     Comment: socially    Allergies:  Allergies  Allergen Reactions  . Latex Swelling and Other (See Comments)    redness    No prescriptions prior to admission   Results for orders placed or performed during the hospital encounter of 11/21/14 (from the past 48 hour(s))  Urinalysis, Routine w reflex microscopic     Status: Abnormal   Collection Time: 11/21/14 11:23 AM  Result Value Ref Range   Color, Urine YELLOW YELLOW   APPearance CLEAR CLEAR   Specific Gravity, Urine 1.020 1.005 - 1.030   pH 5.5 5.0 - 8.0   Glucose, UA NEGATIVE NEGATIVE mg/dL   Hgb urine dipstick TRACE (A) NEGATIVE   Bilirubin Urine NEGATIVE NEGATIVE   Ketones, ur 15 (A) NEGATIVE mg/dL   Protein, ur NEGATIVE NEGATIVE mg/dL   Urobilinogen, UA 0.2 0.0 - 1.0 mg/dL   Nitrite NEGATIVE NEGATIVE   Leukocytes, UA NEGATIVE NEGATIVE  Urine microscopic-add on     Status: Abnormal   Collection Time: 11/21/14 11:23 AM  Result Value Ref Range   Squamous Epithelial / LPF FEW (A) RARE   RBC / HPF 0-2 <3 RBC/hpf   Bacteria, UA RARE RARE  hCG, quantitative, pregnancy     Status: Abnormal   Collection Time: 11/21/14 12:21 PM  Result Value Ref Range   hCG, Beta Chain, Quant, S 18508 (H) <5 mIU/mL    Comment:          GEST. AGE      CONC.  (mIU/mL)   <=1 WEEK        5 - 50     2 WEEKS       50 - 500     3 WEEKS       100 - 10,000     4 WEEKS     1,000 - 30,000     5 WEEKS     3,500 - 115,000   6-8 WEEKS     12,000 - 270,000    12 WEEKS     15,000 - 220,000        FEMALE AND NON-PREGNANT FEMALE:     LESS THAN 5 mIU/mL     Review of Systems  Constitutional: Positive for chills and malaise/fatigue. Negative for fever and weight loss.  HENT: Negative for congestion, hearing loss, sore throat and tinnitus.   Eyes: Negative for blurred vision, double vision and pain.   Respiratory: Positive for shortness of breath. Negative for cough, sputum production and wheezing.   Cardiovascular: Positive for palpitations. Negative for chest pain.  Gastrointestinal: Positive for nausea, vomiting and abdominal pain. Negative for diarrhea, constipation and blood in stool.  Genitourinary: Negative for dysuria, urgency and frequency.  Neurological: Positive for dizziness and weakness. Negative for focal weakness, seizures, loss of consciousness and headaches.   Physical Exam   Blood pressure 99/55, pulse 73, temperature 98.8 F (37.1 C), temperature source Oral, resp. rate 18, last menstrual period 10/06/2014, SpO2 100 %.   Today's Vitals   11/21/14 1136 11/21/14 1137 11/21/14 1139  BP:  Pulse: 71 72 73  Temp: 98.8 F (37.1 C)    TempSrc: Oral    Resp: 18    SpO2:  100% 100%    Physical Exam  Nursing note and vitals reviewed. Constitutional: She is oriented to person, place, and time. She appears well-developed and well-nourished. No distress.  HENT:  Head: Normocephalic and atraumatic.  Eyes: Pupils are equal, round, and reactive to light. Right eye exhibits no discharge. Left eye exhibits no discharge.  Neck: Normal range of motion. Neck supple. No thyromegaly present.  Cardiovascular: Normal rate, regular rhythm, normal heart sounds and intact distal pulses.  Exam reveals no gallop and no friction rub.   No murmur heard. Respiratory: Effort normal and breath sounds normal. No stridor. No respiratory distress. She has no wheezes. She has no rales. She exhibits no tenderness.  GI: Soft. Bowel sounds are normal. She exhibits no distension and no mass. There is tenderness (Mild tenderness in suprapubic region). There is no rebound and no guarding.  Musculoskeletal: Normal range of motion. She exhibits no edema or tenderness.  Lymphadenopathy:    She has no cervical adenopathy.  Neurological: She is alert and oriented to person, place, and  time.  Skin: Skin is warm and dry. No rash noted. She is not diaphoretic. No erythema. No pallor.    MAU Course  Procedures  MDM Patient was not orthostatic.  Patient has [place] 3 days ago that showed single gestational  sac with no visible embryo or yolk sac. Patient had been informed at that time to have a follow-up US in 14 days. [place] is do to call her today to schedule follow up US.   Patient's quant hCG was 503 9 days ago and is appropriately rising at 18,508 today.  Patient had been told not to consume any food due to the chance we might have to obtain another [place] today, but she consumed food her fiance brought to her room anyway. Patient reports she is feeling better after consuming food and water and is ready for discharge.    Assessment and Plan  A:  Dizziness and Fatigue P:  Patient encouraged to have her follow-up US in 11 days. Patient encouraged to call us if not scheduled in the next 24 hours.   Informed of ectopic precautions and given conditions upon which to return to the MAU for. Including pelvic rest.   Informed patient of importance of adequate nutrition and hydration.   Duane Lope, NP 11/21/2014 2:00 PM

## 2014-11-30 ENCOUNTER — Inpatient Hospital Stay (HOSPITAL_COMMUNITY): Payer: Self-pay

## 2014-11-30 ENCOUNTER — Encounter (HOSPITAL_COMMUNITY): Payer: Self-pay | Admitting: *Deleted

## 2014-11-30 ENCOUNTER — Inpatient Hospital Stay (HOSPITAL_COMMUNITY)
Admission: AD | Admit: 2014-11-30 | Discharge: 2014-11-30 | Disposition: A | Discharge status: Home / Self Care | Payer: Self-pay | Source: Ambulatory Visit | Attending: Obstetrics and Gynecology | Admitting: Obstetrics and Gynecology

## 2014-11-30 DIAGNOSIS — O9989 Other specified diseases and conditions complicating pregnancy, childbirth and the puerperium: Secondary | ICD-10-CM | POA: Insufficient documentation

## 2014-11-30 DIAGNOSIS — Z3A01 Less than 8 weeks gestation of pregnancy: Secondary | ICD-10-CM | POA: Insufficient documentation

## 2014-11-30 DIAGNOSIS — O26899 Other specified pregnancy related conditions, unspecified trimester: Secondary | ICD-10-CM

## 2014-11-30 DIAGNOSIS — Z87891 Personal history of nicotine dependence: Secondary | ICD-10-CM | POA: Insufficient documentation

## 2014-11-30 DIAGNOSIS — F419 Anxiety disorder, unspecified: Secondary | ICD-10-CM | POA: Insufficient documentation

## 2014-11-30 DIAGNOSIS — R109 Unspecified abdominal pain: Secondary | ICD-10-CM | POA: Insufficient documentation

## 2014-11-30 DIAGNOSIS — O99341 Other mental disorders complicating pregnancy, first trimester: Secondary | ICD-10-CM | POA: Insufficient documentation

## 2014-11-30 LAB — URINALYSIS, ROUTINE W REFLEX MICROSCOPIC
Bilirubin Urine: NEGATIVE
Glucose, UA: NEGATIVE mg/dL
Hgb urine dipstick: NEGATIVE
Ketones, ur: NEGATIVE mg/dL
LEUKOCYTES UA: NEGATIVE
Nitrite: NEGATIVE
PH: 6 (ref 5.0–8.0)
Protein, ur: NEGATIVE mg/dL
SPECIFIC GRAVITY, URINE: 1.025 (ref 1.005–1.030)
Urobilinogen, UA: 0.2 mg/dL (ref 0.0–1.0)

## 2014-11-30 MED ORDER — HYDROXYZINE PAMOATE 25 MG PO CAPS: 25.0000 mg | ORAL_CAPSULE | Freq: Three times a day (TID) | ORAL | Status: DC | PRN

## 2014-11-30 MED ORDER — HYDROXYZINE HCL 25 MG PO TABS
25.0000 mg | ORAL_TABLET | Freq: Once | ORAL | Status: AC
  Administered 2014-11-30: 25 mg via ORAL
  Filled 2014-11-30: qty 1

## 2014-11-30 NOTE — MAU Note (Signed)
Urine in lab 

## 2014-11-30 NOTE — MAU Provider Note (Signed)
History     CSN: [id number]  Arrival date and time: 11/30/14 4098   First Provider Initiated Contact with Patient 11/30/14 1004      Chief Complaint  Patient presents with  . Abdominal Pain  . Vaginal Bleeding   HPI   [name] is a 29 y.o. female [phone] at [redacted]w[redacted]d who presents with abdominal pain and vaginal bleeding. She was here on 4/15 and had appropriate rise in beta hcg levels. She was told she would need another Korea in 7 days; she was told that someone would call her to schedule. She never received a call for the [place].   She denies bleeding currently, however continues to have right sided abdominal pain. The pain is constant, sharp and shooting. She has not tried anything over the counter for the pain.   She is also here because she is struggling with anxiety and depression. She is crying everyday and feels that she is not able to control the symptoms on her own. She was taking Zoloft at one point but felt it stopped being effective so she stopped it. She has a lot of her plate at home and she feels this triggers her anxiety. For the most part she feels she can do deep breathing exercises to help, although at times it does not seem to help.   OB History    Gravida Para Term Preterm AB TAB SAB Ectopic Multiple Living   Past Medical History  Diagnosis Date  . Genital warts   . Anxiety 11/21/2011  . Anemia   . Complication of anesthesia     Swelling from some drug used for anesthesia. Does not  know name.  . Genital warts   . HPV test positive   . RhD negative     Past Surgical History  Procedure Laterality Date  . Fractured finger  2001    open reduction  . Therapeutic abortion    . Cesarean section  2007, 2009, 2010    x3    Family History  Problem Relation Age of Onset  . Heart disease Father   . Hyperlipidemia Father     History  Substance Use Topics  . Smoking status: Former Smoker -- 1.00 packs/day for 8 years    Types:  Cigarettes    Quit date: 02/21/2014  . Smokeless tobacco: Never Used  . Alcohol Use: Yes     Comment: socially    Allergies:  Allergies  Allergen Reactions  . Latex Swelling and Other (See Comments)    redness    Prescriptions prior to admission  Medication Sig Dispense Refill Last Dose  . OVER THE COUNTER MEDICATION Take 1 tablet by mouth daily. Patient takes over the counter prenatal vitamin   11/29/2014 at Unknown time  . Prenatal Vit-Fe Fumarate-FA (PRENATAL VITAMINS) 28-0.8 MG TABS Take 1 tablet by mouth daily. (Patient not taking: Reported on 11/30/2014) 30 tablet 3    Results for orders placed or performed during the hospital encounter of 11/30/14 (from the past 48 hour(s))  Urinalysis, Routine w reflex microscopic     Status: None   Collection Time: 11/30/14  9:20 AM  Result Value Ref Range   Color, Urine YELLOW YELLOW   APPearance CLEAR CLEAR   Specific Gravity, Urine 1.025 1.005 - 1.030   pH 6.0 5.0 - 8.0   Glucose, UA NEGATIVE NEGATIVE mg/dL   Hgb urine dipstick NEGATIVE  NEGATIVE   Bilirubin Urine NEGATIVE NEGATIVE   Ketones, ur NEGATIVE NEGATIVE mg/dL   Protein, ur NEGATIVE NEGATIVE mg/dL   Urobilinogen, UA 0.2 0.0 - 1.0 mg/dL   Nitrite NEGATIVE NEGATIVE   Leukocytes, UA NEGATIVE NEGATIVE    Comment: MICROSCOPIC NOT DONE ON URINES WITH NEGATIVE PROTEIN, BLOOD, LEUKOCYTES, NITRITE, OR GLUCOSE <1000 mg/dL.   [place] Ob Transvaginal  11/30/2014   CLINICAL DATA:  Right lower quadrant pain for 1 day. Passing out after anxiety attack.  EXAM: TRANSVAGINAL OB ULTRASOUND  TECHNIQUE: Transvaginal ultrasound was performed for complete evaluation of the gestation as well as the maternal uterus, adnexal regions, and pelvic cul-de-sac.  COMPARISON:  11/18/2014  FINDINGS: Intrauterine gestational sac: Single  Yolk sac:  Present  Embryo:  Present  Cardiac Activity: Present  Heart Rate: 120 bpm  CRL:   6.8  mm   6 w 4d                  US EDC: 07/22/2015.  Maternal uterus/adnexae:   Subchorionic hemorrhage: None  Right ovary: Normal.  Corpus luteal cyst noted.  Left ovary: Normal  Other :C-section scar is identified within the lower uterine segment.  Free fluid:  Small amount is noted within the right adnexal region  IMPRESSION: 1. Single living intrauterine gestation. The estimated gestational age is 6 weeks and 4 days.   Electronically Signed   By: Signa Kellaylor  Stroud M.D.   On: 11/30/2014 11:25    Review of Systems  Constitutional: Negative for fever and chills.  Gastrointestinal: Positive for abdominal pain.  Psychiatric/Behavioral: Positive for depression. Negative for suicidal ideas, hallucinations, memory loss and substance abuse. The patient is nervous/anxious. The patient does not have insomnia.    Physical Exam   Blood pressure 102/55, pulse 65, temperature 98.4 F (36.9 C), temperature source Oral, resp. rate 16, weight 51.256 kg (113 lb), last menstrual period 10/06/2014.  Physical Exam  Constitutional: She is oriented to person, place, and time. She appears well-developed and well-nourished. No distress.  HENT:  Head: Normocephalic.  Eyes: Pupils are equal, round, and reactive to light.  Neck: Neck supple.  Genitourinary:  Cervix closed, no blood noted on exam   Neurological: She is alert and oriented to person, place, and time.  Skin: She is not diaphoretic.  Psychiatric: Her speech is normal and behavior is normal. Judgment and thought content normal. Her mood appears anxious. Her affect is not inappropriate. Thought content is not paranoid and not delusional. Cognition and memory are normal. She exhibits a depressed mood. She expresses no homicidal and no suicidal ideation. She expresses no suicidal plans and no homicidal plans.    MAU Course  Procedures  None  MDM  US Vistaril 25 mg PO  O negative blood type patient received rhogam   Assessment and Plan   A:  SIUP @[redacted]w[redacted]d  with cardiac activity  Anxiety in pregnancy Abdominal pain in  pregnancy  P:  Discharge home in stable condition RX Vistaril  Start prenatal care ASAP Return to MAU if symptoms worsen [facility] any thoughts of harm to herself or anyone else.    Duane LopeJennifer I Rasch, NP 11/30/2014 7:33 PM

## 2014-11-30 NOTE — Discharge Instructions (Signed)

## 2014-11-30 NOTE — MAU Note (Signed)
Had anxiety attack yesterday.  When she woke up, she was having pain in lower abd, just to  Rt of midline. Had some spotting last night, none this morning.  Has been having vomiting, assumes it is morning sickness.  Doesn't know what is going on with preg,  Has been coming in for a few wks, doesn't know how far along or if an ectopic.

## 2014-11-30 NOTE — MAU Note (Signed)
To be scheduled for f/u US in next few days, pt has not been called.

## 2014-12-24 ENCOUNTER — Inpatient Hospital Stay (HOSPITAL_COMMUNITY)
Admission: AD | Admit: 2014-12-24 | Discharge: 2014-12-24 | Disposition: A | Discharge status: Home / Self Care | Payer: Medicaid Other | Source: Ambulatory Visit | Attending: Obstetrics & Gynecology | Admitting: Obstetrics & Gynecology

## 2014-12-24 ENCOUNTER — Encounter (HOSPITAL_COMMUNITY): Payer: Self-pay | Admitting: *Deleted

## 2014-12-24 DIAGNOSIS — O9989 Other specified diseases and conditions complicating pregnancy, childbirth and the puerperium: Secondary | ICD-10-CM | POA: Insufficient documentation

## 2014-12-24 DIAGNOSIS — Z91040 Latex allergy status: Secondary | ICD-10-CM | POA: Diagnosis not present

## 2014-12-24 DIAGNOSIS — R109 Unspecified abdominal pain: Secondary | ICD-10-CM

## 2014-12-24 DIAGNOSIS — R112 Nausea with vomiting, unspecified: Secondary | ICD-10-CM | POA: Diagnosis not present

## 2014-12-24 DIAGNOSIS — O219 Vomiting of pregnancy, unspecified: Secondary | ICD-10-CM

## 2014-12-24 DIAGNOSIS — Z87891 Personal history of nicotine dependence: Secondary | ICD-10-CM | POA: Diagnosis not present

## 2014-12-24 DIAGNOSIS — O26899 Other specified pregnancy related conditions, unspecified trimester: Secondary | ICD-10-CM

## 2014-12-24 DIAGNOSIS — Z3A00 Weeks of gestation of pregnancy not specified: Secondary | ICD-10-CM | POA: Diagnosis not present

## 2014-12-24 DIAGNOSIS — Z3A10 10 weeks gestation of pregnancy: Secondary | ICD-10-CM

## 2014-12-24 LAB — URINALYSIS, ROUTINE W REFLEX MICROSCOPIC
Bilirubin Urine: NEGATIVE
GLUCOSE, UA: NEGATIVE mg/dL
Hgb urine dipstick: NEGATIVE
KETONES UR: NEGATIVE mg/dL
LEUKOCYTES UA: NEGATIVE
NITRITE: NEGATIVE
Protein, ur: NEGATIVE mg/dL
Specific Gravity, Urine: 1.02 (ref 1.005–1.030)
Urobilinogen, UA: 0.2 mg/dL (ref 0.0–1.0)
pH: 7 (ref 5.0–8.0)

## 2014-12-24 MED ORDER — LACTATED RINGERS IV BOLUS (SEPSIS)
1000.0000 mL | Freq: Once | INTRAVENOUS | Status: AC
  Administered 2014-12-24: 1000 mL via INTRAVENOUS

## 2014-12-24 MED ORDER — PROMETHAZINE HCL 12.5 MG PO TABS: 12.5000 mg | ORAL_TABLET | Freq: Four times a day (QID) | ORAL | Status: DC | PRN

## 2014-12-24 MED ORDER — PROMETHAZINE HCL 25 MG/ML IJ SOLN
25.0000 mg | Freq: Once | INTRAMUSCULAR | Status: AC
  Administered 2014-12-24: 25 mg via INTRAVENOUS
  Filled 2014-12-24: qty 1

## 2014-12-24 MED ORDER — ACETAMINOPHEN 500 MG PO TABS
1000.0000 mg | ORAL_TABLET | Freq: Once | ORAL | Status: AC
Start: 2014-12-24 — End: 2014-12-24
  Administered 2014-12-24: 1000 mg via ORAL
  Filled 2014-12-24: qty 2

## 2014-12-24 NOTE — MAU Provider Note (Signed)
History     CSN: [id number]  Arrival date and time: 12/24/14 [id number]   First Provider Initiated Contact with Patient 12/24/14 1049      Chief Complaint  Patient presents with  . Abdominal Pain  . Vaginal Bleeding   HPI   [name] is a 29 y.o. female [phone] who presents with Nausea/ Vomiting and lower abdominal cramping that has been going on for 1 week. She is vomiting 3-4 times per day.  She has not taken anything for the nausea or the pain. She currently rates her abdominal cramping 4/10; the cramping comes and goes. Sitting still makes the pain better, moving makes the pain worse, especially when she stands up.   She currently denies vaginal bleeding; she has had on and off spotting throughout the pregnancy.   OB History    Gravida Para Term Preterm AB TAB SAB Ectopic Multiple Living   8 3  3 4 1 3   3       Past Medical History  Diagnosis Date  . Genital warts   . Anxiety 11/21/2011  . Anemia   . Complication of anesthesia     Swelling from some drug used for anesthesia. Does not  know name.  . Genital warts   . HPV test positive   . RhD negative     Past Surgical History  Procedure Laterality Date  . Fractured finger  2001    open reduction  . Therapeutic abortion    . Cesarean section  2007, 2009, 2010    x3    Family History  Problem Relation Age of Onset  . Heart disease Father   . Hyperlipidemia Father     History  Substance Use Topics  . Smoking status: Former Smoker -- 1.00 packs/day for 8 years    Types: Cigarettes    Quit date: 02/21/2014  . Smokeless tobacco: Never Used  . Alcohol Use: No     Comment: socially    Allergies:  Allergies  Allergen Reactions  . Latex Swelling and Other (See Comments)    redness    Prescriptions prior to admission  Medication Sig Dispense Refill Last Dose  . hydrOXYzine (VISTARIL) 25 MG capsule Take 1 capsule (25 mg total) by mouth 3 (three) times daily as needed. 30 capsule 1 12/23/2014 at  Unknown time  . Prenatal Vit-Min-FA-Fish Oil ([employer] PRENATAL GUMMY) 0.4-113.5 MG CHEW Chew 2 each by mouth daily.   Past Week at Unknown time   Results for orders placed or performed during the hospital encounter of 12/24/14 (from the past 48 hour(s))  Urinalysis, Routine w reflex microscopic     Status: Abnormal   Collection Time: 12/24/14 10:58 AM  Result Value Ref Range   Color, Urine YELLOW YELLOW   APPearance CLOUDY (A) CLEAR   Specific Gravity, Urine 1.020 1.005 - 1.030   pH 7.0 5.0 - 8.0   Glucose, UA NEGATIVE NEGATIVE mg/dL   Hgb urine dipstick NEGATIVE NEGATIVE   Bilirubin Urine NEGATIVE NEGATIVE   Ketones, ur NEGATIVE NEGATIVE mg/dL   Protein, ur NEGATIVE NEGATIVE mg/dL   Urobilinogen, UA 0.2 0.0 - 1.0 mg/dL   Nitrite NEGATIVE NEGATIVE   Leukocytes, UA NEGATIVE NEGATIVE    Comment: MICROSCOPIC NOT DONE ON URINES WITH NEGATIVE PROTEIN, BLOOD, LEUKOCYTES, NITRITE, OR GLUCOSE <1000 mg/dL.    Review of Systems  Constitutional: Negative for fever and chills.  Gastrointestinal: Positive for nausea, vomiting and diarrhea.  Genitourinary: Negative for dysuria.   Physical Exam  Blood pressure 99/69, pulse 77, temperature 98.2 F (36.8 C), temperature source Oral, resp. rate 16, height  (1.448 m), weight 52.334 kg (115 lb 6 oz), last menstrual period 10/16/2014.  Physical Exam  Constitutional: She is oriented to person, place, and time. She appears well-developed and well-nourished. No distress.  HENT:  Head: Normocephalic.  Eyes: Pupils are equal, round, and reactive to light.  Neck: Neck supple.  Respiratory: Effort normal.  GI: Soft. She exhibits no distension.  Musculoskeletal: Normal range of motion.  Neurological: She is alert and oriented to person, place, and time.  Skin: Skin is warm. She is not diaphoretic.  Psychiatric: Her behavior is normal.    MAU Course  Procedures  None  MDM LR bolus with 25 mg of phenergan  Tylenol 1 gram  Patient feeling much  better following IVF and phenergan. Currently rates her pain 0/10  Assessment and Plan   A:  1. Abdominal pain in pregnancy   2. Nausea and vomiting during pregnancy     P:  Discharge home in stable condition RX: Phenergan Return to MAU if symptoms worsen  Small,frequent meals Pelvic rest if bleeding Bleeding precautions.    Duane Lope, NP 12/24/2014 3:52 PM

## 2014-12-24 NOTE — MAU Note (Signed)
Pt states here for lower abd cramping like the start of a menstrual cycle, as well as spotting noted when wiping yesterday only.

## 2014-12-24 NOTE — MAU Note (Signed)
Pt. Started to have period like cramps about 2 days ago. Today the cramps are more severe. Also, noted to have brown bleeding yesterday. Also, started to have pressure. Noticed swelling in her hands and feet today. Pt. Started to have nausea, vomiting and diarrhea Thursday. No prenatal care scheduled yet.

## 2014-12-24 NOTE — MAU Note (Signed)
Pt. States she is feeling a lot better. Denies pain.

## 2014-12-24 NOTE — Discharge Instructions (Signed)

## 2015-02-02 ENCOUNTER — Inpatient Hospital Stay (HOSPITAL_COMMUNITY)
Admission: AD | Admit: 2015-02-02 | Discharge: 2015-02-02 | Disposition: A | Discharge status: Home / Self Care | Payer: Medicaid Other | Source: Ambulatory Visit | Attending: Obstetrics & Gynecology | Admitting: Obstetrics & Gynecology

## 2015-02-02 ENCOUNTER — Encounter (HOSPITAL_COMMUNITY): Payer: Self-pay | Admitting: *Deleted

## 2015-02-02 ENCOUNTER — Inpatient Hospital Stay (HOSPITAL_COMMUNITY): Payer: Medicaid Other

## 2015-02-02 DIAGNOSIS — O9989 Other specified diseases and conditions complicating pregnancy, childbirth and the puerperium: Secondary | ICD-10-CM | POA: Diagnosis not present

## 2015-02-02 DIAGNOSIS — Z87891 Personal history of nicotine dependence: Secondary | ICD-10-CM | POA: Insufficient documentation

## 2015-02-02 DIAGNOSIS — Z3A15 15 weeks gestation of pregnancy: Secondary | ICD-10-CM | POA: Insufficient documentation

## 2015-02-02 DIAGNOSIS — R109 Unspecified abdominal pain: Secondary | ICD-10-CM | POA: Diagnosis not present

## 2015-02-02 DIAGNOSIS — M545 Low back pain: Secondary | ICD-10-CM | POA: Diagnosis present

## 2015-02-02 DIAGNOSIS — O26899 Other specified pregnancy related conditions, unspecified trimester: Secondary | ICD-10-CM

## 2015-02-02 LAB — URINALYSIS, ROUTINE W REFLEX MICROSCOPIC
Bilirubin Urine: NEGATIVE
GLUCOSE, UA: NEGATIVE mg/dL
Hgb urine dipstick: NEGATIVE
KETONES UR: NEGATIVE mg/dL
Leukocytes, UA: NEGATIVE
Nitrite: NEGATIVE
PROTEIN: NEGATIVE mg/dL
Specific Gravity, Urine: <1.005 — ABNORMAL LOW (ref 1.005–1.030)
Urobilinogen, UA: 0.2 mg/dL (ref 0.0–1.0)
pH: 6 (ref 5.0–8.0)

## 2015-02-02 LAB — WET PREP, GENITAL
Clue Cells Wet Prep HPF POC: NONE SEEN
TRICH WET PREP: NONE SEEN
Yeast Wet Prep HPF POC: NONE SEEN

## 2015-02-02 MED ORDER — CYCLOBENZAPRINE HCL 10 MG PO TABS: 10.0000 mg | ORAL_TABLET | Freq: Two times a day (BID) | ORAL | Status: DC | PRN

## 2015-02-02 MED ORDER — CYCLOBENZAPRINE HCL 10 MG PO TABS
10.0000 mg | ORAL_TABLET | Freq: Once | ORAL | Status: AC
  Administered 2015-02-02: 10 mg via ORAL
  Filled 2015-02-02: qty 1

## 2015-02-02 NOTE — MAU Note (Signed)
A lot of pelvic pressure, pain in lower back. Started around 1345, just won't go away.

## 2015-02-02 NOTE — Discharge Instructions (Signed)

## 2015-02-02 NOTE — MAU Provider Note (Signed)
History     CSN: [id number]  Arrival date and time: 02/02/15 1600   None     Chief Complaint  Patient presents with  . Pelvic Pain  . Back Pain   HPI  Pt is 7459w5d pregnant G8P3 SAB1 TAB1 with hx of 3 preterm deliveries presents with sudden onset severe lower back pain radiating around to abdomen with a lot of vaginal pressure.  Pt states pain is worse with movement- vomited x1 otherwise has not been nauseated or vomiting.  Pt denies vaginal spotting or vaginal discharge, constipation, diarrhea or UTI sx.  Pt has been well hydrated. Pt had IC 3 days ago without pain. Pt states pain started at 5:30 am and took Excedrin about 10:30 am Rn note  Registered Nurse Signed  MAU Note 02/02/2015 4:13 PM    Expand All Collapse All   A lot of pelvic pressure, pain in lower back. Started around 1345, just won't go away.       Past Medical History  Diagnosis Date  . Genital warts   . Anxiety 11/21/2011  . Anemia   . Complication of anesthesia     Swelling from some drug used for anesthesia. Does not  know name.  . Genital warts   . HPV test positive   . RhD negative     Past Surgical History  Procedure Laterality Date  . Fractured finger  2001    open reduction  . Therapeutic abortion    . Cesarean section  2007, 2009, 2010    x3    Family History  Problem Relation Age of Onset  . Heart disease Father   . Hyperlipidemia Father     History  Substance Use Topics  . Smoking status: Former Smoker -- 1.00 packs/day for 8 years    Types: Cigarettes    Quit date: 02/21/2014  . Smokeless tobacco: Never Used  . Alcohol Use: No     Comment: socially    Allergies:  Allergies  Allergen Reactions  . Latex Swelling and Other (See Comments)    redness    Prescriptions prior to admission  Medication Sig Dispense Refill Last Dose  . hydrOXYzine (VISTARIL) 25 MG capsule Take 1 capsule (25 mg total) by mouth 3 (three) times daily as needed. 30 capsule 1 Past Week at Unknown time   . Prenatal Vit-Fe Fumarate-FA (PRENATAL MULTIVITAMIN) TABS tablet Take 1 tablet by mouth daily.   02/01/2015 at Unknown time  . promethazine (PHENERGAN) 12.5 MG tablet Take 1 tablet (12.5 mg total) by mouth every 6 (six) hours as needed for nausea or vomiting. (Patient not taking: Reported on 02/02/2015) 30 tablet 0 Not Taking at Unknown time    Review of Systems  Constitutional: Negative for fever and chills.  Gastrointestinal: Positive for abdominal pain. Negative for nausea, vomiting, diarrhea and constipation.  Genitourinary: Negative for dysuria and urgency.  Musculoskeletal: Positive for back pain.   Physical Exam   Blood pressure 103/60, pulse 108, temperature 98.4 F (36.9 C), temperature source Oral, resp. rate 20, height 4' 9.25" (1.454 m), weight 124 lb (56.246 kg), last menstrual period 10/16/2014.  Physical Exam  Nursing note and vitals reviewed. Constitutional: She is oriented to person, place, and time. She appears well-developed and well-nourished. No distress.  HENT:  Head: Normocephalic.  Eyes: Pupils are equal, round, and reactive to light.  Neck: Normal range of motion.  Cardiovascular: Normal rate.   Respiratory: Effort normal.  GI: Soft. She exhibits no distension. There is tenderness.  There is no rebound and no guarding.  FHT 161 bpm with doppler  Genitourinary:  Small amount of white creamy discharge in vault; cervix closed and long, NT; uterus gravid  Musculoskeletal: Normal range of motion.  Neurological: She is alert and oriented to person, place, and time.  Skin: Skin is warm.  Psychiatric: She has a normal mood and affect.    MAU Course  Procedures Results for orders placed or performed during the hospital encounter of 02/02/15 (from the past 24 hour(s))  Urinalysis, Routine w reflex microscopic (not at [facility])     Status: Abnormal   Collection Time: 02/02/15  4:25 PM  Result Value Ref Range   Color, Urine YELLOW YELLOW   APPearance CLEAR CLEAR    Specific Gravity, Urine <1.005 (L) 1.005 - 1.030   pH 6.0 5.0 - 8.0   Glucose, UA NEGATIVE NEGATIVE mg/dL   Hgb urine dipstick NEGATIVE NEGATIVE   Bilirubin Urine NEGATIVE NEGATIVE   Ketones, ur NEGATIVE NEGATIVE mg/dL   Protein, ur NEGATIVE NEGATIVE mg/dL   Urobilinogen, UA 0.2 0.0 - 1.0 mg/dL   Nitrite NEGATIVE NEGATIVE   Leukocytes, UA NEGATIVE NEGATIVE  Wet prep, genital     Status: Abnormal   Collection Time: 02/02/15  5:25 PM  Result Value Ref Range   Yeast Wet Prep HPF POC NONE SEEN NONE SEEN   Trich, Wet Prep NONE SEEN NONE SEEN   Clue Cells Wet Prep HPF POC NONE SEEN NONE SEEN   WBC, Wet Prep HPF POC FEW (A) NONE SEEN   Preliminary US WNL- cervix 3.5 cm; placenta above cervical os; breech presentation Flexeril  PO give with 100% relief of pain Pt has not had prenatal care- waiting for insurance- will send note to [facility] clinic for pt to be seen  Assessment and Plan  Abdominal pain and back pain in pregnancy Recommend maternity belt Back exercises Rx Flexeril Late to prenatal care- note sent to clinic to make pt appointment  Nijee Heatwole 02/02/2015, 5:06 PM

## 2015-02-03 DIAGNOSIS — Z3A15 15 weeks gestation of pregnancy: Secondary | ICD-10-CM | POA: Insufficient documentation

## 2015-02-23 ENCOUNTER — Encounter: Payer: Self-pay | Admitting: Family

## 2015-02-23 ENCOUNTER — Ambulatory Visit (INDEPENDENT_AMBULATORY_CARE_PROVIDER_SITE_OTHER): Payer: Self-pay | Admitting: Family

## 2015-02-23 VITALS — BP 90/50 | HR 82 | Wt 127.6 lb

## 2015-02-23 DIAGNOSIS — O360122 Maternal care for anti-D [Rh] antibodies, second trimester, fetus 2: Secondary | ICD-10-CM

## 2015-02-23 DIAGNOSIS — Z113 Encounter for screening for infections with a predominantly sexual mode of transmission: Secondary | ICD-10-CM

## 2015-02-23 DIAGNOSIS — O09899 Supervision of other high risk pregnancies, unspecified trimester: Secondary | ICD-10-CM | POA: Insufficient documentation

## 2015-02-23 DIAGNOSIS — O09892 Supervision of other high risk pregnancies, second trimester: Secondary | ICD-10-CM

## 2015-02-23 DIAGNOSIS — O09219 Supervision of pregnancy with history of pre-term labor, unspecified trimester: Secondary | ICD-10-CM

## 2015-02-23 DIAGNOSIS — Z6791 Unspecified blood type, Rh negative: Secondary | ICD-10-CM | POA: Insufficient documentation

## 2015-02-23 DIAGNOSIS — O360121 Maternal care for anti-D [Rh] antibodies, second trimester, fetus 1: Secondary | ICD-10-CM

## 2015-02-23 DIAGNOSIS — O34219 Maternal care for unspecified type scar from previous cesarean delivery: Secondary | ICD-10-CM

## 2015-02-23 DIAGNOSIS — Z1151 Encounter for screening for human papillomavirus (HPV): Secondary | ICD-10-CM

## 2015-02-23 DIAGNOSIS — O0992 Supervision of high risk pregnancy, unspecified, second trimester: Secondary | ICD-10-CM

## 2015-02-23 DIAGNOSIS — O3421 Maternal care for scar from previous cesarean delivery: Secondary | ICD-10-CM

## 2015-02-23 DIAGNOSIS — Z118 Encounter for screening for other infectious and parasitic diseases: Secondary | ICD-10-CM

## 2015-02-23 DIAGNOSIS — O0990 Supervision of high risk pregnancy, unspecified, unspecified trimester: Secondary | ICD-10-CM | POA: Insufficient documentation

## 2015-02-23 DIAGNOSIS — Z124 Encounter for screening for malignant neoplasm of cervix: Secondary | ICD-10-CM

## 2015-02-23 DIAGNOSIS — O09212 Supervision of pregnancy with history of pre-term labor, second trimester: Secondary | ICD-10-CM

## 2015-02-23 DIAGNOSIS — O26899 Other specified pregnancy related conditions, unspecified trimester: Secondary | ICD-10-CM | POA: Insufficient documentation

## 2015-02-23 LAB — POCT URINALYSIS DIP (DEVICE)
Bilirubin Urine: NEGATIVE
Glucose, UA: NEGATIVE mg/dL
Hgb urine dipstick: NEGATIVE
KETONES UR: NEGATIVE mg/dL
Leukocytes, UA: NEGATIVE
Nitrite: NEGATIVE
Protein, ur: NEGATIVE mg/dL
Specific Gravity, Urine: 1.02 (ref 1.005–1.030)
UROBILINOGEN UA: 0.2 mg/dL (ref 0.0–1.0)
pH: 7 (ref 5.0–8.0)

## 2015-02-23 LAB — OB RESULTS CONSOLE GBS: STREP GROUP B AG: POSITIVE

## 2015-02-23 MED ORDER — IMIQUIMOD 5 % EX CREA: TOPICAL_CREAM | CUTANEOUS | Status: DC

## 2015-02-23 NOTE — Patient Instructions (Signed)
Second Trimester of Pregnancy The second trimester is from week 13 through week 28, months 4 through 6. The second trimester is often a time when you feel your best. Your body has also adjusted to being pregnant, and you begin to feel better physically. Usually, morning sickness has lessened or quit completely, you may have more energy, and you may have an increase in appetite. The second trimester is also a time when the fetus is growing rapidly. At the end of the sixth month, the fetus is about 9 inches long and weighs about 1 pounds. You will likely begin to feel the baby move (quickening) between 18 and 20 weeks of the pregnancy. BODY CHANGES Your body goes through many changes during pregnancy. The changes vary from woman to woman.   Your weight will continue to increase. You will notice your lower abdomen bulging out.  You may begin to get stretch marks on your hips, abdomen, and breasts.  You may develop headaches that can be relieved by medicines approved by your health care provider.  You may urinate more often because the fetus is pressing on your bladder.  You may develop or continue to have heartburn as a result of your pregnancy.  You may develop constipation because certain hormones are causing the muscles that push waste through your intestines to slow down.  You may develop hemorrhoids or swollen, bulging veins (varicose veins).  You may have back pain because of the weight gain and pregnancy hormones relaxing your joints between the bones in your pelvis and as a result of a shift in weight and the muscles that support your balance.  Your breasts will continue to grow and be tender.  Your gums may bleed and may be sensitive to brushing and flossing.  Dark spots or blotches (chloasma, mask of pregnancy) may develop on your face. This will likely fade after the baby is born.  A dark line from your belly button to the pubic area (linea nigra) may appear. This will likely fade  after the baby is born.  You may have changes in your hair. These can include thickening of your hair, rapid growth, and changes in texture. Some women also have hair loss during or after pregnancy, or hair that feels dry or thin. Your hair will most likely return to normal after your baby is born. WHAT TO EXPECT AT YOUR PRENATAL VISITS During a routine prenatal visit:  You will be weighed to make sure you and the fetus are growing normally.  Your blood pressure will be taken.  Your abdomen will be measured to track your baby's growth.  The fetal heartbeat will be listened to.  Any test results from the previous visit will be discussed. Your health care provider may ask you:  How you are feeling.  If you are feeling the baby move.  If you have had any abnormal symptoms, such as leaking fluid, bleeding, severe headaches, or abdominal cramping.  If you have any questions. Other tests that may be performed during your second trimester include:  Blood tests that check for:  Low iron levels (anemia).  Gestational diabetes (between 24 and 28 weeks).  Rh antibodies.  Urine tests to check for infections, diabetes, or protein in the urine.  An ultrasound to confirm the proper growth and development of the baby.  An amniocentesis to check for possible genetic problems.  Fetal screens for spina bifida and Down syndrome. HOME CARE INSTRUCTIONS   Avoid all smoking, herbs, alcohol, and unprescribed   drugs. These chemicals affect the formation and growth of the baby.  Follow your health care provider's instructions regarding medicine use. There are medicines that are either safe or unsafe to take during pregnancy.  Exercise only as directed by your health care provider. Experiencing uterine cramps is a good sign to stop exercising.  Continue to eat regular, healthy meals.  Wear a good support bra for breast tenderness.  Do not use hot tubs, steam rooms, or saunas.  Wear your  seat belt at all times when driving.  Avoid raw meat, uncooked cheese, cat litter boxes, and soil used by cats. These carry germs that can cause birth defects in the baby.  Take your prenatal vitamins.  Try taking a stool softener (if your health care provider approves) if you develop constipation. Eat more high-fiber foods, such as fresh vegetables or fruit and whole grains. Drink plenty of fluids to keep your urine clear or pale yellow.  Take warm sitz baths to soothe any pain or discomfort caused by hemorrhoids. Use hemorrhoid cream if your health care provider approves.  If you develop varicose veins, wear support hose. Elevate your feet for 15 minutes, 3-4 times a day. Limit salt in your diet.  Avoid heavy lifting, wear low heel shoes, and practice good posture.  Rest with your legs elevated if you have leg cramps or low back pain.  Visit your dentist if you have not gone yet during your pregnancy. Use a soft toothbrush to brush your teeth and be gentle when you floss.  A sexual relationship may be continued unless your health care provider directs you otherwise.  Continue to go to all your prenatal visits as directed by your health care provider. SEEK MEDICAL CARE IF:   You have dizziness.  You have mild pelvic cramps, pelvic pressure, or nagging pain in the abdominal area.  You have persistent nausea, vomiting, or diarrhea.  You have a bad smelling vaginal discharge.  You have pain with urination. SEEK IMMEDIATE MEDICAL CARE IF:   You have a fever.  You are leaking fluid from your vagina.  You have spotting or bleeding from your vagina.  You have severe abdominal cramping or pain.  You have rapid weight gain or loss.  You have shortness of breath with chest pain.  You notice sudden or extreme swelling of your face, hands, ankles, feet, or legs.  You have not felt your baby move in over an hour.  You have severe headaches that do not go away with  medicine.  You have vision changes. Document Released: 07/16/2001 Document Revised: 07/27/2013 Document Reviewed: 09/22/2012 Exit[employer] Patient Information 2015 [employer]. This information is not intended to replace advice given to you by your health care provider. Make sure you discuss any questions you have with your health care provider.  

## 2015-02-23 NOTE — Progress Notes (Signed)
Ultrasound scheduled for 03/06/2015@ 12:00PM

## 2015-02-23 NOTE — Progress Notes (Signed)
Pt reports not feeling well. She reports having lower extremity swelling, and pelvic pain and cramping. Pt also reports very bad headaches.

## 2015-02-23 NOTE — Progress Notes (Signed)
Subjective:    [name] is a [id number] [redacted]w[redacted]d being seen today for her first obstetrical visit.  Her obstetrical history is significant for prior csection x 3 and preterm delivery x 3.  . Patient uncertain about breast feed. Pregnancy history fully reviewed.  Patient reports pelvic pressure, cramping and feet swelling.  Also reports increased genital warts during pregnancy.  Filed Vitals:   02/23/15 0807  BP: 90/50  Pulse: 82  Weight: 127 lb 9.6 oz (57.879 kg)    HISTORY: OB History  Gravida Para Term Preterm AB SAB TAB Ectopic Multiple Living  8 3  3 4 3 1   6     # Outcome Date GA Lbr Len/2nd Weight Sex Delivery Anes PTL Lv  8 Current           7 Preterm 05/06/09 [redacted]w[redacted]d  6 lb 7 oz (2.92 kg) M [facility] Y  6 Preterm 08/24/07 [redacted]w[redacted]d  6 lb 12 oz (3.062 kg) F CS-LTranv  Y Y  5 Preterm 01/18/06 [redacted]w[redacted]d  6 lb 9 oz (2.977 kg) F CS-LTranv EPI Y Y     Complications: Fetal Intolerance  4 SAB           3 SAB           2 SAB           1 TAB              Past Medical History  Diagnosis Date  . Genital warts   . Anxiety 11/21/2011  . Anemia   . Complication of anesthesia     Swelling from some drug used for anesthesia. Does not  know name.  . Genital warts   . HPV test positive   . RhD negative    Past Surgical History  Procedure Laterality Date  . Fractured finger  2001    open reduction  . Therapeutic abortion    . Cesarean section  2007, 2009, 2010    x3   Family History  Problem Relation Age of Onset  . Heart disease Father   . Hyperlipidemia Father      Exam    BP 90/50 mmHg  Pulse 82  Wt 127 lb 9.6 oz (57.879 kg)  LMP 10/16/2014 (Within Days) Uterine Size: size equals dates  Pelvic Exam:    Perineum: No Hemorrhoids, Normal Perineum   Vulva: Multiple large genital warts at introitis   Vagina:  normal mucosa, normal discharge, no palpable nodules; multiple warts at introitis   pH: Not done   Cervix: no bleeding following Pap, no cervical motion  tenderness and no lesions   Adnexa: normal adnexa and no mass, fullness, tenderness   Bony Pelvis: Adequate  System: Breast:  No nipple retraction or dimpling, No nipple discharge or bleeding, No axillary or supraclavicular adenopathy, Normal to palpation without dominant masses   Skin: normal coloration and turgor, no rashes    Neurologic: negative   Extremities: normal strength, tone, and muscle mass   HEENT neck supple with midline trachea and thyroid without masses   Mouth/Teeth mucous membranes moist, pharynx normal without lesions   Neck supple and no masses   Cardiovascular: regular rate and rhythm, no murmurs or gallops   Respiratory:  appears well, vitals normal, no respiratory distress, acyanotic, normal RR, neck free of mass or lymphadenopathy, chest clear, no wheezing, crepitations, rhonchi, normal symmetric air entry   Abdomen: soft, non-tender; bowel sounds normal; no masses,  no organomegaly  Urinary: urethral meatus normal      Assessment:    Pregnancy: 8  29 y.o. [id number] at [redacted]w[redacted]d wks IUP Patient Active Problem List   Diagnosis Date Noted  . Supervision of high risk pregnancy, antepartum 02/23/2015  . Previous cesarean delivery affecting pregnancy, antepartum 02/23/2015  . History of preterm delivery, currently pregnant 02/23/2015  . Anxiety 11/21/2011  . Genital warts 08/02/2011  . TOBACCO USER 04/22/2009  . ABNORMAL VAGINAL BLEEDING 11/02/2007  . DISORDER, DEPRESSIVE NEC 05/27/2007  . PREMATURE LABOR 05/27/2007  . CESAREAN, PREVIOUS, DELIVERED 05/27/2007        Plan:     Initial labs drawn. Prenatal vitamins. Problem list reviewed and updated. RX [facility]  Genetic Screening discussed Quad Screen: ordered.  Ultrasound discussed; fetal survey: ordered.  Follow up in 3 weeks or sooner pending 17 p availability; application completed today.    Marlis Edelson 02/23/2015

## 2015-02-24 LAB — PRENATAL PROFILE (SOLSTAS)
ANTIBODY SCREEN: NEGATIVE
Basophils Absolute: 0 10*3/uL (ref 0.0–0.1)
Basophils Relative: 0 % (ref 0–1)
EOS ABS: 0.1 10*3/uL (ref 0.0–0.7)
Eosinophils Relative: 1 % (ref 0–5)
HEMATOCRIT: 34.7 % — AB (ref 36.0–46.0)
HIV 1&2 Ab, 4th Generation: NONREACTIVE
Hemoglobin: 11.5 g/dL — ABNORMAL LOW (ref 12.0–15.0)
Hepatitis B Surface Ag: NEGATIVE
LYMPHS PCT: 15 % (ref 12–46)
Lymphs Abs: 1.9 10*3/uL (ref 0.7–4.0)
MCH: 29.6 pg (ref 26.0–34.0)
MCHC: 33.1 g/dL (ref 30.0–36.0)
MCV: 89.2 fL (ref 78.0–100.0)
MPV: 9.2 fL (ref 8.6–12.4)
Monocytes Absolute: 0.9 10*3/uL (ref 0.1–1.0)
Monocytes Relative: 7 % (ref 3–12)
NEUTROS ABS: 9.9 10*3/uL — AB (ref 1.7–7.7)
NEUTROS PCT: 77 % (ref 43–77)
PLATELETS: 293 10*3/uL (ref 150–400)
RBC: 3.89 MIL/uL (ref 3.87–5.11)
RDW: 14 % (ref 11.5–15.5)
RH TYPE: NEGATIVE
Rubella: 2.63 Index — ABNORMAL HIGH (ref <0.90)
WBC: 12.9 10*3/uL — ABNORMAL HIGH (ref 4.0–10.5)

## 2015-02-24 LAB — CYTOLOGY - PAP

## 2015-02-25 LAB — CULTURE, OB URINE: Colony Count: >=100000

## 2015-02-27 ENCOUNTER — Encounter: Payer: Self-pay | Admitting: Family

## 2015-02-27 ENCOUNTER — Other Ambulatory Visit: Payer: Self-pay | Admitting: Family

## 2015-02-27 DIAGNOSIS — B951 Streptococcus, group B, as the cause of diseases classified elsewhere: Secondary | ICD-10-CM | POA: Insufficient documentation

## 2015-02-27 DIAGNOSIS — O2340 Unspecified infection of urinary tract in pregnancy, unspecified trimester: Secondary | ICD-10-CM

## 2015-02-27 LAB — CANNABANOIDS (GC/LC/MS), URINE: THC-COOH UR CONFIRM: 450 ng/mL — AB (ref <5)

## 2015-02-27 MED ORDER — AMOXICILLIN 500 MG PO CAPS: 500.0000 mg | ORAL_CAPSULE | Freq: Three times a day (TID) | ORAL | Status: DC

## 2015-02-28 ENCOUNTER — Telehealth: Payer: Self-pay | Admitting: Family

## 2015-02-28 LAB — PRESCRIPTION MONITORING PROFILE (19 PANEL)
AMPHETAMINE/METH: NEGATIVE ng/mL
BARBITURATE SCREEN, URINE: NEGATIVE ng/mL
BENZODIAZEPINE SCREEN, URINE: NEGATIVE ng/mL
Buprenorphine, Urine: NEGATIVE ng/mL
Carisoprodol, Urine: NEGATIVE ng/mL
Cocaine Metabolites: NEGATIVE ng/mL
Creatinine, Urine: 74.44 mg/dL (ref >20.0)
Fentanyl, Ur: NEGATIVE ng/mL
MDMA URINE: NEGATIVE ng/mL
Meperidine, Ur: NEGATIVE ng/mL
Methadone Screen, Urine: NEGATIVE ng/mL
Methaqualone: NEGATIVE ng/mL
NITRITES URINE, INITIAL: NEGATIVE ug/mL
OPIATE SCREEN, URINE: NEGATIVE ng/mL
Oxycodone Screen, Ur: NEGATIVE ng/mL
PHENCYCLIDINE, UR: NEGATIVE ng/mL
Propoxyphene: NEGATIVE ng/mL
Tapentadol, urine: NEGATIVE ng/mL
Tramadol Scrn, Ur: NEGATIVE ng/mL
Zolpidem, Urine: NEGATIVE ng/mL
pH, Initial: 7 pH (ref 4.5–8.9)

## 2015-02-28 LAB — AFP, QUAD SCREEN
AFP: 40.4 ng/mL
CURR GEST AGE: 18.4 wks.days
HCG, Total: 32.42 IU/mL
INH: 308.7 pg/mL
Interpretation-AFP: NEGATIVE
MOM FOR HCG: 1.17
MOM FOR INH: 1.55
MoM for AFP: 0.78
Open Spina bifida: NEGATIVE
Osb Risk: <1:27300 {titer}
Tri 18 Scr Risk Est: NEGATIVE
uE3 Mom: 1.34
uE3 Value: 1.9 ng/mL

## 2015-02-28 NOTE — Telephone Encounter (Signed)
Pt called to inform regarding GBS UTI in pregnancy and that RX has been sent to pharmacy.  Left message on voicemail to call office.

## 2015-03-01 NOTE — Telephone Encounter (Signed)
Pt returned call and notified regarding UTI.

## 2015-03-03 ENCOUNTER — Inpatient Hospital Stay (HOSPITAL_COMMUNITY)
Admission: AD | Admit: 2015-03-03 | Discharge: 2015-03-03 | Disposition: A | Discharge status: Home / Self Care | Payer: Medicaid Other | Source: Ambulatory Visit | Attending: Obstetrics & Gynecology | Admitting: Obstetrics & Gynecology

## 2015-03-03 ENCOUNTER — Encounter (HOSPITAL_COMMUNITY): Payer: Self-pay | Admitting: *Deleted

## 2015-03-03 DIAGNOSIS — Z3A21 21 weeks gestation of pregnancy: Secondary | ICD-10-CM | POA: Diagnosis not present

## 2015-03-03 DIAGNOSIS — O99820 Streptococcus B carrier state complicating pregnancy: Secondary | ICD-10-CM | POA: Diagnosis not present

## 2015-03-03 DIAGNOSIS — O2342 Unspecified infection of urinary tract in pregnancy, second trimester: Secondary | ICD-10-CM | POA: Insufficient documentation

## 2015-03-03 DIAGNOSIS — F17210 Nicotine dependence, cigarettes, uncomplicated: Secondary | ICD-10-CM | POA: Diagnosis not present

## 2015-03-03 DIAGNOSIS — R109 Unspecified abdominal pain: Secondary | ICD-10-CM | POA: Insufficient documentation

## 2015-03-03 DIAGNOSIS — O9989 Other specified diseases and conditions complicating pregnancy, childbirth and the puerperium: Secondary | ICD-10-CM | POA: Diagnosis not present

## 2015-03-03 DIAGNOSIS — O9A312 Physical abuse complicating pregnancy, second trimester: Secondary | ICD-10-CM

## 2015-03-03 DIAGNOSIS — W228XXA Striking against or struck by other objects, initial encounter: Secondary | ICD-10-CM | POA: Diagnosis not present

## 2015-03-03 DIAGNOSIS — O26899 Other specified pregnancy related conditions, unspecified trimester: Secondary | ICD-10-CM

## 2015-03-03 DIAGNOSIS — O99332 Smoking (tobacco) complicating pregnancy, second trimester: Secondary | ICD-10-CM | POA: Insufficient documentation

## 2015-03-03 DIAGNOSIS — IMO0002 Reserved for concepts with insufficient information to code with codable children: Secondary | ICD-10-CM

## 2015-03-03 HISTORY — DX: Depression, unspecified: F32.A

## 2015-03-03 HISTORY — DX: Major depressive disorder, single episode, unspecified: F32.9

## 2015-03-03 MED ORDER — CYCLOBENZAPRINE HCL 10 MG PO TABS: 10.0000 mg | ORAL_TABLET | Freq: Once | ORAL | Status: DC

## 2015-03-03 MED ORDER — OXYCODONE-ACETAMINOPHEN 5-325 MG PO TABS: 2.0000 | ORAL_TABLET | Freq: Once | ORAL | Status: DC
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
## 2015-03-03 NOTE — Discharge Instructions (Signed)

## 2015-03-06 ENCOUNTER — Encounter (HOSPITAL_COMMUNITY): Payer: Self-pay

## 2015-03-06 ENCOUNTER — Ambulatory Visit (HOSPITAL_COMMUNITY)
Admission: RE | Admit: 2015-03-06 | Discharge: 2015-03-06 | Disposition: A | Discharge status: Home / Self Care | Payer: Medicaid Other | Source: Ambulatory Visit | Attending: Family | Admitting: Family

## 2015-03-06 DIAGNOSIS — O09212 Supervision of pregnancy with history of pre-term labor, second trimester: Secondary | ICD-10-CM

## 2015-03-06 DIAGNOSIS — O0992 Supervision of high risk pregnancy, unspecified, second trimester: Secondary | ICD-10-CM

## 2015-03-06 DIAGNOSIS — Z3A20 20 weeks gestation of pregnancy: Secondary | ICD-10-CM | POA: Insufficient documentation

## 2015-03-06 DIAGNOSIS — O09892 Supervision of other high risk pregnancies, second trimester: Secondary | ICD-10-CM | POA: Insufficient documentation

## 2015-03-06 DIAGNOSIS — Z3689 Encounter for other specified antenatal screening: Secondary | ICD-10-CM | POA: Insufficient documentation

## 2015-03-09 ENCOUNTER — Ambulatory Visit (INDEPENDENT_AMBULATORY_CARE_PROVIDER_SITE_OTHER): Payer: Self-pay | Admitting: General Practice

## 2015-03-09 DIAGNOSIS — O09212 Supervision of pregnancy with history of pre-term labor, second trimester: Secondary | ICD-10-CM

## 2015-03-09 MED ORDER — HYDROXYPROGESTERONE CAPROATE 250 MG/ML IM OIL
250.0000 mg | TOPICAL_OIL | INTRAMUSCULAR | Status: AC
  Administered 2015-03-09 – 2015-05-04 (×5): 250 mg via INTRAMUSCULAR

## 2015-03-16 ENCOUNTER — Ambulatory Visit (INDEPENDENT_AMBULATORY_CARE_PROVIDER_SITE_OTHER): Payer: Self-pay | Admitting: Family Medicine

## 2015-03-16 ENCOUNTER — Encounter: Payer: Self-pay | Admitting: Family Medicine

## 2015-03-16 VITALS — BP 106/67 | HR 82 | Wt 127.7 lb

## 2015-03-16 DIAGNOSIS — O09892 Supervision of other high risk pregnancies, second trimester: Secondary | ICD-10-CM

## 2015-03-16 DIAGNOSIS — O09212 Supervision of pregnancy with history of pre-term labor, second trimester: Secondary | ICD-10-CM

## 2015-03-16 DIAGNOSIS — O0992 Supervision of high risk pregnancy, unspecified, second trimester: Secondary | ICD-10-CM

## 2015-03-16 DIAGNOSIS — Z113 Encounter for screening for infections with a predominantly sexual mode of transmission: Secondary | ICD-10-CM

## 2015-03-16 LAB — POCT URINALYSIS DIP (DEVICE)
Glucose, UA: NEGATIVE mg/dL
Hgb urine dipstick: NEGATIVE
KETONES UR: 80 mg/dL — AB
LEUKOCYTES UA: NEGATIVE
Nitrite: NEGATIVE
PH: 6 (ref 5.0–8.0)
Protein, ur: 30 mg/dL — AB
Urobilinogen, UA: 1 mg/dL (ref 0.0–1.0)

## 2015-03-16 NOTE — Patient Instructions (Signed)
Second Trimester of Pregnancy The second trimester is from week 13 through week 28, months 4 through 6. The second trimester is often a time when you feel your best. Your body has also adjusted to being pregnant, and you begin to feel better physically. Usually, morning sickness has lessened or quit completely, you may have more energy, and you may have an increase in appetite. The second trimester is also a time when the fetus is growing rapidly. At the end of the sixth month, the fetus is about 9 inches long and weighs about 1 pounds. You will likely begin to feel the baby move (quickening) between 18 and 20 weeks of the pregnancy. BODY CHANGES Your body goes through many changes during pregnancy. The changes vary from woman to woman.   Your weight will continue to increase. You will notice your lower abdomen bulging out.  You may begin to get stretch marks on your hips, abdomen, and breasts.  You may develop headaches that can be relieved by medicines approved by your health care provider.  You may urinate more often because the fetus is pressing on your bladder.  You may develop or continue to have heartburn as a result of your pregnancy.  You may develop constipation because certain hormones are causing the muscles that push waste through your intestines to slow down.  You may develop hemorrhoids or swollen, bulging veins (varicose veins).  You may have back pain because of the weight gain and pregnancy hormones relaxing your joints between the bones in your pelvis and as a result of a shift in weight and the muscles that support your balance.  Your breasts will continue to grow and be tender.  Your gums may bleed and may be sensitive to brushing and flossing.  Dark spots or blotches (chloasma, mask of pregnancy) may develop on your face. This will likely fade after the baby is born.  A dark line from your belly button to the pubic area (linea nigra) may appear. This will likely  fade after the baby is born.  You may have changes in your hair. These can include thickening of your hair, rapid growth, and changes in texture. Some women also have hair loss during or after pregnancy, or hair that feels dry or thin. Your hair will most likely return to normal after your baby is born. WHAT TO EXPECT AT YOUR PRENATAL VISITS During a routine prenatal visit:  You will be weighed to make sure you and the fetus are growing normally.  Your blood pressure will be taken.  Your abdomen will be measured to track your baby's growth.  The fetal heartbeat will be listened to.  Any test results from the previous visit will be discussed. Your health care provider may ask you:  How you are feeling.  If you are feeling the baby move.  If you have had any abnormal symptoms, such as leaking fluid, bleeding, severe headaches, or abdominal cramping.  If you have any questions. Other tests that may be performed during your second trimester include:  Blood tests that check for:  Low iron levels (anemia).  Gestational diabetes (between 24 and 28 weeks).  Rh antibodies.  Urine tests to check for infections, diabetes, or protein in the urine.  An ultrasound to confirm the proper growth and development of the baby.  An amniocentesis to check for possible genetic problems.  Fetal screens for spina bifida and Down syndrome. HOME CARE INSTRUCTIONS   Avoid all smoking, herbs, alcohol, and unprescribed   drugs. These chemicals affect the formation and growth of the baby.  Follow your health care provider's instructions regarding medicine use. There are medicines that are either safe or unsafe to take during pregnancy.  Exercise only as directed by your health care provider. Experiencing uterine cramps is a good sign to stop exercising.  Continue to eat regular, healthy meals.  Wear a good support bra for breast tenderness.  Do not use hot tubs, steam rooms, or saunas.  Wear  your seat belt at all times when driving.  Avoid raw meat, uncooked cheese, cat litter boxes, and soil used by cats. These carry germs that can cause birth defects in the baby.  Take your prenatal vitamins.  Try taking a stool softener (if your health care provider approves) if you develop constipation. Eat more high-fiber foods, such as fresh vegetables or fruit and whole grains. Drink plenty of fluids to keep your urine clear or pale yellow.  Take warm sitz baths to soothe any pain or discomfort caused by hemorrhoids. Use hemorrhoid cream if your health care provider approves.  If you develop varicose veins, wear support hose. Elevate your feet for 15 minutes, 3-4 times a day. Limit salt in your diet.  Avoid heavy lifting, wear low heel shoes, and practice good posture.  Rest with your legs elevated if you have leg cramps or low back pain.  Visit your dentist if you have not gone yet during your pregnancy. Use a soft toothbrush to brush your teeth and be gentle when you floss.  A sexual relationship may be continued unless your health care provider directs you otherwise.  Continue to go to all your prenatal visits as directed by your health care provider. SEEK MEDICAL CARE IF:   You have dizziness.  You have mild pelvic cramps, pelvic pressure, or nagging pain in the abdominal area.  You have persistent nausea, vomiting, or diarrhea.  You have a bad smelling vaginal discharge.  You have pain with urination. SEEK IMMEDIATE MEDICAL CARE IF:   You have a fever.  You are leaking fluid from your vagina.  You have spotting or bleeding from your vagina.  You have severe abdominal cramping or pain.  You have rapid weight gain or loss.  You have shortness of breath with chest pain.  You notice sudden or extreme swelling of your face, hands, ankles, feet, or legs.  You have not felt your baby move in over an hour.  You have severe headaches that do not go away with  medicine.  You have vision changes. Document Released: 07/16/2001 Document Revised: 07/27/2013 Document Reviewed: 09/22/2012 [employer] Patient Information 2015 [employer]. This information is not intended to replace advice given to you by your health care provider. Make sure you discuss any questions you have with your health care provider.  Breastfeeding Deciding to breastfeed is one of the best choices you can make for you and your baby. A change in hormones during pregnancy causes your breast tissue to grow and increases the number and size of your milk ducts. These hormones also allow proteins, sugars, and fats from your blood supply to make breast milk in your milk-producing glands. Hormones prevent breast milk from being released before your baby is born as well as prompt milk flow after birth. Once breastfeeding has begun, thoughts of your baby, as well as his or her sucking or crying, can stimulate the release of milk from your milk-producing glands.  BENEFITS OF BREASTFEEDING For Your Baby  Your first   milk (colostrum) helps your baby's digestive system function better.   There are antibodies in your milk that help your baby fight off infections.   Your baby has a lower incidence of asthma, allergies, and sudden infant death syndrome.   The nutrients in breast milk are better for your baby than infant formulas and are designed uniquely for your baby's needs.   Breast milk improves your baby's brain development.   Your baby is less likely to develop other conditions, such as childhood obesity, asthma, or type 2 diabetes mellitus.  For You   Breastfeeding helps to create a very special bond between you and your baby.   Breastfeeding is convenient. Breast milk is always available at the correct temperature and costs nothing.   Breastfeeding helps to burn calories and helps you lose the weight gained during pregnancy.   Breastfeeding makes your uterus contract to its  prepregnancy size faster and slows bleeding (lochia) after you give birth.   Breastfeeding helps to lower your risk of developing type 2 diabetes mellitus, osteoporosis, and breast or ovarian cancer later in life. SIGNS THAT YOUR BABY IS HUNGRY Early Signs of Hunger  Increased alertness or activity.  Stretching.  Movement of the head from side to side.  Movement of the head and opening of the mouth when the corner of the mouth or cheek is stroked (rooting).  Increased sucking sounds, smacking lips, cooing, sighing, or squeaking.  Hand-to-mouth movements.  Increased sucking of fingers or hands. Late Signs of Hunger  Fussing.  Intermittent crying. Extreme Signs of Hunger Signs of extreme hunger will require calming and consoling before your baby will be able to breastfeed successfully. Do not wait for the following signs of extreme hunger to occur before you initiate breastfeeding:   Restlessness.  A loud, strong cry.   Screaming. BREASTFEEDING BASICS Breastfeeding Initiation  Find a comfortable place to sit or lie down, with your neck and back well supported.  Place a pillow or rolled up blanket under your baby to bring him or her to the level of your breast (if you are seated). Nursing pillows are specially designed to help support your arms and your baby while you breastfeed.  Make sure that your baby's abdomen is facing your abdomen.   Gently massage your breast. With your fingertips, massage from your chest wall toward your nipple in a circular motion. This encourages milk flow. You may need to continue this action during the feeding if your milk flows slowly.  Support your breast with 4 fingers underneath and your thumb above your nipple. Make sure your fingers are well away from your nipple and your baby's mouth.   Stroke your baby's lips gently with your finger or nipple.   When your baby's mouth is open wide enough, quickly bring your baby to your breast,  placing your entire nipple and as much of the colored area around your nipple (areola) as possible into your baby's mouth.   More areola should be visible above your baby's upper lip than below the lower lip.   Your baby's tongue should be between his or her lower gum and your breast.   Ensure that your baby's mouth is correctly positioned around your nipple (latched). Your baby's lips should create a seal on your breast and be turned out (everted).  It is common for your baby to suck about 2-3 minutes in order to start the flow of breast milk. Latching Teaching your baby how to latch on to your breast   properly is very important. An improper latch can cause nipple pain and decreased milk supply for you and poor weight gain in your baby. Also, if your baby is not latched onto your nipple properly, he or she may swallow some air during feeding. This can make your baby fussy. Burping your baby when you switch breasts during the feeding can help to get rid of the air. However, teaching your baby to latch on properly is still the best way to prevent fussiness from swallowing air while breastfeeding. Signs that your baby has successfully latched on to your nipple:    Silent tugging or silent sucking, without causing you pain.   Swallowing heard between every 3-4 sucks.    Muscle movement above and in front of his or her ears while sucking.  Signs that your baby has not successfully latched on to nipple:   Sucking sounds or smacking sounds from your baby while breastfeeding.  Nipple pain. If you think your baby has not latched on correctly, slip your finger into the corner of your baby's mouth to break the suction and place it between your baby's gums. Attempt breastfeeding initiation again. Signs of Successful Breastfeeding Signs from your baby:   A gradual decrease in the number of sucks or complete cessation of sucking.   Falling asleep.   Relaxation of his or her body.    Retention of a small amount of milk in his or her mouth.   Letting go of your breast by himself or herself. Signs from you:  Breasts that have increased in firmness, weight, and size 1-3 hours after feeding.   Breasts that are softer immediately after breastfeeding.  Increased milk volume, as well as a change in milk consistency and color by the fifth day of breastfeeding.   Nipples that are not sore, cracked, or bleeding. Signs That Your Baby is Getting Enough Milk  Wetting at least 3 diapers in a 24-hour period. The urine should be clear and pale yellow by age 5 days.  At least 3 stools in a 24-hour period by age 5 days. The stool should be soft and yellow.  At least 3 stools in a 24-hour period by age 7 days. The stool should be seedy and yellow.  No loss of weight greater than 10% of birth weight during the first 3 days of age.  Average weight gain of 4-7 ounces (113-198 g) per week after age 4 days.  Consistent daily weight gain by age 5 days, without weight loss after the age of 2 weeks. After a feeding, your baby may spit up a small amount. This is common. BREASTFEEDING FREQUENCY AND DURATION Frequent feeding will help you make more milk and can prevent sore nipples and breast engorgement. Breastfeed when you feel the need to reduce the fullness of your breasts or when your baby shows signs of hunger. This is called "breastfeeding on demand." Avoid introducing a pacifier to your baby while you are working to establish breastfeeding (the first 4-6 weeks after your baby is born). After this time you may choose to use a pacifier. Research has shown that pacifier use during the first year of a baby's life decreases the risk of sudden infant death syndrome (SIDS). Allow your baby to feed on each breast as long as he or she wants. Breastfeed until your baby is finished feeding. When your baby unlatches or falls asleep while feeding from the first breast, offer the second breast.  Because newborns are often sleepy in the   first few weeks of life, you may need to awaken your baby to get him or her to feed. Breastfeeding times will vary from baby to baby. However, the following rules can serve as a guide to help you ensure that your baby is properly fed:  Newborns (babies 4 weeks of age or younger) may breastfeed every 1-3 hours.  Newborns should not go longer than 3 hours during the day or 5 hours during the night without breastfeeding.  You should breastfeed your baby a minimum of 8 times in a 24-hour period until you begin to introduce solid foods to your baby at around 6 months of age. BREAST MILK PUMPING Pumping and storing breast milk allows you to ensure that your baby is exclusively fed your breast milk, even at times when you are unable to breastfeed. This is especially important if you are going back to work while you are still breastfeeding or when you are not able to be present during feedings. Your lactation consultant can give you guidelines on how long it is safe to store breast milk.  A breast pump is a machine that allows you to pump milk from your breast into a sterile bottle. The pumped breast milk can then be stored in a refrigerator or freezer. Some breast pumps are operated by hand, while others use electricity. Ask your lactation consultant which type will work best for you. Breast pumps can be purchased, but some hospitals and breastfeeding support groups lease breast pumps on a monthly basis. A lactation consultant can teach you how to hand express breast milk, if you prefer not to use a pump.  CARING FOR YOUR BREASTS WHILE YOU BREASTFEED Nipples can become dry, cracked, and sore while breastfeeding. The following recommendations can help keep your breasts moisturized and healthy:  Avoid using soap on your nipples.   Wear a supportive bra. Although not required, special nursing bras and tank tops are designed to allow access to your breasts for  breastfeeding without taking off your entire bra or top. Avoid wearing underwire-style bras or extremely tight bras.  Air dry your nipples for 3-4minutes after each feeding.   Use only cotton bra pads to absorb leaked breast milk. Leaking of breast milk between feedings is normal.   Use lanolin on your nipples after breastfeeding. Lanolin helps to maintain your skin's normal moisture barrier. If you use pure lanolin, you do not need to wash it off before feeding your baby again. Pure lanolin is not toxic to your baby. You may also hand express a few drops of breast milk and gently massage that milk into your nipples and allow the milk to air dry. In the first few weeks after giving birth, some women experience extremely full breasts (engorgement). Engorgement can make your breasts feel heavy, warm, and tender to the touch. Engorgement peaks within 3-5 days after you give birth. The following recommendations can help ease engorgement:  Completely empty your breasts while breastfeeding or pumping. You may want to start by applying warm, moist heat (in the shower or with warm water-soaked hand towels) just before feeding or pumping. This increases circulation and helps the milk flow. If your baby does not completely empty your breasts while breastfeeding, pump any extra milk after he or she is finished.  Wear a snug bra (nursing or regular) or tank top for 1-2 days to signal your body to slightly decrease milk production.  Apply ice packs to your breasts, unless this is too uncomfortable for you.    Make sure that your baby is latched on and positioned properly while breastfeeding. If engorgement persists after 48 hours of following these recommendations, contact your health care provider or a lactation consultant. OVERALL HEALTH CARE RECOMMENDATIONS WHILE BREASTFEEDING  Eat healthy foods. Alternate between meals and snacks, eating 3 of each per day. Because what you eat affects your breast milk,  some of the foods may make your baby more irritable than usual. Avoid eating these foods if you are sure that they are negatively affecting your baby.  Drink milk, fruit juice, and water to satisfy your thirst (about 10 glasses a day).   Rest often, relax, and continue to take your prenatal vitamins to prevent fatigue, stress, and anemia.  Continue breast self-awareness checks.  Avoid chewing and smoking tobacco.  Avoid alcohol and drug use. Some medicines that may be harmful to your baby can pass through breast milk. It is important to ask your health care provider before taking any medicine, including all over-the-counter and prescription medicine as well as vitamin and herbal supplements. It is possible to become pregnant while breastfeeding. If birth control is desired, ask your health care provider about options that will be safe for your baby. SEEK MEDICAL CARE IF:   You feel like you want to stop breastfeeding or have become frustrated with breastfeeding.  You have painful breasts or nipples.  Your nipples are cracked or bleeding.  Your breasts are red, tender, or warm.  You have a swollen area on either breast.  You have a fever or chills.  You have nausea or vomiting.  You have drainage other than breast milk from your nipples.  Your breasts do not become full before feedings by the fifth day after you give birth.  You feel sad and depressed.  Your baby is too sleepy to eat well.  Your baby is having trouble sleeping.   Your baby is wetting less than 3 diapers in a 24-hour period.  Your baby has less than 3 stools in a 24-hour period.  Your baby's skin or the white part of his or her eyes becomes yellow.   Your baby is not gaining weight by 5 days of age. SEEK IMMEDIATE MEDICAL CARE IF:   Your baby is overly tired (lethargic) and does not want to wake up and feed.  Your baby develops an unexplained fever. Document Released: 07/22/2005 Document Revised:  07/27/2013 Document Reviewed: 01/13/2013 [employer] Patient Information 2015 [employer]. This information is not intended to replace advice given to you by your health care provider. Make sure you discuss any questions you have with your health care provider.  
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
## 2015-03-16 NOTE — Progress Notes (Signed)
Pt reports that she has been having vomiting and diarrhea all day yesterday and last night.  Pt would like blood test for hsv.

## 2015-03-17 LAB — HSV(HERPES SIMPLEX VRS) I + II AB-IGG
HSV 1 Glycoprotein G Ab, IgG: 3.65 IV — ABNORMAL HIGH
HSV 2 Glycoprotein G Ab, IgG: <0.1 IV

## 2015-03-20 ENCOUNTER — Telehealth: Payer: Self-pay | Admitting: *Deleted

## 2015-03-20 NOTE — Telephone Encounter (Signed)
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 Without the patient having symptoms it is difficult to know what to make of this result (per Dr Shawnie Pons). Called patient, no answer- left message stating we are trying to reach you to return your phone call and to inform you of results, please call us back at the clinics

## 2015-03-20 NOTE — Telephone Encounter (Signed)
[name] called Friday 812/16 am and left a message requesting test results of std blood test.

## 2015-03-20 NOTE — Telephone Encounter (Addendum)
Rayven left a voicemail message at 2:31 she just missed our call re: test results.  Called Zeynep and left another message we are returning her call. She may call us tomorrow.   8/16  1045  Pt had left additional message on voice mail yesterday @ 1636 stating that she missed our call. She requested a call back and stated that a detailed message can be left on her voice mail. 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 I provided information and given by Dr. Shawnie Pons and advised that without her having sx it is difficult to know what to make of the results. She should notify us if she has sx of outbreak on lips or genitals.  Pt voiced understanding.  Diane Day RNC

## 2015-03-21 ENCOUNTER — Encounter (HOSPITAL_COMMUNITY): Payer: Self-pay | Admitting: *Deleted

## 2015-03-21 ENCOUNTER — Encounter (HOSPITAL_COMMUNITY): Payer: Self-pay

## 2015-03-23 ENCOUNTER — Ambulatory Visit (INDEPENDENT_AMBULATORY_CARE_PROVIDER_SITE_OTHER): Payer: Self-pay

## 2015-03-23 VITALS — BP 96/56 | HR 96 | Temp 98.2°F | Resp 16 | Wt 131.7 lb

## 2015-03-23 DIAGNOSIS — O09212 Supervision of pregnancy with history of pre-term labor, second trimester: Secondary | ICD-10-CM

## 2015-03-23 DIAGNOSIS — O09892 Supervision of other high risk pregnancies, second trimester: Secondary | ICD-10-CM

## 2015-03-29 ENCOUNTER — Encounter: Payer: Self-pay | Admitting: General Practice

## 2015-03-29 NOTE — Progress Notes (Unsigned)
Refill of makena ordered 

## 2015-03-30 ENCOUNTER — Ambulatory Visit: Payer: Self-pay

## 2015-04-06 ENCOUNTER — Ambulatory Visit (INDEPENDENT_AMBULATORY_CARE_PROVIDER_SITE_OTHER): Payer: Self-pay | Admitting: Family Medicine

## 2015-04-06 VITALS — BP 110/68 | HR 85 | Temp 99.0°F | Wt 134.1 lb

## 2015-04-06 DIAGNOSIS — M549 Dorsalgia, unspecified: Secondary | ICD-10-CM

## 2015-04-06 DIAGNOSIS — O26899 Other specified pregnancy related conditions, unspecified trimester: Secondary | ICD-10-CM

## 2015-04-06 DIAGNOSIS — O0992 Supervision of high risk pregnancy, unspecified, second trimester: Secondary | ICD-10-CM

## 2015-04-06 DIAGNOSIS — O9989 Other specified diseases and conditions complicating pregnancy, childbirth and the puerperium: Secondary | ICD-10-CM

## 2015-04-06 DIAGNOSIS — O99891 Other specified diseases and conditions complicating pregnancy: Secondary | ICD-10-CM

## 2015-04-06 DIAGNOSIS — O09892 Supervision of other high risk pregnancies, second trimester: Secondary | ICD-10-CM

## 2015-04-06 DIAGNOSIS — O09212 Supervision of pregnancy with history of pre-term labor, second trimester: Secondary | ICD-10-CM

## 2015-04-06 LAB — POCT URINALYSIS DIP (DEVICE)
Bilirubin Urine: NEGATIVE
Glucose, UA: NEGATIVE mg/dL
Hgb urine dipstick: NEGATIVE
Ketones, ur: NEGATIVE mg/dL
Leukocytes, UA: NEGATIVE
Nitrite: NEGATIVE
Protein, ur: NEGATIVE mg/dL
Specific Gravity, Urine: 1.025 (ref 1.005–1.030)
Urobilinogen, UA: 0.2 mg/dL (ref 0.0–1.0)
pH: 6 (ref 5.0–8.0)

## 2015-04-06 NOTE — Patient Instructions (Signed)

## 2015-04-06 NOTE — Progress Notes (Signed)
Breastfeeding tip of the week reviewed.  Patient declines flu Complaining of frequent dizzy spells

## 2015-04-06 NOTE — Progress Notes (Signed)
Subjective:  [name] is a 29 y.o. [id number] at [redacted]w[redacted]d being seen today for ongoing prenatal care.  Patient reports backache.  Contractions: Irritability.  Vag. Bleeding: None. Movement: Present. Denies leaking of fluid.   The following portions of the patient's history were reviewed and updated as appropriate: allergies, current medications, past family history, past medical history, past social history, past surgical history and problem list.   Objective:   Filed Vitals:   04/06/15 1028  BP: 110/68  Pulse: 85  Temp: 99 F (37.2 C)  Weight: 134 lb 1.6 oz (60.827 kg)    Fetal Status: Fetal Heart Rate (bpm): 145   Movement: Present     General:  Alert, oriented and cooperative. Patient is in no acute distress.  Skin: Skin is warm and dry. No rash noted.   Cardiovascular: Normal heart rate noted  Respiratory: Normal respiratory effort, no problems with respiration noted  Abdomen: Soft, gravid, appropriate for gestational age. Pain/Pressure: Present     Pelvic: Vag. Bleeding: None     Cervical exam deferred        Extremities: Normal range of motion.  Edema: None  Mental Status: Normal mood and affect. Normal behavior. Normal judgment and thought content.   Urinalysis: Urine Protein: Negative Urine Glucose: Negative  Assessment and Plan:  Pregnancy: [id number] at [redacted]w[redacted]d  1. Supervision of high risk pregnancy, antepartum, second trimester FHT normal, FH normal  2.  Preterm Labor Patient left before 17-P given  3.   Back pain in pregnancy Back exercises demonstrated  Preterm labor symptoms and general obstetric precautions including but not limited to vaginal bleeding, contractions, leaking of fluid and fetal movement were reviewed in detail with the patient. Please refer to After Visit Summary for other counseling recommendations.  Return in about 4 weeks (around 05/04/2015).   Levie Heritage, DO

## 2015-04-13 ENCOUNTER — Inpatient Hospital Stay (HOSPITAL_COMMUNITY)
Admission: AD | Admit: 2015-04-13 | Discharge: 2015-04-13 | Disposition: A | Discharge status: Home / Self Care | Payer: Medicaid Other | Source: Ambulatory Visit | Attending: Family Medicine | Admitting: Family Medicine

## 2015-04-13 ENCOUNTER — Encounter (HOSPITAL_COMMUNITY): Payer: Self-pay | Admitting: *Deleted

## 2015-04-13 DIAGNOSIS — O0992 Supervision of high risk pregnancy, unspecified, second trimester: Secondary | ICD-10-CM

## 2015-04-13 DIAGNOSIS — O09212 Supervision of pregnancy with history of pre-term labor, second trimester: Secondary | ICD-10-CM

## 2015-04-13 DIAGNOSIS — O4700 False labor before 37 completed weeks of gestation, unspecified trimester: Secondary | ICD-10-CM

## 2015-04-13 DIAGNOSIS — Z6791 Unspecified blood type, Rh negative: Secondary | ICD-10-CM | POA: Insufficient documentation

## 2015-04-13 DIAGNOSIS — O09892 Supervision of other high risk pregnancies, second trimester: Secondary | ICD-10-CM

## 2015-04-13 DIAGNOSIS — O26892 Other specified pregnancy related conditions, second trimester: Secondary | ICD-10-CM | POA: Diagnosis not present

## 2015-04-13 DIAGNOSIS — O99820 Streptococcus B carrier state complicating pregnancy: Secondary | ICD-10-CM | POA: Insufficient documentation

## 2015-04-13 DIAGNOSIS — O26899 Other specified pregnancy related conditions, unspecified trimester: Secondary | ICD-10-CM

## 2015-04-13 DIAGNOSIS — O9989 Other specified diseases and conditions complicating pregnancy, childbirth and the puerperium: Secondary | ICD-10-CM

## 2015-04-13 DIAGNOSIS — R109 Unspecified abdominal pain: Secondary | ICD-10-CM

## 2015-04-13 DIAGNOSIS — Z87891 Personal history of nicotine dependence: Secondary | ICD-10-CM | POA: Insufficient documentation

## 2015-04-13 DIAGNOSIS — O34219 Maternal care for unspecified type scar from previous cesarean delivery: Secondary | ICD-10-CM

## 2015-04-13 DIAGNOSIS — Z3A25 25 weeks gestation of pregnancy: Secondary | ICD-10-CM | POA: Insufficient documentation

## 2015-04-13 DIAGNOSIS — O3421 Maternal care for scar from previous cesarean delivery: Secondary | ICD-10-CM | POA: Diagnosis not present

## 2015-04-13 DIAGNOSIS — R1030 Lower abdominal pain, unspecified: Secondary | ICD-10-CM | POA: Diagnosis not present

## 2015-04-13 DIAGNOSIS — O360121 Maternal care for anti-D [Rh] antibodies, second trimester, fetus 1: Secondary | ICD-10-CM

## 2015-04-13 DIAGNOSIS — O4702 False labor before 37 completed weeks of gestation, second trimester: Secondary | ICD-10-CM

## 2015-04-13 DIAGNOSIS — O2342 Unspecified infection of urinary tract in pregnancy, second trimester: Secondary | ICD-10-CM

## 2015-04-13 DIAGNOSIS — B951 Streptococcus, group B, as the cause of diseases classified elsewhere: Secondary | ICD-10-CM

## 2015-04-13 LAB — CBC
HEMATOCRIT: 30.9 % — AB (ref 36.0–46.0)
HEMOGLOBIN: 10.3 g/dL — AB (ref 12.0–15.0)
MCH: 29.4 pg (ref 26.0–34.0)
MCHC: 33.3 g/dL (ref 30.0–36.0)
MCV: 88.3 fL (ref 78.0–100.0)
Platelets: 256 10*3/uL (ref 150–400)
RBC: 3.5 MIL/uL — ABNORMAL LOW (ref 3.87–5.11)
RDW: 13.8 % (ref 11.5–15.5)
WBC: 13.9 10*3/uL — AB (ref 4.0–10.5)

## 2015-04-13 LAB — URINALYSIS, ROUTINE W REFLEX MICROSCOPIC
Bilirubin Urine: NEGATIVE
Bilirubin Urine: NEGATIVE
GLUCOSE, UA: NEGATIVE mg/dL
Glucose, UA: NEGATIVE mg/dL
Hgb urine dipstick: NEGATIVE
Hgb urine dipstick: NEGATIVE
Ketones, ur: 15 mg/dL — AB
LEUKOCYTES UA: NEGATIVE
Leukocytes, UA: NEGATIVE
NITRITE: NEGATIVE
Nitrite: NEGATIVE
PH: 6.5 (ref 5.0–8.0)
Protein, ur: NEGATIVE mg/dL
Protein, ur: NEGATIVE mg/dL
SPECIFIC GRAVITY, URINE: 1.015 (ref 1.005–1.030)
Specific Gravity, Urine: 1.02 (ref 1.005–1.030)
Urobilinogen, UA: 0.2 mg/dL (ref 0.0–1.0)
Urobilinogen, UA: 0.2 mg/dL (ref 0.0–1.0)
pH: 6 (ref 5.0–8.0)

## 2015-04-13 LAB — COMPREHENSIVE METABOLIC PANEL
ALBUMIN: 3.1 g/dL — AB (ref 3.5–5.0)
ALK PHOS: 67 U/L (ref 38–126)
ALT: 12 U/L — ABNORMAL LOW (ref 14–54)
AST: 15 U/L (ref 15–41)
Anion gap: 8 (ref 5–15)
BILIRUBIN TOTAL: 0.2 mg/dL — AB (ref 0.3–1.2)
BUN: 8 mg/dL (ref 6–20)
CALCIUM: 8.4 mg/dL — AB (ref 8.9–10.3)
CO2: 22 mmol/L (ref 22–32)
Chloride: 104 mmol/L (ref 101–111)
Creatinine, Ser: 0.43 mg/dL — ABNORMAL LOW (ref 0.44–1.00)
GFR calc Af Amer: >60 mL/min (ref >60)
GFR calc non Af Amer: >60 mL/min (ref >60)
GLUCOSE: 83 mg/dL (ref 65–99)
POTASSIUM: 3.7 mmol/L (ref 3.5–5.1)
SODIUM: 134 mmol/L — AB (ref 135–145)
TOTAL PROTEIN: 6.6 g/dL (ref 6.5–8.1)

## 2015-04-13 LAB — FETAL FIBRONECTIN: FETAL FIBRONECTIN: NEGATIVE

## 2015-04-13 LAB — RAPID URINE DRUG SCREEN, HOSP PERFORMED
AMPHETAMINES: NOT DETECTED
BARBITURATES: NOT DETECTED
BENZODIAZEPINES: NOT DETECTED
Cocaine: NOT DETECTED
Opiates: NOT DETECTED
Tetrahydrocannabinol: POSITIVE — AB

## 2015-04-13 LAB — WET PREP, GENITAL
Clue Cells Wet Prep HPF POC: NONE SEEN
TRICH WET PREP: NONE SEEN
YEAST WET PREP: NONE SEEN

## 2015-04-13 MED ORDER — BETAMETHASONE SOD PHOS & ACET 6 (3-3) MG/ML IJ SUSP
12.0000 mg | Freq: Once | INTRAMUSCULAR | Status: AC
  Administered 2015-04-13: 12 mg via INTRAMUSCULAR
  Filled 2015-04-13: qty 2

## 2015-04-13 MED ORDER — LACTATED RINGERS IV BOLUS (SEPSIS)
1000.0000 mL | Freq: Once | INTRAVENOUS | Status: AC
  Administered 2015-04-13: 1000 mL via INTRAVENOUS

## 2015-04-13 MED ORDER — RHO D IMMUNE GLOBULIN 1500 UNIT/2ML IJ SOSY
300.0000 ug | PREFILLED_SYRINGE | Freq: Once | INTRAMUSCULAR | Status: AC
  Administered 2015-04-13: 300 ug via INTRAMUSCULAR
  Filled 2015-04-13: qty 2
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
## 2015-04-13 NOTE — Discharge Instructions (Signed)
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
## 2015-04-13 NOTE — MAU Provider Note (Signed)
History     CSN: [id number]  Arrival date and time: 04/13/15 4098   First Provider Initiated Contact with Patient 04/13/15 [phone]      Chief Complaint  Patient presents with  . Contractions   HPI Patient is 29 y.o. [id number] [redacted]w[redacted]d here with complaints of contractions that started about 7am today. Patient states they come intermittently and are painful enough to take her breath away but not as bad as what she remembers from prior pregnancies. She is still feeling baby moving a lot, no LOF, no VB, has mucous discharge without any blood streaks. Has a hx of 3 prior preterm deliveries at 32 weeks. Supposed to be on 17-P but missed 3 weeks of shots.   UTI in July- did not complete antibiotics (Amoxicillin- for GBS bacteruria)   OB History    Gravida Para Term Preterm AB TAB SAB Ectopic Multiple Living   8 3 0 3 4 1 3  0  3      Past Medical History  Diagnosis Date  . Anxiety   . Depression   . Genital warts   . Anxiety 11/21/2011  . Anemia   . Complication of anesthesia     Swelling from some drug used for anesthesia. Does not  know name.  . Genital warts   . HPV test positive   . RhD negative   . Preterm labor     Past Surgical History  Procedure Laterality Date  . Cesarean section    . Dilation and curettage of uterus    . Fracture surgery Right     pinky finger  . Fractured finger  2001    open reduction  . Therapeutic abortion    . Cesarean section  2007, 2009, 2010    x3    Family History  Problem Relation Age of Onset  . Heart disease Father   . Hyperlipidemia Father     Social History  Substance Use Topics  . Smoking status: Former Smoker -- 1.00 packs/day for 8 years    Types: Cigarettes    Quit date: 02/21/2014  . Smokeless tobacco: Never Used  . Alcohol Use: No     Comment: socially    Allergies:  Allergies  Allergen Reactions  . Other Anaphylaxis    Childhood reaction, finger surgery, total body swelling to anesthesia  medication  . Latex  Itching and Swelling  . Latex Swelling and Other (See Comments)    redness    Facility-administered medications prior to admission  Medication Dose Route Frequency Provider Last Rate Last Dose  . hydroxyprogesterone caproate (DELALUTIN) 250 mg/mL injection 250 mg  250 mg Intramuscular Weekly Tereso Newcomer, MD   250 mg at 03/23/15 1109   Prescriptions prior to admission  Medication Sig Dispense Refill Last Dose  . Prenatal Vit-Fe Fumarate-FA (PRENATAL MULTIVITAMIN) TABS tablet Take 1 tablet by mouth daily.   Past Week at Unknown time  . amoxicillin (AMOXIL) 500 MG capsule Take 1 capsule (500 mg total) by mouth 3 (three) times daily. (Patient not taking: Reported on 04/13/2015) 21 capsule 0 Not Taking  . cyclobenzaprine (FLEXERIL) 10 MG tablet Take 1 tablet (10 mg total) by mouth 2 (two) times daily as needed for muscle spasms. (Patient not taking: Reported on 02/23/2015) 20 tablet 0 Not Taking    Review of Systems  Constitutional: Negative for fever and chills.  Eyes: Negative for blurred vision and double vision.  Respiratory: Negative for cough and shortness of breath.   Cardiovascular: Negative  for chest pain and orthopnea.  Gastrointestinal: Negative for nausea and vomiting.  Genitourinary: Negative for dysuria, frequency and flank pain.  Musculoskeletal: Negative for myalgias.  Skin: Negative for rash.  Neurological: Negative for dizziness, tingling, weakness and headaches.  Endo/Heme/Allergies: Does not bruise/bleed easily.  Psychiatric/Behavioral: Negative for depression and suicidal ideas. The patient is not nervous/anxious.    Physical Exam   Blood pressure 94/68, pulse 89, temperature 98.4 F (36.9 C), temperature source Oral, resp. rate 18, last menstrual period 10/16/2014.  Physical Exam  Nursing note and vitals reviewed. Constitutional: She is oriented to person, place, and time. She appears well-developed and well-nourished. No distress.  Pregnant female  HENT:   Head: Normocephalic and atraumatic.  Eyes: Conjunctivae are normal. No scleral icterus.  Neck: Normal range of motion. Neck supple.  Cardiovascular: Normal rate and intact distal pulses.   Respiratory: Effort normal. She exhibits no tenderness.  GI: Soft. There is no tenderness. There is no rebound and no guarding.  Gravid  Genitourinary: Vagina normal.  Musculoskeletal: Normal range of motion. She exhibits no edema.  Neurological: She is alert and oriented to person, place, and time.  Skin: Skin is warm and dry. No rash noted.  Psychiatric: She has a normal mood and affect.   Cervical exam: fingertip/thick/-3 MAU Course  Procedures  MDM: reviewed EFM, category 1 tracing, no contractions, sterile speculum exam obtain and bimanual exam showed cervix , obtained FFN and determined not in preterm labor.  UDS positive for marijuana CMP CBC Type&Screen RPR LR 1l  Assessment and Plan  Lower abdominal pain  Labs as above negative.  Gave 1L bolus of LR  Abdominal pain improved after fluids  F/u tomorrow for nursing visit to get 17P given h/o 3 prior preterm labor at 32 weeks  Given preterm labor precautions  Received BMZ inj, 2nd dose to be given tomorrow in clinic  Maryjo Rochester Resident Physician  OB fellow attestation: I have seen and examined this patient; I agree with above documentation in the resident's note.  DORE OQUIN is a 29 y.o. [phone] reporting preterm contractions in the setting of h/o preterm delivery.  +FM, denies LOF, VB,  vaginal discharge.  PE: BP 94/60 mmHg  Pulse 89  Temp(Src) 97.9 F (36.6 C) (Oral)  Resp 18  LMP 10/16/2014 (Within Days) Gen: calm comfortable, NAD Resp: normal effort, no distress Abd: gravid  ROS, labs, PMH reviewed NST appropriate for gestational age  Plan: - Contractions resolved with IVF - Received BMZ x1 and will need additional dose on 9/9 in clinic as well as 17P - continue routine follow up in OB clinic-  recommend repeat urine culture given that patient did not complete ABX  Federico Flake, MD 3:52 PM

## 2015-04-13 NOTE — MAU Note (Addendum)
Pt stated she stared having increased pelvic pressure and ctx about an hour ago. Has had 2 deliveries at 34 weeks. On 17P shots but has missed the last 2 or 3 shots due to transportation problems.
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
## 2015-04-14 ENCOUNTER — Ambulatory Visit (INDEPENDENT_AMBULATORY_CARE_PROVIDER_SITE_OTHER): Payer: Self-pay | Admitting: *Deleted

## 2015-04-14 VITALS — BP 105/61 | HR 86 | Temp 98.7°F | Wt 129.9 lb

## 2015-04-14 DIAGNOSIS — O4702 False labor before 37 completed weeks of gestation, second trimester: Secondary | ICD-10-CM

## 2015-04-14 LAB — RH IG WORKUP (INCLUDES ABO/RH)
ABO/RH(D): O NEG
ANTIBODY SCREEN: NEGATIVE
FETAL SCREEN: NEGATIVE
Gestational Age(Wks): 25
UNIT DIVISION: 0

## 2015-04-14 LAB — GC/CHLAMYDIA PROBE AMP (~~LOC~~) NOT AT ARMC
Chlamydia: NEGATIVE
Neisseria Gonorrhea: NEGATIVE

## 2015-04-14 MED ORDER — BETAMETHASONE SOD PHOS & ACET 6 (3-3) MG/ML IJ SUSP
12.0000 mg | Freq: Once | INTRAMUSCULAR | Status: AC
  Administered 2015-04-14: 12 mg via INTRAMUSCULAR

## 2015-04-26 ENCOUNTER — Telehealth: Payer: Self-pay | Admitting: *Deleted

## 2015-04-26 NOTE — Telephone Encounter (Signed)
Received a voice message from [facility] for [facility] requesting a call. Returned call and they were calling to see if she needed any refills of her Makena. Informed them she has 3 left- he stated they would send next refill Friday.  Per chart review noted [name] does not have any appointments for a shot for this week or last week, has one for next week. Called her and and left a message I am calling about appointment we need to schedule and to please call the clinic.

## 2015-04-27 NOTE — Telephone Encounter (Signed)
Called [name], a female answered and I stated I was calling from [facility] and  then the call was cut off. Called [name] back and got her voice mail. I left a message we are calling about an appointment. Patient does have obfu/ 17p scheduled 05/04/15.

## 2015-05-04 ENCOUNTER — Ambulatory Visit (INDEPENDENT_AMBULATORY_CARE_PROVIDER_SITE_OTHER): Payer: Medicaid Other | Admitting: Family Medicine

## 2015-05-04 VITALS — BP 94/61 | HR 81 | Temp 98.9°F | Wt 132.4 lb

## 2015-05-04 DIAGNOSIS — Z23 Encounter for immunization: Secondary | ICD-10-CM

## 2015-05-04 DIAGNOSIS — A630 Anogenital (venereal) warts: Secondary | ICD-10-CM | POA: Diagnosis not present

## 2015-05-04 DIAGNOSIS — O3421 Maternal care for scar from previous cesarean delivery: Secondary | ICD-10-CM

## 2015-05-04 DIAGNOSIS — O09213 Supervision of pregnancy with history of pre-term labor, third trimester: Secondary | ICD-10-CM | POA: Diagnosis not present

## 2015-05-04 DIAGNOSIS — O09893 Supervision of other high risk pregnancies, third trimester: Secondary | ICD-10-CM

## 2015-05-04 DIAGNOSIS — O0992 Supervision of high risk pregnancy, unspecified, second trimester: Secondary | ICD-10-CM

## 2015-05-04 DIAGNOSIS — O34219 Maternal care for unspecified type scar from previous cesarean delivery: Secondary | ICD-10-CM

## 2015-05-04 LAB — POCT URINALYSIS DIP (DEVICE)
BILIRUBIN URINE: NEGATIVE
Glucose, UA: NEGATIVE mg/dL
Hgb urine dipstick: NEGATIVE
KETONES UR: NEGATIVE mg/dL
LEUKOCYTES UA: NEGATIVE
NITRITE: NEGATIVE
PH: 7.5 (ref 5.0–8.0)
Protein, ur: NEGATIVE mg/dL
Specific Gravity, Urine: 1.02 (ref 1.005–1.030)
Urobilinogen, UA: 0.2 mg/dL (ref 0.0–1.0)

## 2015-05-04 MED ORDER — TETANUS-DIPHTH-ACELL PERTUSSIS 5-2.5-18.5 LF-MCG/0.5 IM SUSP
0.5000 mL | Freq: Once | INTRAMUSCULAR | Status: AC
  Administered 2015-05-04: 0.5 mL via INTRAMUSCULAR

## 2015-05-04 NOTE — Patient Instructions (Signed)
Third Trimester of Pregnancy The third trimester is from week 29 through week 42, months 7 through 9. The third trimester is a time when the fetus is growing rapidly. At the end of the ninth month, the fetus is about 20 inches in length and weighs 6-10 pounds.  BODY CHANGES Your body goes through many changes during pregnancy. The changes vary from woman to woman.   Your weight will continue to increase. You can expect to gain 25-35 pounds (11-16 kg) by the end of the pregnancy.  You may begin to get stretch marks on your hips, abdomen, and breasts.  You may urinate more often because the fetus is moving lower into your pelvis and pressing on your bladder.  You may develop or continue to have heartburn as a result of your pregnancy.  You may develop constipation because certain hormones are causing the muscles that push waste through your intestines to slow down.  You may develop hemorrhoids or swollen, bulging veins (varicose veins).  You may have pelvic pain because of the weight gain and pregnancy hormones relaxing your joints between the bones in your pelvis. Backaches may result from overexertion of the muscles supporting your posture.  You may have changes in your hair. These can include thickening of your hair, rapid growth, and changes in texture. Some women also have hair loss during or after pregnancy, or hair that feels dry or thin. Your hair will most likely return to normal after your baby is born.  Your breasts will continue to grow and be tender. A yellow discharge may leak from your breasts called colostrum.  Your belly button may stick out.  You may feel short of breath because of your expanding uterus.  You may notice the fetus "dropping," or moving lower in your abdomen.  You may have a bloody mucus discharge. This usually occurs a few days to a week before labor begins.  Your cervix becomes thin and soft (effaced) near your due date. WHAT TO EXPECT AT YOUR PRENATAL  EXAMS  You will have prenatal exams every 2 weeks until week 36. Then, you will have weekly prenatal exams. During a routine prenatal visit:  You will be weighed to make sure you and the fetus are growing normally.  Your blood pressure is taken.  Your abdomen will be measured to track your baby's growth.  The fetal heartbeat will be listened to.  Any test results from the previous visit will be discussed.  You may have a cervical check near your due date to see if you have effaced. At around 36 weeks, your caregiver will check your cervix. At the same time, your caregiver will also perform a test on the secretions of the vaginal tissue. This test is to determine if a type of bacteria, Group B streptococcus, is present. Your caregiver will explain this further. Your caregiver may ask you:  What your birth plan is.  How you are feeling.  If you are feeling the baby move.  If you have had any abnormal symptoms, such as leaking fluid, bleeding, severe headaches, or abdominal cramping.  If you have any questions. Other tests or screenings that may be performed during your third trimester include:  Blood tests that check for low iron levels (anemia).  Fetal testing to check the health, activity level, and growth of the fetus. Testing is done if you have certain medical conditions or if there are problems during the pregnancy. FALSE LABOR You may feel small, irregular contractions that   eventually go away. These are called Braxton Hicks contractions, or false labor. Contractions may last for hours, days, or even weeks before true labor sets in. If contractions come at regular intervals, intensify, or become painful, it is best to be seen by your caregiver.  SIGNS OF LABOR   Menstrual-like cramps.  Contractions that are 5 minutes apart or less.  Contractions that start on the top of the uterus and spread down to the lower abdomen and back.  A sense of increased pelvic pressure or back  pain.  A watery or bloody mucus discharge that comes from the vagina. If you have any of these signs before the 37th week of pregnancy, call your caregiver right away. You need to go to the hospital to get checked immediately. HOME CARE INSTRUCTIONS   Avoid all smoking, herbs, alcohol, and unprescribed drugs. These chemicals affect the formation and growth of the baby.  Follow your caregiver's instructions regarding medicine use. There are medicines that are either safe or unsafe to take during pregnancy.  Exercise only as directed by your caregiver. Experiencing uterine cramps is a good sign to stop exercising.  Continue to eat regular, healthy meals.  Wear a good support bra for breast tenderness.  Do not use hot tubs, steam rooms, or saunas.  Wear your seat belt at all times when driving.  Avoid raw meat, uncooked cheese, cat litter boxes, and soil used by cats. These carry germs that can cause birth defects in the baby.  Take your prenatal vitamins.  Try taking a stool softener (if your caregiver approves) if you develop constipation. Eat more high-fiber foods, such as fresh vegetables or fruit and whole grains. Drink plenty of fluids to keep your urine clear or pale yellow.  Take warm sitz baths to soothe any pain or discomfort caused by hemorrhoids. Use hemorrhoid cream if your caregiver approves.  If you develop varicose veins, wear support hose. Elevate your feet for 15 minutes, 3-4 times a day. Limit salt in your diet.  Avoid heavy lifting, wear low heal shoes, and practice good posture.  Rest a lot with your legs elevated if you have leg cramps or low back pain.  Visit your dentist if you have not gone during your pregnancy. Use a soft toothbrush to brush your teeth and be gentle when you floss.  A sexual relationship may be continued unless your caregiver directs you otherwise.  Do not travel far distances unless it is absolutely necessary and only with the approval  of your caregiver.  Take prenatal classes to understand, practice, and ask questions about the labor and delivery.  Make a trial run to the hospital.  Pack your hospital bag.  Prepare the baby's nursery.  Continue to go to all your prenatal visits as directed by your caregiver. SEEK MEDICAL CARE IF:  You are unsure if you are in labor or if your water has broken.  You have dizziness.  You have mild pelvic cramps, pelvic pressure, or nagging pain in your abdominal area.  You have persistent nausea, vomiting, or diarrhea.  You have a bad smelling vaginal discharge.  You have pain with urination. SEEK IMMEDIATE MEDICAL CARE IF:   You have a fever.  You are leaking fluid from your vagina.  You have spotting or bleeding from your vagina.  You have severe abdominal cramping or pain.  You have rapid weight loss or gain.  You have shortness of breath with chest pain.  You notice sudden or extreme swelling   of your face, hands, ankles, feet, or legs.  You have not felt your baby move in over an hour.  You have severe headaches that do not go away with medicine.  You have vision changes. Document Released: 07/16/2001 Document Revised: 07/27/2013 Document Reviewed: 09/22/2012 ExitCare Patient Information 2015 [employer]. This information is not intended to replace advice given to you by your health care provider. Make sure you discuss any questions you have with your health care provider.  

## 2015-05-04 NOTE — Progress Notes (Signed)
Subjective:  [name] is a 29 y.o. [phone] at [redacted]w[redacted]d being seen today for ongoing prenatal care.  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  Has pain with sitting.  Pain started a few weeks ago - worse with sitting, improved with standing.  Was prescribed Imiquimod, but cannot afford and does not have medicaid yet..  Contractions: Irritability.  Vag. Bleeding: None. Movement: Present. Denies leaking of fluid.   The following portions of the patient's history were reviewed and updated as appropriate: allergies, current medications, past family history, past medical history, past social history, past surgical history and problem list.   Objective:   Filed Vitals:   05/04/15 1045  BP: 94/61  Pulse: 81  Temp: 98.9 F (37.2 C)  Weight: 132 lb 6.4 oz (60.056 kg)    Fetal Status: Fetal Heart Rate (bpm): 140   Movement: Present     General:  Alert, oriented and cooperative. Patient is in no acute distress.  Skin: Skin is warm and dry. No rash noted.   Cardiovascular: Normal heart rate noted  Respiratory: Normal respiratory effort, no problems with respiration noted  Abdomen: Soft, gravid, appropriate for gestational age. Pain/Pressure: Present     Pelvic: Vag. 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  Edema: Trace  Mental Status: Normal mood and affect. Normal behavior. Normal judgment and thought content.   Urinalysis: Urine Protein: Negative Urine Glucose: Negative  Assessment and Plan:  Pregnancy: [id number] at [redacted]w[redacted]d  1. Supervision of high risk pregnancy, antepartum, second trimester FHT normal.  2. 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  Will repeat in 2 weeks.  3. Need for Tdap vaccination  - Tdap (BOOSTRIX) injection 0.5 mL; Inject 0.5 mLs into the muscle once.  4. History of preterm delivery, currently pregnant, third trimester Continue 17-P.  Discussed importance of getting every  week, especially as she has missed a few weeks.  5. Previous cesarean delivery affecting pregnancy, antepartum   Preterm labor symptoms and general obstetric precautions including but not limited to vaginal bleeding, contractions, leaking of fluid and fetal movement were reviewed in detail with the patient. Please refer to After Visit Summary for other counseling recommendations.  Return in about 2 weeks (around 05/18/2015) for OB and wart treatment with TCA.   Levie Heritage, DO

## 2015-05-04 NOTE — Progress Notes (Signed)
Breastfeeding tip of the week reviewed. Patient declines 1 hr gtt and labs this week, will do at next visit TDap today

## 2015-05-11 ENCOUNTER — Inpatient Hospital Stay (HOSPITAL_COMMUNITY)
Admission: AD | Admit: 2015-05-11 | Discharge: 2015-05-11 | Disposition: A | Discharge status: Home / Self Care | Payer: Medicaid Other | Source: Ambulatory Visit | Attending: Family Medicine | Admitting: Family Medicine

## 2015-05-11 ENCOUNTER — Ambulatory Visit: Payer: Self-pay

## 2015-05-11 ENCOUNTER — Encounter (HOSPITAL_COMMUNITY): Payer: Self-pay | Admitting: *Deleted

## 2015-05-11 DIAGNOSIS — O09893 Supervision of other high risk pregnancies, third trimester: Secondary | ICD-10-CM

## 2015-05-11 DIAGNOSIS — Z3A29 29 weeks gestation of pregnancy: Secondary | ICD-10-CM | POA: Diagnosis not present

## 2015-05-11 DIAGNOSIS — Z87891 Personal history of nicotine dependence: Secondary | ICD-10-CM | POA: Insufficient documentation

## 2015-05-11 DIAGNOSIS — O26899 Other specified pregnancy related conditions, unspecified trimester: Secondary | ICD-10-CM

## 2015-05-11 DIAGNOSIS — O4703 False labor before 37 completed weeks of gestation, third trimester: Secondary | ICD-10-CM | POA: Diagnosis not present

## 2015-05-11 DIAGNOSIS — O09213 Supervision of pregnancy with history of pre-term labor, third trimester: Secondary | ICD-10-CM

## 2015-05-11 DIAGNOSIS — O34219 Maternal care for unspecified type scar from previous cesarean delivery: Secondary | ICD-10-CM

## 2015-05-11 DIAGNOSIS — R109 Unspecified abdominal pain: Secondary | ICD-10-CM

## 2015-05-11 LAB — URINALYSIS, ROUTINE W REFLEX MICROSCOPIC
Bilirubin Urine: NEGATIVE
GLUCOSE, UA: NEGATIVE mg/dL
Hgb urine dipstick: NEGATIVE
Ketones, ur: 15 mg/dL — AB
LEUKOCYTES UA: NEGATIVE
Nitrite: NEGATIVE
PH: 6 (ref 5.0–8.0)
Protein, ur: NEGATIVE mg/dL
Specific Gravity, Urine: 1.025 (ref 1.005–1.030)
Urobilinogen, UA: 0.2 mg/dL (ref 0.0–1.0)

## 2015-05-11 MED ORDER — NIFEDIPINE 10 MG PO CAPS
10.0000 mg | ORAL_CAPSULE | Freq: Once | ORAL | Status: DC
  Filled 2015-05-11: qty 1

## 2015-05-11 MED ORDER — LACTATED RINGERS IV BOLUS (SEPSIS)
1000.0000 mL | Freq: Once | INTRAVENOUS | Status: AC
  Administered 2015-05-11: 1000 mL via INTRAVENOUS

## 2015-05-11 NOTE — MAU Note (Signed)
Pt presents to MAU with complaints of contractions with some mucous discharge. Denies any vaginal bleeding or LOF

## 2015-05-11 NOTE — MAU Note (Addendum)
Pt. States she started to contract 4 days ago. Pt. Noticed blood tinged mucous after wiping this am. Denies any blood since then. Pt. States she also has a lot of pressure for the past 4 days in her vagina. Baby is moving well. Denies LOF. Next appointment is scheduled Oct 13. Was last seen last Thursday September 29. Here for evaluation. Missed 17 p injection scheduled for today.

## 2015-05-11 NOTE — MAU Provider Note (Signed)
History     CSN: [id number]  Arrival date and time: 05/11/15 1306   First Provider Initiated Contact with Patient 05/11/15 1340      Chief Complaint  Patient presents with  . Contractions   HPI [name] is a 29 y.o. [phone] at [redacted]w[redacted]d who presents to MAU today with complaint of contractions and mucus discharge. The patient states that she has had irregular contractions and increased pelvic pressure x 4 days. She denies any increased in severity or frequency of contractions. She noted a small amount of mucus discharge with scant blood tinge this morning. She states last intercourse was 2 days ago. She denies LOF. She reports good fetal movement. She does have a history of PTD x 3.   OB History    Gravida Para Term Preterm AB TAB SAB Ectopic Multiple Living   8 3 0 0  3      Past Medical History  Diagnosis Date  . Anxiety   . Depression   . Genital warts   . Anxiety 11/21/2011  . Anemia   . Complication of anesthesia     Swelling from some drug used for anesthesia. Does not  know name.  . Genital warts   . HPV test positive   . RhD negative   . Preterm labor     Past Surgical History  Procedure Laterality Date  . Cesarean section    . Dilation and curettage of uterus    . Fracture surgery Right     pinky finger  . Fractured finger  2001    open reduction  . Therapeutic abortion    . Cesarean section  2007, 2009, 2010    x3    Family History  Problem Relation Age of Onset  . Heart disease Father   . Hyperlipidemia Father     Social History  Substance Use Topics  . Smoking status: Former Smoker -- 1.00 packs/day for 8 years    Types: Cigarettes    Quit date: 02/21/2014  . Smokeless tobacco: Never Used  . Alcohol Use: No     Comment: socially    Allergies:  Allergies  Allergen Reactions  . Other Anaphylaxis    Childhood reaction, finger surgery, total body swelling to anesthesia  medication  . Latex Itching and Swelling  . Latex  Swelling and Other (See Comments)    redness    Facility-administered medications prior to admission  Medication Dose Route Frequency Provider Last Rate Last Dose  . hydroxyprogesterone caproate (DELALUTIN) 250 mg/mL injection 250 mg  250 mg Intramuscular Weekly Tereso Newcomer, MD   250 mg at 05/04/15 1115   Prescriptions prior to admission  Medication Sig Dispense Refill Last Dose  . acetaminophen (TYLENOL) 325 MG tablet Take 650 mg by mouth every 6 (six) hours as needed for moderate pain.   Past Month at Unknown time  . calcium carbonate (TUMS - DOSED IN MG ELEMENTAL CALCIUM) 500 MG chewable tablet Chew 1 tablet by mouth daily as needed for indigestion or heartburn.   Past Month at Unknown time  . Prenatal Vit-Fe Fumarate-FA (PRENATAL MULTIVITAMIN) TABS tablet Take 1 tablet by mouth daily.   05/10/2015 at Unknown time    Review of Systems  Constitutional: Negative for fever and malaise/fatigue.  Gastrointestinal: Negative for abdominal pain.  Genitourinary: Negative for dysuria, urgency and frequency.       Neg - vaginal bleeding, LOF + mucus discharge   Physical Exam  Blood pressure 103/61, pulse 102, temperature 98.4 F (36.9 C), resp. rate 18, last menstrual period 10/16/2014.  Physical Exam  Nursing note and vitals reviewed. Constitutional: She is oriented to person, place, and time. She appears well-developed and well-nourished. No distress.  HENT:  Head: Normocephalic and atraumatic.  Cardiovascular: Normal rate.   Respiratory: Effort normal.  GI: Soft. She exhibits no distension and no mass. There is no tenderness. There is no rebound and no guarding.  Neurological: She is alert and oriented to person, place, and time.  Skin: Skin is warm and dry. No erythema.  Psychiatric: She has a normal mood and affect.  Dilation: Closed Effacement (%): Thick Cervical Position: Posterior Exam by:: Vonzella Nipple PA   Results for orders placed or performed during the hospital  encounter of 05/11/15 (from the past 24 hour(s))  Urinalysis, Routine w reflex microscopic (not at [facility])     Status: Abnormal   Collection Time: 05/11/15  1:06 PM  Result Value Ref Range   Color, Urine YELLOW YELLOW   APPearance HAZY (A) CLEAR   Specific Gravity, Urine 1.025 1.005 - 1.030   pH 6.0 5.0 - 8.0   Glucose, UA NEGATIVE NEGATIVE mg/dL   Hgb urine dipstick NEGATIVE NEGATIVE   Bilirubin Urine NEGATIVE NEGATIVE   Ketones, ur 15 (A) NEGATIVE mg/dL   Protein, ur NEGATIVE NEGATIVE mg/dL   Urobilinogen, UA 0.2 0.0 - 1.0 mg/dL   Nitrite NEGATIVE NEGATIVE   Leukocytes, UA NEGATIVE NEGATIVE    Fetal Monitoring: Baseline: 150 bpm, moderate variability, + accelerations, no decelerations Contractions: Moderate UI with occasional, mild, irregular contraction  MAU Course  Procedures None  MDM UA today without evidence of infection. Mild dehydration noted EFM shows moderate UI without regular contractions FFN collected prior to exam. Will be discarded as cervix is closed, thick and posterior.  10 mg Procardia PO ordered. 1 liter IV LR bolus while in MAU.  Patient declines Procardia.  Discussed with Dr. Adrian Blackwater. He agrees with plan for discharge at this time and follow-up with [facility] today for 17-P Assessment and Plan  A: [facility] at [redacted]w[redacted]d History of preterm delivery x 3 Preterm contractions  P: Discharge home Tylenol PRN for pain advised Increased PO hydration advised Preterm labor precautions discussed Patient advised to follow-up with [facility] as planned for routine prenatal care. Patient will go directly to [facility] for 17-p injection after discharge from MAU today.  Patient may return to MAU as needed or if her condition were to change or worsen   Marny Lowenstein, PA-C  05/11/2015, 1:40 PM

## 2015-05-11 NOTE — MAU Note (Signed)
Pt. Refusing procardia due to BP and dizziness side effects. Vonzella Nipple, PA aware.

## 2015-05-11 NOTE — Discharge Instructions (Signed)
Braxton Hicks Contractions °Contractions of the uterus can occur throughout pregnancy. Contractions are not always a sign that you are in labor.  °WHAT ARE BRAXTON HICKS CONTRACTIONS?  °Contractions that occur before labor are called Braxton Hicks contractions, or false labor. Toward the end of pregnancy (32-34 weeks), these contractions can develop more often and may become more forceful. This is not true labor because these contractions do not result in opening (dilatation) and thinning of the cervix. They are sometimes difficult to tell apart from true labor because these contractions can be forceful and people have different pain tolerances. You should not feel embarrassed if you go to the hospital with false labor. Sometimes, the only way to tell if you are in true labor is for your health care provider to look for changes in the cervix. °If there are no prenatal problems or other health problems associated with the pregnancy, it is completely safe to be sent home with false labor and await the onset of true labor. °HOW CAN YOU TELL THE DIFFERENCE BETWEEN TRUE AND FALSE LABOR? °False Labor °· The contractions of false labor are usually shorter and not as hard as those of true labor.   °· The contractions are usually irregular.   °· The contractions are often felt in the front of the lower abdomen and in the groin.   °· The contractions may go away when you walk around or change positions while lying down.   °· The contractions get weaker and are shorter lasting as time goes on.   °· The contractions do not usually become progressively stronger, regular, and closer together as with true labor.   °True Labor °· Contractions in true labor last 30-70 seconds, become very regular, usually become more intense, and increase in frequency.   °· The contractions do not go away with walking.   °· The discomfort is usually felt in the top of the uterus and spreads to the lower abdomen and low back.   °· True labor can be  determined by your health care provider with an exam. This will show that the cervix is dilating and getting thinner.   °WHAT TO REMEMBER °· Keep up with your usual exercises and follow other instructions given by your health care provider.   °· Take medicines as directed by your health care provider.   °· Keep your regular prenatal appointments.   °· Eat and drink lightly if you think you are going into labor.   °· If Braxton Hicks contractions are making you uncomfortable:   °¨ Change your position from lying down or resting to walking, or from walking to resting.   °¨ Sit and rest in a tub of warm water.   °¨ Drink 2-3 glasses of water. Dehydration may cause these contractions.   °¨ Do slow and deep breathing several times an hour.   °WHEN SHOULD I SEEK IMMEDIATE MEDICAL CARE? °Seek immediate medical care if: °· Your contractions become stronger, more regular, and closer together.   °· You have fluid leaking or gushing from your vagina.   °· You have a fever.   °· You pass blood-tinged mucus.   °· You have vaginal bleeding.   °· You have continuous abdominal pain.   °· You have low back pain that you never had before.   °· You feel your baby's head pushing down and causing pelvic pressure.   °· Your baby is not moving as much as it used to.   °  °This information is not intended to replace advice given to you by your health care provider. Make sure you discuss any questions you have with your health care   provider. °  °Document Released: 07/22/2005 Document Revised: 07/27/2013 Document Reviewed: 05/03/2013 °Elsevier Interactive Patient Education ©2016 [employer] ° °

## 2015-05-16 ENCOUNTER — Encounter (HOSPITAL_COMMUNITY): Payer: Self-pay | Admitting: *Deleted

## 2015-05-16 ENCOUNTER — Inpatient Hospital Stay (HOSPITAL_COMMUNITY)
Admission: AD | Admit: 2015-05-16 | Discharge: 2015-05-16 | Disposition: A | Discharge status: Home / Self Care | Payer: Medicaid Other | Source: Ambulatory Visit | Attending: Family Medicine | Admitting: Family Medicine

## 2015-05-16 ENCOUNTER — Telehealth: Payer: Self-pay | Admitting: *Deleted

## 2015-05-16 DIAGNOSIS — O99333 Smoking (tobacco) complicating pregnancy, third trimester: Secondary | ICD-10-CM | POA: Insufficient documentation

## 2015-05-16 DIAGNOSIS — O4703 False labor before 37 completed weeks of gestation, third trimester: Secondary | ICD-10-CM

## 2015-05-16 DIAGNOSIS — O26893 Other specified pregnancy related conditions, third trimester: Secondary | ICD-10-CM

## 2015-05-16 DIAGNOSIS — N898 Other specified noninflammatory disorders of vagina: Secondary | ICD-10-CM | POA: Insufficient documentation

## 2015-05-16 DIAGNOSIS — O34219 Maternal care for unspecified type scar from previous cesarean delivery: Secondary | ICD-10-CM | POA: Diagnosis not present

## 2015-05-16 DIAGNOSIS — Z3A30 30 weeks gestation of pregnancy: Secondary | ICD-10-CM | POA: Diagnosis not present

## 2015-05-16 DIAGNOSIS — F17210 Nicotine dependence, cigarettes, uncomplicated: Secondary | ICD-10-CM | POA: Diagnosis not present

## 2015-05-16 LAB — URINALYSIS, ROUTINE W REFLEX MICROSCOPIC
Bilirubin Urine: NEGATIVE
Glucose, UA: NEGATIVE mg/dL
Ketones, ur: NEGATIVE mg/dL
LEUKOCYTES UA: NEGATIVE
NITRITE: NEGATIVE
Protein, ur: NEGATIVE mg/dL
SPECIFIC GRAVITY, URINE: 1.025 (ref 1.005–1.030)
UROBILINOGEN UA: 0.2 mg/dL (ref 0.0–1.0)
pH: 6.5 (ref 5.0–8.0)

## 2015-05-16 LAB — POCT FERN TEST: POCT Fern Test: NEGATIVE

## 2015-05-16 LAB — URINE MICROSCOPIC-ADD ON

## 2015-05-16 LAB — WET PREP, GENITAL
Clue Cells Wet Prep HPF POC: NONE SEEN
Trich, Wet Prep: NONE SEEN
YEAST WET PREP: NONE SEEN

## 2015-05-16 NOTE — MAU Note (Signed)
States she feels pressure like she wants to push and tightness. States when she was sitting on the toilet, she thought she felt some fluid come out of the vagina. Only that one time. Passes some thick mucous.

## 2015-05-16 NOTE — Discharge Instructions (Signed)
Braxton Hicks Contractions °Contractions of the uterus can occur throughout pregnancy. Contractions are not always a sign that you are in labor.  °WHAT ARE BRAXTON HICKS CONTRACTIONS?  °Contractions that occur before labor are called Braxton Hicks contractions, or false labor. Toward the end of pregnancy (32-34 weeks), these contractions can develop more often and may become more forceful. This is not true labor because these contractions do not result in opening (dilatation) and thinning of the cervix. They are sometimes difficult to tell apart from true labor because these contractions can be forceful and people have different pain tolerances. You should not feel embarrassed if you go to the hospital with false labor. Sometimes, the only way to tell if you are in true labor is for your health care provider to look for changes in the cervix. °If there are no prenatal problems or other health problems associated with the pregnancy, it is completely safe to be sent home with false labor and await the onset of true labor. °HOW CAN YOU TELL THE DIFFERENCE BETWEEN TRUE AND FALSE LABOR? °False Labor °· The contractions of false labor are usually shorter and not as hard as those of true labor.   °· The contractions are usually irregular.   °· The contractions are often felt in the front of the lower abdomen and in the groin.   °· The contractions may go away when you walk around or change positions while lying down.   °· The contractions get weaker and are shorter lasting as time goes on.   °· The contractions do not usually become progressively stronger, regular, and closer together as with true labor.   °True Labor °· Contractions in true labor last 30-70 seconds, become very regular, usually become more intense, and increase in frequency.   °· The contractions do not go away with walking.   °· The discomfort is usually felt in the top of the uterus and spreads to the lower abdomen and low back.   °· True labor can be  determined by your health care provider with an exam. This will show that the cervix is dilating and getting thinner.   °WHAT TO REMEMBER °· Keep up with your usual exercises and follow other instructions given by your health care provider.   °· Take medicines as directed by your health care provider.   °· Keep your regular prenatal appointments.   °· Eat and drink lightly if you think you are going into labor.   °· If Braxton Hicks contractions are making you uncomfortable:   °¨ Change your position from lying down or resting to walking, or from walking to resting.   °¨ Sit and rest in a tub of warm water.   °¨ Drink 2-3 glasses of water. Dehydration may cause these contractions.   °¨ Do slow and deep breathing several times an hour.   °WHEN SHOULD I SEEK IMMEDIATE MEDICAL CARE? °Seek immediate medical care if: °· Your contractions become stronger, more regular, and closer together.   °· You have fluid leaking or gushing from your vagina.   °· You have a fever.   °· You pass blood-tinged mucus.   °· You have vaginal bleeding.   °· You have continuous abdominal pain.   °· You have low back pain that you never had before.   °· You feel your baby's head pushing down and causing pelvic pressure.   °· Your baby is not moving as much as it used to.   °  °This information is not intended to replace advice given to you by your health care provider. Make sure you discuss any questions you have with your health care   provider. °  °Document Released: 07/22/2005 Document Revised: 07/27/2013 Document Reviewed: 05/03/2013 °Elsevier Interactive Patient Education ©2016 [employer] ° °

## 2015-05-16 NOTE — MAU Provider Note (Signed)
Chief Complaint:  Vaginal Discharge   First Provider Initiated Contact with Patient 05/16/15 1418      HPI: [name] is a 29 y.o. [id number] at [redacted]w[redacted]d who presents to maternity admissions reporting worseneing contractions, perineal pressure and passing brown mucus mixed w/ a little watery discharge today x 1. Has Hx PTD x 3 and C/S x 3. No previa. No bleeding this pregnancy. On 17-P  Location: low abd Quality: pressure Severity: 7/10 in pain scale Duration: 1 week, worsening over past day Context: none Timing: intermittent Modifying factors: none (hasn't tried anything) Associated signs and symptoms: Pos for vaginal discharge, urinary frequency, and pelvic pressure. Neg for fever, chills, frank bleeding, GI complaints.   Good fetal movement.   Past Medical History: Past Medical History  Diagnosis Date  . Anxiety   . Depression   . Genital warts   . Anxiety 11/21/2011  . Anemia   . Complication of anesthesia     Swelling from some drug used for anesthesia. Does not  know name.  . Genital warts   . HPV test positive   . RhD negative   . Preterm labor     Past obstetric history: OB History  Gravida Para Term Preterm AB SAB TAB Ectopic Multiple Living  8 3 0 0  3    # Outcome Date GA Lbr Len/2nd Weight Sex Delivery Anes PTL Lv  8 Current           7 Preterm 05/06/09 [redacted]w[redacted]d  6 lb 7 oz (2.92 kg) M [place] Y  6 Preterm 08/24/07 [redacted]w[redacted]d  6 lb 12 oz (3.062 kg) F CS-LTranv  Y Y  5 Preterm 01/18/06 [redacted]w[redacted]d  6 lb 9 oz (2.977 kg) F CS-LTranv EPI Y Y     Complications: Fetal Intolerance  4 SAB           3 SAB           2 SAB           1 TAB               Past Surgical History: Past Surgical History  Procedure Laterality Date  . Cesarean section    . Dilation and curettage of uterus    . Fracture surgery Right     pinky finger  . Fractured finger  2001    open reduction  . Therapeutic abortion    . Cesarean section  2007, 2009, 2010    x3     Family  History: Family History  Problem Relation Age of Onset  . Heart disease Father   . Hyperlipidemia Father     Social History: Social History  Substance Use Topics  . Smoking status: Current Some Day Smoker -- 1.00 packs/day for 8 years    Types: Cigarettes    Last Attempt to Quit: 02/21/2014  . Smokeless tobacco: Never Used  . Alcohol Use: No     Comment: socially    Allergies:  Allergies  Allergen Reactions  . Other Anaphylaxis    Childhood reaction, finger surgery, total body swelling to anesthesia  medication  . Latex Itching and Swelling  . Latex Swelling and Other (See Comments)    redness    Meds:  Facility-administered medications prior to admission  Medication Dose Route Frequency Provider Last Rate Last Dose  . hydroxyprogesterone caproate (DELALUTIN) 250 mg/mL injection 250 mg  250 mg Intramuscular Weekly Tereso Newcomer, MD   250  mg at 05/04/15 1115   Prescriptions prior to admission  Medication Sig Dispense Refill Last Dose  . calcium carbonate (TUMS - DOSED IN MG ELEMENTAL CALCIUM) 500 MG chewable tablet Chew 2 tablets by mouth daily as needed for indigestion or heartburn.    Past Week at Unknown time  . Prenatal Vit-Fe Fumarate-FA (PRENATAL MULTIVITAMIN) TABS tablet Take 1 tablet by mouth daily.   05/15/2015 at Unknown time    I have reviewed patient's Past Medical Hx, Surgical Hx, Family Hx, Social Hx, medications and allergies.   ROS:  Review of Systems  Constitutional: Negative for fever and chills.  Gastrointestinal: Positive for abdominal pain. Negative for nausea, vomiting, diarrhea and constipation.  Genitourinary: Positive for frequency, vaginal bleeding (Brown-tinged mucus), vaginal discharge and pelvic pain. Negative for dysuria, urgency, hematuria, flank pain and vaginal pain.  Musculoskeletal: Negative for back pain.    Physical Exam   Patient Vitals for the past 24 hrs:  BP Temp Temp src Pulse Resp  05/16/15 1350 97/57 mmHg 97.6 F (36.4  C) Oral 90 18   Constitutional: Well-developed, well-nourished female in mild distress.  Cardiovascular: normal rate Respiratory: normal effort GI: Abd soft, non-tender, gravid appropriate for gestational age. MS: Extremities nontender, no edema, normal ROM Neurologic: Alert and oriented x 4.  GU: Neg CVAT.  Pelvic: NEFG except for genital warts and posterior fourchette, moderate amount of brown-tinged mucus. Negative pooling. No frank blood, cervix has cyst at 10:00. No CMT  Dilation: Closed Effacement (%): Thick Exam by:: Ivonne Andrew, CNM Cervix long and closed, posterior, medium consistency  FHT:  Baseline 145 , moderate variability, accelerations present, no decelerations Contractions: q 8-12 mins, w/ UI   Labs: Results for orders placed or performed during the hospital encounter of 05/16/15 (from the past 24 hour(s))  Urinalysis, Routine w reflex microscopic (not at [facility])     Status: Abnormal   Collection Time: 05/16/15  1:37 PM  Result Value Ref Range   Color, Urine YELLOW YELLOW   APPearance HAZY (A) CLEAR   Specific Gravity, Urine 1.025 1.005 - 1.030   pH 6.5 5.0 - 8.0   Glucose, UA NEGATIVE NEGATIVE mg/dL   Hgb urine dipstick MODERATE (A) NEGATIVE   Bilirubin Urine NEGATIVE NEGATIVE   Ketones, ur NEGATIVE NEGATIVE mg/dL   Protein, ur NEGATIVE NEGATIVE mg/dL   Urobilinogen, UA 0.2 0.0 - 1.0 mg/dL   Nitrite NEGATIVE NEGATIVE   Leukocytes, UA NEGATIVE NEGATIVE  Urine microscopic-add on     Status: Abnormal   Collection Time: 05/16/15  1:37 PM  Result Value Ref Range   Squamous Epithelial / LPF MANY (A) RARE   WBC, UA 0-2 <3 WBC/hpf   RBC / HPF 0-2 <3 RBC/hpf  Wet prep, genital     Status: Abnormal   Collection Time: 05/16/15  2:26 PM  Result Value Ref Range   Yeast Wet Prep HPF POC NONE SEEN NONE SEEN   Trich, Wet Prep NONE SEEN NONE SEEN   Clue Cells Wet Prep HPF POC NONE SEEN NONE SEEN   WBC, Wet Prep HPF POC FEW (A) NONE SEEN  POCT fern test     Status: None    Collection Time: 05/16/15  3:08 PM  Result Value Ref Range   POCT Fern Test Negative = intact amniotic membranes     Imaging:  No results found.  MAU Course: UA, Wet prep, Fern slide, LR bolus, fFN collected , but discarded due to cervical exam. Declined procardia.  Contractions much better.  MDM: 29 year-old female at 30.[redacted] weeks gestation and worsening preterm contractions, but no cervical change. Discharge C/W passing mucus plug tinged w/ old blood. No emergent condition evident. No concern for uterine rupture or PROM.   Assessment: 1. Preterm contractions, third trimester   2. Vaginal discharge during pregnancy in third trimester   3. Previous cesarean delivery affecting pregnancy, antepartum    Plan: Discharge home in stable condition.  Preterm labor precautions and fetal kick counts. Increase fluids and rest. Pelvic rest x 1 week.     Follow-up Information    Follow up with [facility] On 05/18/2015.   Specialty:  Obstetrics and Gynecology   Why:  Routine prenatal visit   Contact information:   [address] [place] [address] [phone]      Follow up with [facility].   Why:  As needed in emergencies   Contact information:   [address] [id number] mc [place] [address] [phone]        Medication List    TAKE these medications        calcium carbonate 500 MG chewable tablet  Commonly known as:  TUMS - dosed in mg elemental calcium  Chew 2 tablets by mouth daily as needed for indigestion or heartburn.     prenatal multivitamin Tabs tablet  Take 1 tablet by mouth daily.        [place], CNM 05/16/2015 3:46 PM

## 2015-05-16 NOTE — MAU Note (Signed)
Urine sent to lab .

## 2015-05-16 NOTE — Telephone Encounter (Signed)
Pt left message today @ 1203 stating that she is having a lot of pressure and maybe some contractions. She also passed some mucous which was partly dark brown. She wants to know if she needs to go to the hospital. Per chart review, pt is currently @ MAU for evaluation of these complaints.

## 2015-05-18 ENCOUNTER — Encounter: Payer: Self-pay | Admitting: Obstetrics & Gynecology

## 2015-05-18 ENCOUNTER — Ambulatory Visit (INDEPENDENT_AMBULATORY_CARE_PROVIDER_SITE_OTHER): Payer: Medicaid Other | Admitting: Obstetrics & Gynecology

## 2015-05-18 VITALS — BP 94/50 | HR 79 | Temp 98.4°F | Wt 132.2 lb

## 2015-05-18 DIAGNOSIS — O34219 Maternal care for unspecified type scar from previous cesarean delivery: Secondary | ICD-10-CM | POA: Diagnosis not present

## 2015-05-18 DIAGNOSIS — O0993 Supervision of high risk pregnancy, unspecified, third trimester: Secondary | ICD-10-CM

## 2015-05-18 LAB — POCT URINALYSIS DIP (DEVICE)
Bilirubin Urine: NEGATIVE
GLUCOSE, UA: NEGATIVE mg/dL
Hgb urine dipstick: NEGATIVE
KETONES UR: NEGATIVE mg/dL
LEUKOCYTES UA: NEGATIVE
Nitrite: NEGATIVE
Protein, ur: NEGATIVE mg/dL
Specific Gravity, Urine: 1.02 (ref 1.005–1.030)
Urobilinogen, UA: 0.2 mg/dL (ref 0.0–1.0)
pH: 6.5 (ref 5.0–8.0)

## 2015-05-18 LAB — CULTURE, OB URINE
Culture: >=100000
Special Requests: NORMAL

## 2015-05-18 NOTE — Progress Notes (Signed)
States she feels like she has lost her mucous plug, and having lots of contractions- more at night. Last night c/o sharp pain in her lower pelvis- states felt like she had to push and denied constipation. Declines 17p this week.  Medicaid home form completed.

## 2015-05-18 NOTE — Progress Notes (Signed)
Subjective:  [name] is a 29 y.o. [phone] at 2261w4d being seen today for ongoing prenatal care.  Patient reports occasional contractions.  Contractions: Irregular.  Vag. Bleeding: None. Movement: Present. Denies leaking of fluid.  Mucus d/c The following portions of the patient's history were reviewed and updated as appropriate: allergies, current medications, past family history, past medical history, past social history, past surgical history and problem list. Problem list updated.  Objective:   Filed Vitals:   05/18/15 1125  BP: 94/50  Pulse: 79  Temp: 98.4 F (36.9 C)  Weight: 132 lb 3.2 oz (59.966 kg)    Fetal Status: Fetal Heart Rate (bpm): 134   Movement: Present     General:  Alert, oriented and cooperative. Patient is in no acute distress.  Skin: Skin is warm and dry. No rash noted.   Cardiovascular: Normal heart rate noted  Respiratory: Normal respiratory effort, no problems with respiration noted  Abdomen: Soft, gravid, appropriate for gestational age. Pain/Pressure: Present     Pelvic: Vag. Bleeding: None Vag D/C Character: Mucous   Cervical exam performed        Extremities: Normal range of motion.  Edema: Trace  Mental Status: Normal mood and affect. Normal behavior. Normal judgment and thought content.   Urinalysis: Urine Protein: Negative Urine Glucose: Negative  Assessment and Plan:  Pregnancy: [id number] at 4061w4d  1. Supervision of high risk pregnancy, antepartum, third trimester 3 prior c/s, no PT births  2. Previous cesarean delivery affecting pregnancy, antepartum Repeat at 39 weeks  Preterm labor symptoms and general obstetric precautions including but not limited to vaginal bleeding, contractions, leaking of fluid and fetal movement were reviewed in detail with the patient. Please refer to After Visit Summary for other counseling recommendations.  Return in about 2 weeks (around 06/01/2015).   Kristina PhenixJames G Koa Zoeller, MD

## 2015-05-18 NOTE — Patient Instructions (Signed)

## 2015-05-19 ENCOUNTER — Encounter: Payer: Self-pay | Admitting: *Deleted

## 2015-05-25 ENCOUNTER — Ambulatory Visit: Payer: Self-pay

## 2015-05-25 ENCOUNTER — Telehealth: Payer: Self-pay | Admitting: Obstetrics & Gynecology

## 2015-05-25 NOTE — Telephone Encounter (Signed)
Called patient to inform her of missed appointment. Left message for her to call us back.

## 2015-06-01 ENCOUNTER — Encounter: Payer: Self-pay | Admitting: *Deleted

## 2015-06-01 ENCOUNTER — Ambulatory Visit (INDEPENDENT_AMBULATORY_CARE_PROVIDER_SITE_OTHER): Payer: Medicaid Other | Admitting: Family Medicine

## 2015-06-01 VITALS — BP 113/60 | HR 97 | Temp 98.6°F | Wt 140.5 lb

## 2015-06-01 DIAGNOSIS — A630 Anogenital (venereal) warts: Secondary | ICD-10-CM

## 2015-06-01 DIAGNOSIS — O34219 Maternal care for unspecified type scar from previous cesarean delivery: Secondary | ICD-10-CM

## 2015-06-01 DIAGNOSIS — O0993 Supervision of high risk pregnancy, unspecified, third trimester: Secondary | ICD-10-CM

## 2015-06-01 DIAGNOSIS — O9A313 Physical abuse complicating pregnancy, third trimester: Secondary | ICD-10-CM

## 2015-06-01 DIAGNOSIS — F17200 Nicotine dependence, unspecified, uncomplicated: Secondary | ICD-10-CM

## 2015-06-01 DIAGNOSIS — IMO0002 Reserved for concepts with insufficient information to code with codable children: Secondary | ICD-10-CM

## 2015-06-01 NOTE — Patient Instructions (Addendum)
Hydrocortisone cream for the rash on belly  Tylenol for back pain  Unisom for sleep  Safe Medications in Pregnancy   Acne: Benzoyl Peroxide Salicylic Acid  Backache/Headache: Tylenol: 2 regular strength every 4 hours OR              2 Extra strength every 6 hours  Colds/Coughs/Allergies: Benadryl (alcohol free) 25 mg every 6 hours as needed Breath right strips Claritin Cepacol throat lozenges Chloraseptic throat spray Cold-Eeze- up to three times per day Cough drops, alcohol free Flonase (by prescription only) Guaifenesin Mucinex Robitussin DM (plain only, alcohol free) Saline nasal spray/drops Sudafed (pseudoephedrine) & Actifed ** use only after [redacted] weeks gestation and if you do not have high blood pressure Tylenol Vicks Vaporub Zinc lozenges Zyrtec   Constipation: Colace Ducolax suppositories Fleet enema Glycerin suppositories Metamucil Milk of magnesia Miralax Senokot Smooth move tea  Diarrhea: Kaopectate Imodium A-D  *NO pepto Bismol  Hemorrhoids: Anusol Anusol HC Preparation H Tucks  Indigestion: Tums Maalox Mylanta Zantac  Pepcid  Insomnia: Benadryl (alcohol free) 25mg  every 6 hours as needed Tylenol PM Unisom, no Gelcaps  Leg Cramps: Tums Magnesium  Nausea/Vomiting:  Bonine Dramamine Emetrol Ginger extract Sea bands Meclizine   Nausea medication to take during pregnancy:  Unisom (doxylamine succinate 25 mg tablets) Take one tablet daily at bedtime. If symptoms are not adequately controlled, the dose can be increased to a maximum recommended dose of two tablets daily (1/2 tablet in the morning, 1/2 tablet mid-afternoon and one at bedtime). Vitamin B6 100mg  tablets. Take one tablet twice a day (up to 200 mg per day).  Skin Rashes: Aveeno products Benadryl cream or 25mg  every 6 hours as needed Calamine Lotion 1% cortisone cream  Yeast infection: Gyne-lotrimin 7 Monistat 7   **If taking multiple medications, please  check labels to avoid duplicating the same active ingredients **take medication as directed on the label ** Do not exceed 4000 mg of tylenol in 24 hours **Do not take medications that contain aspirin or ibuprofen      Third Trimester of Pregnancy The third trimester is from week 29 through week 42, months 7 through 9. The third trimester is a time when the fetus is growing rapidly. At the end of the ninth month, the fetus is about 20 inches in length and weighs 6-10 pounds.  BODY CHANGES Your body goes through many changes during pregnancy. The changes vary from woman to woman.   Your weight will continue to increase. You can expect to gain 25-35 pounds (11-16 kg) by the end of the pregnancy.  You may begin to get stretch marks on your hips, abdomen, and breasts.  You may urinate more often because the fetus is moving lower into your pelvis and pressing on your bladder.  You may develop or continue to have heartburn as a result of your pregnancy.  You may develop constipation because certain hormones are causing the muscles that push waste through your intestines to slow down.  You may develop hemorrhoids or swollen, bulging veins (varicose veins).  You may have pelvic pain because of the weight gain and pregnancy hormones relaxing your joints between the bones in your pelvis. Backaches may result from overexertion of the muscles supporting your posture.  You may have changes in your hair. These can include thickening of your hair, rapid growth, and changes in texture. Some women also have hair loss during or after pregnancy, or hair that feels dry or thin. Your hair will most likely  return to normal after your baby is born.  Your breasts will continue to grow and be tender. A yellow discharge may leak from your breasts called colostrum.  Your belly button may stick out.  You may feel short of breath because of your expanding uterus.  You may notice the fetus "dropping," or moving  lower in your abdomen.  You may have a bloody mucus discharge. This usually occurs a few days to a week before labor begins.  Your cervix becomes thin and soft (effaced) near your due date. WHAT TO EXPECT AT YOUR PRENATAL EXAMS  You will have prenatal exams every 2 weeks until week 36. Then, you will have weekly prenatal exams. During a routine prenatal visit:  You will be weighed to make sure you and the fetus are growing normally.  Your blood pressure is taken.  Your abdomen will be measured to track your baby's growth.  The fetal heartbeat will be listened to.  Any test results from the previous visit will be discussed.  You may have a cervical check near your due date to see if you have effaced. At around 36 weeks, your caregiver will check your cervix. At the same time, your caregiver will also perform a test on the secretions of the vaginal tissue. This test is to determine if a type of bacteria, Group B streptococcus, is present. Your caregiver will explain this further. Your caregiver may ask you:  What your birth plan is.  How you are feeling.  If you are feeling the baby move.  If you have had any abnormal symptoms, such as leaking fluid, bleeding, severe headaches, or abdominal cramping.  If you are using any tobacco products, including cigarettes, chewing tobacco, and electronic cigarettes.  If you have any questions. Other tests or screenings that may be performed during your third trimester include:  Blood tests that check for low iron levels (anemia).  Fetal testing to check the health, activity level, and growth of the fetus. Testing is done if you have certain medical conditions or if there are problems during the pregnancy.  HIV (human immunodeficiency virus) testing. If you are at high risk, you may be screened for HIV during your third trimester of pregnancy. FALSE LABOR You may feel small, irregular contractions that eventually go away. These are called  Braxton Hicks contractions, or false labor. Contractions may last for hours, days, or even weeks before true labor sets in. If contractions come at regular intervals, intensify, or become painful, it is best to be seen by your caregiver.  SIGNS OF LABOR   Menstrual-like cramps.  Contractions that are 5 minutes apart or less.  Contractions that start on the top of the uterus and spread down to the lower abdomen and back.  A sense of increased pelvic pressure or back pain.  A watery or bloody mucus discharge that comes from the vagina. If you have any of these signs before the 37th week of pregnancy, call your caregiver right away. You need to go to the hospital to get checked immediately. HOME CARE INSTRUCTIONS   Avoid all smoking, herbs, alcohol, and unprescribed drugs. These chemicals affect the formation and growth of the baby.  Do not use any tobacco products, including cigarettes, chewing tobacco, and electronic cigarettes. If you need help quitting, ask your health care provider. You may receive counseling support and other resources to help you quit.  Follow your caregiver's instructions regarding medicine use. There are medicines that are either safe or unsafe  to take during pregnancy.  Exercise only as directed by your caregiver. Experiencing uterine cramps is a good sign to stop exercising.  Continue to eat regular, healthy meals.  Wear a good support bra for breast tenderness.  Do not use hot tubs, steam rooms, or saunas.  Wear your seat belt at all times when driving.  Avoid raw meat, uncooked cheese, cat litter boxes, and soil used by cats. These carry germs that can cause birth defects in the baby.  Take your prenatal vitamins.  Take 1500-2000 mg of calcium daily starting at the 20th week of pregnancy until you deliver your baby.  Try taking a stool softener (if your caregiver approves) if you develop constipation. Eat more high-fiber foods, such as fresh vegetables  or fruit and whole grains. Drink plenty of fluids to keep your urine clear or pale yellow.  Take warm sitz baths to soothe any pain or discomfort caused by hemorrhoids. Use hemorrhoid cream if your caregiver approves.  If you develop varicose veins, wear support hose. Elevate your feet for 15 minutes, 3-4 times a day. Limit salt in your diet.  Avoid heavy lifting, wear low heal shoes, and practice good posture.  Rest a lot with your legs elevated if you have leg cramps or low back pain.  Visit your dentist if you have not gone during your pregnancy. Use a soft toothbrush to brush your teeth and be gentle when you floss.  A sexual relationship may be continued unless your caregiver directs you otherwise.  Do not travel far distances unless it is absolutely necessary and only with the approval of your caregiver.  Take prenatal classes to understand, practice, and ask questions about the labor and delivery.  Make a trial run to the hospital.  Pack your hospital bag.  Prepare the baby's nursery.  Continue to go to all your prenatal visits as directed by your caregiver. SEEK MEDICAL CARE IF:  You are unsure if you are in labor or if your water has broken.  You have dizziness.  You have mild pelvic cramps, pelvic pressure, or nagging pain in your abdominal area.  You have persistent nausea, vomiting, or diarrhea.  You have a bad smelling vaginal discharge.  You have pain with urination. SEEK IMMEDIATE MEDICAL CARE IF:   You have a fever.  You are leaking fluid from your vagina.  You have spotting or bleeding from your vagina.  You have severe abdominal cramping or pain.  You have rapid weight loss or gain.  You have shortness of breath with chest pain.  You notice sudden or extreme swelling of your face, hands, ankles, feet, or legs.  You have not felt your baby move in over an hour.  You have severe headaches that do not go away with medicine.  You have vision  changes.   This information is not intended to replace advice given to you by your health care provider. Make sure you discuss any questions you have with your health care provider.   Document Released: 07/16/2001 Document Revised: 08/12/2014 Document Reviewed: 09/22/2012 Elsevier Interactive Patient Education [employer]
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
## 2015-06-01 NOTE — Progress Notes (Signed)
Pt thinks that baby turned from being head down.  Pt refuses to take 17 P.

## 2015-06-07 ENCOUNTER — Encounter (HOSPITAL_COMMUNITY): Payer: Self-pay | Admitting: *Deleted

## 2015-06-15 ENCOUNTER — Encounter: Payer: Medicaid Other | Admitting: Medical

## 2015-06-15 ENCOUNTER — Ambulatory Visit (INDEPENDENT_AMBULATORY_CARE_PROVIDER_SITE_OTHER): Payer: Medicaid Other | Admitting: Family Medicine

## 2015-06-15 VITALS — BP 103/63 | HR 90 | Temp 98.5°F | Wt 139.8 lb

## 2015-06-15 DIAGNOSIS — Z3483 Encounter for supervision of other normal pregnancy, third trimester: Secondary | ICD-10-CM | POA: Diagnosis present

## 2015-06-15 DIAGNOSIS — R21 Rash and other nonspecific skin eruption: Secondary | ICD-10-CM

## 2015-06-15 DIAGNOSIS — Z3493 Encounter for supervision of normal pregnancy, unspecified, third trimester: Secondary | ICD-10-CM

## 2015-06-15 LAB — POCT URINALYSIS DIP (DEVICE)
Bilirubin Urine: NEGATIVE
Glucose, UA: NEGATIVE mg/dL
HGB URINE DIPSTICK: NEGATIVE
Ketones, ur: 40 mg/dL — AB
LEUKOCYTES UA: NEGATIVE
NITRITE: NEGATIVE
PROTEIN: NEGATIVE mg/dL
SPECIFIC GRAVITY, URINE: 1.025 (ref 1.005–1.030)
UROBILINOGEN UA: 0.2 mg/dL (ref 0.0–1.0)
pH: 5.5 (ref 5.0–8.0)

## 2015-06-15 NOTE — Progress Notes (Signed)
Subjective:  [name] is a 29 y.o. [phone] at 4989w5d being seen today for ongoing prenatal care.  Patient reports contractions since four-five weeks ago, vaginal cramps, and rash. Rash is itchy and located on abdomen, legs, and arms. Rash has been present for four weeks and unchanged. Has been taking Benadryl for rash.  Contractions: Irregular.  Vag. Bleeding: None. Movement: Present. Denies leaking of fluid. Lost mucous plug 2 weeks ago.  The following portions of the patient's history were reviewed and updated as appropriate: allergies, current medications, past family history, past medical history, past social history, past surgical history and problem list. Problem list updated.  Objective:   Filed Vitals:   06/15/15 1355  BP: 103/63  Pulse: 90  Temp: 98.5 F (36.9 C)  Weight: 139 lb 12.8 oz (63.413 kg)    Fetal Status: Fetal Heart Rate (bpm): 150   Movement: Present     General:  Alert, oriented and cooperative. Patient is in no acute distress.  Skin: Skin is warm and dry. Small pink macules noted on abdomen, legs, and arms with excoriations.  Cardiovascular: Normal heart rate noted  Respiratory: Normal respiratory effort, no problems with respiration noted  Abdomen: Soft, gravid, appropriate for gestational age. Pain/Pressure: Present     Pelvic: Vag. Bleeding: None     Cervical exam performed Dilation: Closed     Cervix closed.  Extremities: Normal range of motion.  Edema: Mild pitting, slight indentation  Mental Status: Normal mood and affect. Normal behavior. Normal judgment and thought content.   Urine Protein: Negative Urine Glucose: Negative  Assessment and Plan:  Pregnancy: [id number] at 1789w5d  1. Third trimester pregnancy - Cervix closed. Continue to monitor given contractions and vaginal cramping. - Discussed if contractions become more consistent or grow in intensity to return to clinic for evaluation - Benadryl to help with sleep and itching - Follow up in 2  weeks - C-section scheduled for December 12  2. Rash- Located on abdomen and lower and upper extremities. Pruritic. - Recommend Hydrocortisone, Benadryl - Continue to monitor  Preterm labor symptoms and general obstetric precautions including but not limited to vaginal bleeding, contractions, leaking of fluid and fetal movement were reviewed in detail with the patient. Please refer to After Visit Summary for other counseling recommendations.   Araceli Bouchealeigh N Avo Schlachter, [place]

## 2015-06-15 NOTE — Progress Notes (Signed)
C/o contractions often and cramping and feeling like needs to push. Denies constipation. C/o itching all over- has a rash. States lost mucous plug 2 weeks ago.

## 2015-06-19 ENCOUNTER — Encounter (HOSPITAL_COMMUNITY): Payer: Self-pay | Admitting: *Deleted

## 2015-06-19 ENCOUNTER — Telehealth: Payer: Self-pay | Admitting: Obstetrics and Gynecology

## 2015-06-19 ENCOUNTER — Inpatient Hospital Stay (HOSPITAL_COMMUNITY)
Admission: AD | Admit: 2015-06-19 | Discharge: 2015-06-19 | Disposition: A | Discharge status: Home / Self Care | Payer: Medicaid Other | Source: Ambulatory Visit | Attending: Obstetrics & Gynecology | Admitting: Obstetrics & Gynecology

## 2015-06-19 ENCOUNTER — Encounter: Payer: Self-pay | Admitting: Obstetrics and Gynecology

## 2015-06-19 ENCOUNTER — Other Ambulatory Visit: Payer: Self-pay | Admitting: Family Medicine

## 2015-06-19 DIAGNOSIS — F17210 Nicotine dependence, cigarettes, uncomplicated: Secondary | ICD-10-CM | POA: Insufficient documentation

## 2015-06-19 DIAGNOSIS — Z3A35 35 weeks gestation of pregnancy: Secondary | ICD-10-CM | POA: Diagnosis not present

## 2015-06-19 DIAGNOSIS — O99333 Smoking (tobacco) complicating pregnancy, third trimester: Secondary | ICD-10-CM | POA: Diagnosis not present

## 2015-06-19 DIAGNOSIS — O4703 False labor before 37 completed weeks of gestation, third trimester: Secondary | ICD-10-CM | POA: Diagnosis not present

## 2015-06-19 MED ORDER — NIFEDIPINE ER OSMOTIC RELEASE 30 MG PO TB24: 30.0000 mg | ORAL_TABLET | Freq: Two times a day (BID) | ORAL | Status: DC

## 2015-06-19 MED ORDER — FENTANYL CITRATE (PF) 100 MCG/2ML IJ SOLN
50.0000 ug | INTRAMUSCULAR | Status: DC | PRN
  Administered 2015-06-19: 50 ug via INTRAVENOUS
  Filled 2015-06-19: qty 2

## 2015-06-19 MED ORDER — TERBUTALINE SULFATE 1 MG/ML IJ SOLN
0.2500 mg | Freq: Once | INTRAMUSCULAR | Status: AC
  Administered 2015-06-19: 0.25 mg via SUBCUTANEOUS
  Filled 2015-06-19: qty 1

## 2015-06-19 MED ORDER — SODIUM CHLORIDE 0.9 % IV BOLUS (SEPSIS)
500.0000 mL | Freq: Once | INTRAVENOUS | Status: AC
  Administered 2015-06-19: 500 mL via INTRAVENOUS

## 2015-06-19 NOTE — Discharge Instructions (Signed)

## 2015-06-19 NOTE — Telephone Encounter (Signed)
Called patient about missed appointment. Left message appointment has been rescheduled.

## 2015-06-19 NOTE — Progress Notes (Signed)
Patient states medication helped with cramping, but she still feels pain and pressure in pelvic and suprapubic area. Hurts worse when walking.

## 2015-06-19 NOTE — MAU Note (Addendum)
Took Castor oil last week for constipation. Had BM, but has continued to have cramping off and on since. Today was much worse. States baby not moving as much past 24 hrs.
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
## 2015-06-19 NOTE — MAU Note (Signed)
Urine in lab 

## 2015-06-26 ENCOUNTER — Ambulatory Visit (INDEPENDENT_AMBULATORY_CARE_PROVIDER_SITE_OTHER): Payer: Medicaid Other | Admitting: Obstetrics and Gynecology

## 2015-06-26 ENCOUNTER — Encounter: Payer: Medicaid Other | Admitting: Obstetrics and Gynecology

## 2015-06-26 ENCOUNTER — Encounter: Payer: Self-pay | Admitting: Obstetrics and Gynecology

## 2015-06-26 VITALS — BP 103/58 | HR 82 | Temp 98.0°F | Wt 146.3 lb

## 2015-06-26 DIAGNOSIS — B951 Streptococcus, group B, as the cause of diseases classified elsewhere: Secondary | ICD-10-CM | POA: Diagnosis not present

## 2015-06-26 DIAGNOSIS — O34219 Maternal care for unspecified type scar from previous cesarean delivery: Secondary | ICD-10-CM

## 2015-06-26 DIAGNOSIS — O09213 Supervision of pregnancy with history of pre-term labor, third trimester: Secondary | ICD-10-CM

## 2015-06-26 DIAGNOSIS — O360130 Maternal care for anti-D [Rh] antibodies, third trimester, not applicable or unspecified: Secondary | ICD-10-CM

## 2015-06-26 DIAGNOSIS — O2343 Unspecified infection of urinary tract in pregnancy, third trimester: Secondary | ICD-10-CM | POA: Diagnosis not present

## 2015-06-26 DIAGNOSIS — O0993 Supervision of high risk pregnancy, unspecified, third trimester: Secondary | ICD-10-CM

## 2015-06-26 NOTE — Progress Notes (Signed)
Pt reports going to MAU on 06/19/15 due to contractions  C/o rash on stomach with itchiness; benadryl and creams not effective

## 2015-06-26 NOTE — Addendum Note (Signed)
Addended by: Faythe CasaBELLAMY, Shanya Ferriss M on: 06/26/2015 02:18 PM   Modules accepted: Orders
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
## 2015-07-01 LAB — BILE ACIDS, TOTAL: Bile Acids Total: 12 umol/L (ref 0–19)

## 2015-07-03 ENCOUNTER — Inpatient Hospital Stay (HOSPITAL_COMMUNITY)
Admission: AD | Admit: 2015-07-03 | Discharge: 2015-07-07 | DRG: 765 | Disposition: A | Discharge status: Home / Self Care | Payer: Medicaid Other | Source: Ambulatory Visit | Attending: Family Medicine | Admitting: Family Medicine

## 2015-07-03 ENCOUNTER — Encounter (HOSPITAL_COMMUNITY): Payer: Self-pay | Admitting: Anesthesiology

## 2015-07-03 ENCOUNTER — Inpatient Hospital Stay (HOSPITAL_COMMUNITY): Payer: Medicaid Other | Admitting: Certified Registered Nurse Anesthetist

## 2015-07-03 ENCOUNTER — Encounter (HOSPITAL_COMMUNITY)
Admission: AD | Disposition: A | Discharge status: Home / Self Care | Payer: Self-pay | Source: Ambulatory Visit | Attending: Family Medicine

## 2015-07-03 ENCOUNTER — Encounter (HOSPITAL_COMMUNITY): Payer: Self-pay

## 2015-07-03 ENCOUNTER — Telehealth: Payer: Self-pay | Admitting: *Deleted

## 2015-07-03 DIAGNOSIS — F17210 Nicotine dependence, cigarettes, uncomplicated: Secondary | ICD-10-CM | POA: Diagnosis present

## 2015-07-03 DIAGNOSIS — O26899 Other specified pregnancy related conditions, unspecified trimester: Secondary | ICD-10-CM

## 2015-07-03 DIAGNOSIS — O34211 Maternal care for low transverse scar from previous cesarean delivery: Secondary | ICD-10-CM | POA: Diagnosis present

## 2015-07-03 DIAGNOSIS — O99344 Other mental disorders complicating childbirth: Secondary | ICD-10-CM | POA: Diagnosis present

## 2015-07-03 DIAGNOSIS — O9081 Anemia of the puerperium: Secondary | ICD-10-CM | POA: Diagnosis not present

## 2015-07-03 DIAGNOSIS — O26613 Liver and biliary tract disorders in pregnancy, third trimester: Secondary | ICD-10-CM

## 2015-07-03 DIAGNOSIS — O26893 Other specified pregnancy related conditions, third trimester: Secondary | ICD-10-CM | POA: Diagnosis present

## 2015-07-03 DIAGNOSIS — K831 Obstruction of bile duct: Secondary | ICD-10-CM | POA: Diagnosis present

## 2015-07-03 DIAGNOSIS — Z3A37 37 weeks gestation of pregnancy: Secondary | ICD-10-CM | POA: Diagnosis not present

## 2015-07-03 DIAGNOSIS — B951 Streptococcus, group B, as the cause of diseases classified elsewhere: Secondary | ICD-10-CM | POA: Diagnosis present

## 2015-07-03 DIAGNOSIS — O2340 Unspecified infection of urinary tract in pregnancy, unspecified trimester: Secondary | ICD-10-CM

## 2015-07-03 DIAGNOSIS — IMO0002 Reserved for concepts with insufficient information to code with codable children: Secondary | ICD-10-CM

## 2015-07-03 DIAGNOSIS — F419 Anxiety disorder, unspecified: Secondary | ICD-10-CM

## 2015-07-03 DIAGNOSIS — D62 Acute posthemorrhagic anemia: Secondary | ICD-10-CM | POA: Diagnosis not present

## 2015-07-03 DIAGNOSIS — Z6791 Unspecified blood type, Rh negative: Secondary | ICD-10-CM | POA: Diagnosis not present

## 2015-07-03 DIAGNOSIS — R109 Unspecified abdominal pain: Secondary | ICD-10-CM

## 2015-07-03 DIAGNOSIS — O2662 Liver and biliary tract disorders in childbirth: Secondary | ICD-10-CM | POA: Diagnosis present

## 2015-07-03 DIAGNOSIS — F418 Other specified anxiety disorders: Secondary | ICD-10-CM | POA: Diagnosis present

## 2015-07-03 DIAGNOSIS — O34219 Maternal care for unspecified type scar from previous cesarean delivery: Secondary | ICD-10-CM | POA: Diagnosis present

## 2015-07-03 DIAGNOSIS — K8080 Other cholelithiasis without obstruction: Secondary | ICD-10-CM | POA: Diagnosis not present

## 2015-07-03 DIAGNOSIS — O9962 Diseases of the digestive system complicating childbirth: Secondary | ICD-10-CM | POA: Diagnosis not present

## 2015-07-03 DIAGNOSIS — Z302 Encounter for sterilization: Secondary | ICD-10-CM

## 2015-07-03 DIAGNOSIS — O99824 Streptococcus B carrier state complicating childbirth: Secondary | ICD-10-CM | POA: Diagnosis present

## 2015-07-03 DIAGNOSIS — F329 Major depressive disorder, single episode, unspecified: Secondary | ICD-10-CM | POA: Diagnosis present

## 2015-07-03 DIAGNOSIS — O99334 Smoking (tobacco) complicating childbirth: Secondary | ICD-10-CM | POA: Diagnosis present

## 2015-07-03 DIAGNOSIS — D649 Anemia, unspecified: Secondary | ICD-10-CM

## 2015-07-03 DIAGNOSIS — Z98891 History of uterine scar from previous surgery: Secondary | ICD-10-CM

## 2015-07-03 DIAGNOSIS — F17200 Nicotine dependence, unspecified, uncomplicated: Secondary | ICD-10-CM | POA: Diagnosis present

## 2015-07-03 DIAGNOSIS — O0990 Supervision of high risk pregnancy, unspecified, unspecified trimester: Secondary | ICD-10-CM

## 2015-07-03 LAB — CBC
HEMATOCRIT: 33.9 % — AB (ref 36.0–46.0)
HEMOGLOBIN: 10.9 g/dL — AB (ref 12.0–15.0)
MCH: 26.7 pg (ref 26.0–34.0)
MCHC: 32.2 g/dL (ref 30.0–36.0)
MCV: 82.9 fL (ref 78.0–100.0)
Platelets: 305 10*3/uL (ref 150–400)
RBC: 4.09 MIL/uL (ref 3.87–5.11)
RDW: 15.5 % (ref 11.5–15.5)
WBC: 15.6 10*3/uL — ABNORMAL HIGH (ref 4.0–10.5)

## 2015-07-03 LAB — COMPREHENSIVE METABOLIC PANEL
ALT: 11 U/L — ABNORMAL LOW (ref 14–54)
ANION GAP: 9 (ref 5–15)
AST: 18 U/L (ref 15–41)
Albumin: 2.8 g/dL — ABNORMAL LOW (ref 3.5–5.0)
Alkaline Phosphatase: 144 U/L — ABNORMAL HIGH (ref 38–126)
BILIRUBIN TOTAL: 0.3 mg/dL (ref 0.3–1.2)
BUN: 9 mg/dL (ref 6–20)
CHLORIDE: 103 mmol/L (ref 101–111)
CO2: 23 mmol/L (ref 22–32)
Calcium: 8.7 mg/dL — ABNORMAL LOW (ref 8.9–10.3)
Creatinine, Ser: 0.5 mg/dL (ref 0.44–1.00)
Glucose, Bld: 77 mg/dL (ref 65–99)
POTASSIUM: 4.1 mmol/L (ref 3.5–5.1)
Sodium: 135 mmol/L (ref 135–145)
TOTAL PROTEIN: 6.4 g/dL — AB (ref 6.5–8.1)

## 2015-07-03 LAB — PROTIME-INR
INR: 0.89 (ref 0.00–1.49)
PROTHROMBIN TIME: 12.3 s (ref 11.6–15.2)

## 2015-07-03 LAB — RAPID HIV SCREEN (HIV 1/2 AB+AG)
HIV 1/2 ANTIBODIES: NONREACTIVE
HIV-1 P24 Antigen - HIV24: NONREACTIVE

## 2015-07-03 SURGERY — Surgical Case
Anesthesia: Spinal

## 2015-07-03 MED ORDER — DIPHENHYDRAMINE HCL 25 MG PO CAPS
25.0000 mg | ORAL_CAPSULE | ORAL | Status: DC | PRN
  Filled 2015-07-03: qty 1

## 2015-07-03 MED ORDER — OXYTOCIN 10 UNIT/ML IJ SOLN
40.0000 [IU] | INTRAMUSCULAR | Status: DC | PRN
  Administered 2015-07-03: 40 [IU] via INTRAVENOUS

## 2015-07-03 MED ORDER — MEPERIDINE HCL 25 MG/ML IJ SOLN: 6.2500 mg | INTRAMUSCULAR | Status: DC | PRN

## 2015-07-03 MED ORDER — CITRIC ACID-SODIUM CITRATE 334-500 MG/5ML PO SOLN
30.0000 mL | Freq: Once | ORAL | Status: AC
  Administered 2015-07-03: 30 mL via ORAL
  Filled 2015-07-03: qty 15

## 2015-07-03 MED ORDER — FENTANYL CITRATE (PF) 100 MCG/2ML IJ SOLN
INTRAMUSCULAR | Status: AC
  Filled 2015-07-03: qty 2

## 2015-07-03 MED ORDER — BUPIVACAINE IN DEXTROSE 0.75-8.25 % IT SOLN
INTRATHECAL | Status: DC | PRN
  Administered 2015-07-03: 8.5 mg via INTRATHECAL

## 2015-07-03 MED ORDER — HYDROMORPHONE HCL 1 MG/ML IJ SOLN: 0.2500 mg | INTRAMUSCULAR | Status: DC | PRN

## 2015-07-03 MED ORDER — OXYTOCIN 10 UNIT/ML IJ SOLN
INTRAMUSCULAR | Status: AC
  Filled 2015-07-03: qty 4

## 2015-07-03 MED ORDER — KETOROLAC TROMETHAMINE 30 MG/ML IJ SOLN: 30.0000 mg | Freq: Once | INTRAMUSCULAR | Status: DC | PRN

## 2015-07-03 MED ORDER — PROMETHAZINE HCL 25 MG/ML IJ SOLN: 6.2500 mg | INTRAMUSCULAR | Status: DC | PRN

## 2015-07-03 MED ORDER — DIPHENHYDRAMINE HCL 50 MG/ML IJ SOLN: 12.5000 mg | INTRAMUSCULAR | Status: DC | PRN

## 2015-07-03 MED ORDER — MORPHINE SULFATE (PF) 0.5 MG/ML IJ SOLN
INTRAMUSCULAR | Status: AC
  Filled 2015-07-03: qty 10

## 2015-07-03 MED ORDER — RHO D IMMUNE GLOBULIN 1500 UNIT/2ML IJ SOSY
300.0000 ug | PREFILLED_SYRINGE | Freq: Once | INTRAMUSCULAR | Status: AC
  Administered 2015-07-04: 300 ug via INTRAMUSCULAR
  Filled 2015-07-03: qty 2

## 2015-07-03 MED ORDER — KETOROLAC TROMETHAMINE 30 MG/ML IJ SOLN
30.0000 mg | Freq: Four times a day (QID) | INTRAMUSCULAR | Status: AC | PRN
  Administered 2015-07-03: 30 mg via INTRAMUSCULAR

## 2015-07-03 MED ORDER — ONDANSETRON HCL 4 MG/2ML IJ SOLN
INTRAMUSCULAR | Status: DC | PRN
  Administered 2015-07-03: 4 mg via INTRAVENOUS

## 2015-07-03 MED ORDER — PHENYLEPHRINE HCL 10 MG/ML IJ SOLN
INTRAMUSCULAR | Status: DC | PRN
  Administered 2015-07-03: 40 ug via INTRAVENOUS
  Administered 2015-07-03 (×2): 80 ug via INTRAVENOUS

## 2015-07-03 MED ORDER — PHENYLEPHRINE 40 MCG/ML (10ML) SYRINGE FOR IV PUSH (FOR BLOOD PRESSURE SUPPORT)
PREFILLED_SYRINGE | INTRAVENOUS | Status: AC
  Filled 2015-07-03: qty 10

## 2015-07-03 MED ORDER — KETAMINE HCL 10 MG/ML IJ SOLN
INTRAMUSCULAR | Status: AC
  Filled 2015-07-03: qty 1

## 2015-07-03 MED ORDER — CEFAZOLIN SODIUM-DEXTROSE 2-3 GM-% IV SOLR
2.0000 g | INTRAVENOUS | Status: AC
  Administered 2015-07-03: 2 g via INTRAVENOUS
  Filled 2015-07-03: qty 50

## 2015-07-03 MED ORDER — MORPHINE SULFATE (PF) 0.5 MG/ML IJ SOLN
INTRAMUSCULAR | Status: DC | PRN
  Administered 2015-07-03: .15 mg via INTRATHECAL

## 2015-07-03 MED ORDER — TERBUTALINE SULFATE 1 MG/ML IJ SOLN
0.2500 mg | Freq: Once | INTRAMUSCULAR | Status: AC
  Administered 2015-07-03: 0.25 mg via SUBCUTANEOUS
  Filled 2015-07-03: qty 1

## 2015-07-03 MED ORDER — PHENYLEPHRINE 8 MG IN D5W 100 ML (0.08MG/ML) PREMIX OPTIME
INJECTION | INTRAVENOUS | Status: DC | PRN
  Administered 2015-07-03: 50 ug/min via INTRAVENOUS

## 2015-07-03 MED ORDER — ONDANSETRON HCL 4 MG/2ML IJ SOLN
INTRAMUSCULAR | Status: AC
  Filled 2015-07-03: qty 2

## 2015-07-03 MED ORDER — KETAMINE HCL 10 MG/ML IJ SOLN
INTRAMUSCULAR | Status: DC | PRN
  Administered 2015-07-03 (×3): 20 mg via INTRAVENOUS

## 2015-07-03 MED ORDER — KETOROLAC TROMETHAMINE 30 MG/ML IJ SOLN
INTRAMUSCULAR | Status: AC
  Administered 2015-07-03: 30 mg via INTRAMUSCULAR
  Filled 2015-07-03: qty 1

## 2015-07-03 MED ORDER — LACTATED RINGERS IV SOLN
125.0000 mL/h | INTRAVENOUS | Status: DC
  Administered 2015-07-03: 21:00:00 via INTRAVENOUS
  Administered 2015-07-03 (×2): 125 mL/h via INTRAVENOUS

## 2015-07-03 MED ORDER — CHLOROPROCAINE HCL (PF) 3 % IJ SOLN
INTRAMUSCULAR | Status: AC
Start: 2015-07-03 — End: 2015-07-03
  Filled 2015-07-03: qty 20

## 2015-07-03 MED ORDER — CEFAZOLIN SODIUM-DEXTROSE 2-3 GM-% IV SOLR: 2.0000 g | INTRAVENOUS | Status: DC

## 2015-07-03 MED ORDER — KETOROLAC TROMETHAMINE 30 MG/ML IJ SOLN: 30.0000 mg | Freq: Four times a day (QID) | INTRAMUSCULAR | Status: AC | PRN

## 2015-07-03 MED ORDER — LACTATED RINGERS IV SOLN
125.0000 mL/h | INTRAVENOUS | Status: DC
  Administered 2015-07-03: 125 mL/h via INTRAVENOUS

## 2015-07-03 MED ORDER — PHENYLEPHRINE 8 MG IN D5W 100 ML (0.08MG/ML) PREMIX OPTIME
INJECTION | INTRAVENOUS | Status: AC
  Filled 2015-07-03: qty 100

## 2015-07-03 MED ORDER — MIDAZOLAM HCL 2 MG/2ML IJ SOLN
INTRAMUSCULAR | Status: AC
  Filled 2015-07-03: qty 2

## 2015-07-03 MED ORDER — SCOPOLAMINE 1 MG/3DAYS TD PT72
MEDICATED_PATCH | TRANSDERMAL | Status: DC | PRN
  Administered 2015-07-03: 1 via TRANSDERMAL

## 2015-07-03 MED ORDER — FENTANYL CITRATE (PF) 100 MCG/2ML IJ SOLN
INTRAMUSCULAR | Status: DC | PRN
  Administered 2015-07-03: 25 ug via INTRATHECAL
  Administered 2015-07-03: 75 ug via INTRAVENOUS
  Administered 2015-07-03: 100 ug via INTRAVENOUS

## 2015-07-03 MED ORDER — MIDAZOLAM HCL 2 MG/2ML IJ SOLN
INTRAMUSCULAR | Status: DC | PRN
  Administered 2015-07-03: 2 mg via INTRAVENOUS

## 2015-07-03 MED ORDER — CHLOROPROCAINE HCL (PF) 3 % IJ SOLN
INTRAMUSCULAR | Status: DC | PRN
  Administered 2015-07-03: 20 mL

## 2015-07-03 SURGICAL SUPPLY — 39 items
APL SKNCLS STERI-STRIP NONHPOA (GAUZE/BANDAGES/DRESSINGS) ×1
BENZOIN TINCTURE PRP APPL 2/3 (GAUZE/BANDAGES/DRESSINGS) ×3 IMPLANT
CATH ROBINSON RED A/P 16FR (CATHETERS) IMPLANT
CLAMP CORD UMBIL (MISCELLANEOUS) IMPLANT
CLIP FILSHIE TUBAL LIGA STRL (Clip) ×2 IMPLANT
CLOSURE WOUND 1/2 X4 (GAUZE/BANDAGES/DRESSINGS) ×1
CLOTH BEACON ORANGE TIMEOUT ST (SAFETY) ×3 IMPLANT
DRAPE SHEET LG 3/4 BI-LAMINATE (DRAPES) IMPLANT
DRSG OPSITE POSTOP 4X10 (GAUZE/BANDAGES/DRESSINGS) ×3 IMPLANT
DURAPREP 26ML APPLICATOR (WOUND CARE) ×3 IMPLANT
ELECT REM PT RETURN 9FT ADLT (ELECTROSURGICAL) ×3
ELECTRODE REM PT RTRN 9FT ADLT (ELECTROSURGICAL) ×1 IMPLANT
EXTRACTOR VACUUM M CUP 4 TUBE (SUCTIONS) IMPLANT
EXTRACTOR VACUUM M CUP 4' TUBE (SUCTIONS)
GLOVE BIOGEL PI IND STRL 7.0 (GLOVE) ×1 IMPLANT
GLOVE BIOGEL PI IND STRL 7.5 (GLOVE) ×2 IMPLANT
GLOVE BIOGEL PI INDICATOR 7.0 (GLOVE) ×2
GLOVE BIOGEL PI INDICATOR 7.5 (GLOVE) ×4
GLOVE ECLIPSE 7.5 STRL STRAW (GLOVE) ×3 IMPLANT
GOWN STRL REUS W/TWL LRG LVL3 (GOWN DISPOSABLE) ×9 IMPLANT
KIT ABG SYR 3ML LUER SLIP (SYRINGE) IMPLANT
NDL HYPO 25X5/8 SAFETYGLIDE (NEEDLE) IMPLANT
NEEDLE HYPO 25X5/8 SAFETYGLIDE (NEEDLE) IMPLANT
NS IRRIG 1000ML POUR BTL (IV SOLUTION) ×3 IMPLANT
PACK C SECTION WH (CUSTOM PROCEDURE TRAY) ×3 IMPLANT
PAD ABD 8X7 1/2 STERILE (GAUZE/BANDAGES/DRESSINGS) ×2 IMPLANT
PAD OB MATERNITY 4.3X12.25 (PERSONAL CARE ITEMS) ×3 IMPLANT
PENCIL SMOKE EVAC W/HOLSTER (ELECTROSURGICAL) ×3 IMPLANT
RTRCTR C-SECT PINK 25CM LRG (MISCELLANEOUS) ×3 IMPLANT
STRIP CLOSURE SKIN 1/2X4 (GAUZE/BANDAGES/DRESSINGS) ×2 IMPLANT
SUT MNCRL 0 VIOLET CTX 36 (SUTURE) IMPLANT
SUT MONOCRYL 0 CTX 36 (SUTURE)
SUT VIC AB 0 CTX 36 (SUTURE) ×9
SUT VIC AB 0 CTX36XBRD ANBCTRL (SUTURE) ×3 IMPLANT
SUT VIC AB 2-0 CT1 27 (SUTURE) ×3
SUT VIC AB 2-0 CT1 TAPERPNT 27 (SUTURE) ×1 IMPLANT
SUT VIC AB 4-0 KS 27 (SUTURE) ×3 IMPLANT
TOWEL OR 17X24 6PK STRL BLUE (TOWEL DISPOSABLE) ×3 IMPLANT
TRAY FOLEY CATH SILVER 14FR (SET/KITS/TRAYS/PACK) ×3 IMPLANT

## 2015-07-03 NOTE — Anesthesia Procedure Notes (Signed)
Spinal Patient location during procedure: OR Start time: 07/03/2015 8:02 PM Staffing Anesthesiologist: Mal AmabileFOSTER, Jamilet Ambroise Performed by: anesthesiologist  Preanesthetic Checklist Completed: patient identified, site marked, surgical consent, pre-op evaluation, timeout performed, IV checked, risks and benefits discussed and monitors and equipment checked Spinal Block Patient position: sitting Prep: site prepped and draped and DuraPrep Patient monitoring: heart rate, cardiac monitor, continuous pulse ox and blood pressure Approach: midline Location: L3-4 Injection technique: single-shot Needle Needle type: Sprotte  Needle gauge: 24 G Needle length: 9 cm Needle insertion depth: 5 cm Assessment Sensory level: T4 Additional Notes Patient tolerated procedure well. Adequate sensory level.

## 2015-07-03 NOTE — Op Note (Signed)
Cesarean Section Operative Report  [name]  07/03/2015  Indications: cholestasis of pregnancy, history 3 prior cesarean sections   Pre-operative Diagnosis: Repeat C/S with Tubaligation.   Post-operative Diagnosis: Same   Surgeon: Surgeon(s) and Role:    * Kathrynn RunningNoah Bedford Oluwatoni Rotunno, MD - Resident - Assisting    * Catalina AntiguaPeggy Constant, MD - Assisting    * Levie HeritageJacob J Stinson, DO - Primary   Attending Attestation: I was present and scrubbed for the entire procedure.   Anesthesia: spinal    Estimated Blood Loss: 1000 ml  Total IV Fluids: 2400 ml LR   Urine Output:: 100 ml clear yellow urine  Specimens: none  Findings: Viable female infant in cephalic presentation; Apgars 8/9; weight 2790 g; arterial cord pH not obtained; clear amniotic fluid; intact placenta with three vessel cord; normal uterus, fallopian tubes and ovaries bilaterally. Moderate amount anterior abdominal adhesions. Uterus firmly adhered to lower uterine segment - incision was made superior to this, in the body of the uterus.  Baby condition / location:  Couplet care / Skin to Skin   Complications: none (note transverse incision in body of uterus as discussed in findings)  Indications: [name] is a 29 y.o. [phone] with an IUP 7967w2d presenting for repeat cesarean section (history 3 prior). Indication for delivery today is cholestasis of pregnancy.  The risks, benefits, complications, treatment options, and expected outcomes were discussed with the patient . The patient concurred with the proposed plan, giving informed consent. identified as [name] and the procedure verified as C-Section Delivery.  Procedure Details:  The patient was taken back to the operative suite where spinal anesthesia was placed.  A time out was held and the above information confirmed.   After induction of anesthesia, the patient was draped and prepped in the usual sterile manner and placed in a dorsal supine position with a  leftward tilt. A Pfannenstiel incision was made and carried down through the subcutaneous tissue to the fascia. Fascial incision was made and sharply extended transversely. The fascia was separated from the underlying rectus tissue superiorly and inferiorly. The peritoneum was identified and sharply entered and extended longitudinally. Bladder adhered to lower uterine segment. Rich retractor used to rect bladder.  A transverse uterine incision was made superior to the adhered bladder and extended bluntly. Delivered from cephalic presentation was a viable infant with Apgars and weight as above. The umbilical cord was clamped and cut cord blood was obtained for evaluation. Cord ph was not sent. The placenta was removed Intact and appeared normal. The uterine outline, tubes and ovaries appeared normal}. The uterine incision was closed with running locked sutures of 0Vicryl with an imbricating layer of the same.   Hemostasis was observed. The Fallopian tubes were identified bilaterally.  A Filshie clip was placed on each tube without difficulty 3 cm from the cornua.  There was no bleeding. The peritoneum and rectus muscles were reapproximated with 2 interrupted sutures of 0 vicryl. The rectus muscles were examined and hemostasis observed. The fascia was then reapproximated with running sutures of 0Vicryl.  The skin was closed with 4-0Vicryl.   Instrument, sponge, and needle counts were correct prior the abdominal closure and were correct at the conclusion of the case.     Disposition: PACU - hemodynamically stable.   Maternal Condition: stable       Signed: Lavonne Chickoah B WoukMD 07/03/2015 10:27 PM
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
## 2015-07-03 NOTE — Anesthesia Preprocedure Evaluation (Addendum)
Anesthesia Evaluation  Patient identified by MRN, date of birth, ID band Patient awake    Reviewed: Allergy & Precautions, H&P , NPO status , Patient's Chart, lab work & pertinent test results  Airway Mallampati: II  TM Distance: >3 FB Neck ROM: full    Dental no notable dental hx.    Pulmonary neg pulmonary ROS, Current Smoker,    Pulmonary exam normal        Cardiovascular negative cardio ROS Normal cardiovascular exam     Neuro/Psych negative neurological ROS     GI/Hepatic negative GI ROS, Neg liver ROS,   Endo/Other  negative endocrine ROS  Renal/GU negative Renal ROS     Musculoskeletal   Abdominal Normal abdominal exam  (+)   Peds  Hematology   Anesthesia Other Findings   Reproductive/Obstetrics (+) Pregnancy                            Anesthesia Physical Anesthesia Plan  ASA: II  Anesthesia Plan: Spinal   Post-op Pain Management:    Induction:   Airway Management Planned:   Additional Equipment:   Intra-op Plan:   Post-operative Plan:   Informed Consent: I have reviewed the patients History and Physical, chart, labs and discussed the procedure including the risks, benefits and alternatives for the proposed anesthesia with the patient or authorized representative who has indicated his/her understanding and acceptance.     Plan Discussed with: CRNA and Surgeon  Anesthesia Plan Comments:        Anesthesia Quick Evaluation

## 2015-07-03 NOTE — Telephone Encounter (Signed)
Per Dr. Jolayne Pantheronstant need to notify patient of cholestasis of pregnancy and scheduled IOL today. Called Labor and delivery and scheduled patient for direct admit today for IOL with cervical ripening. Called Monasia and notified her to come today at 12 noon for IOL for cholestasis. She voices understanding.

## 2015-07-03 NOTE — Anesthesia Postprocedure Evaluation (Signed)
Anesthesia Post Note  Patient: [name]  Procedure(s) Performed: Procedure(s) (LRB): CESAREAN SECTION (N/A)  Patient location during evaluation: PACU Anesthesia Type: Spinal Level of consciousness: oriented and awake and alert Pain management: pain level controlled Vital Signs Assessment: post-procedure vital signs reviewed and stable Respiratory status: spontaneous breathing, respiratory function stable and patient connected to nasal cannula oxygen Cardiovascular status: blood pressure returned to baseline and stable Postop Assessment: no headache, no backache and spinal receding Anesthetic complications: no    Last Vitals:  Filed Vitals:   07/03/15 2136 07/03/15 2145  BP:  109/80  Pulse: 94 76  Temp:    Resp: 13 22    Last Pain:  Filed Vitals:   07/03/15 2152  PainSc: 0-No pain                 Mackensey Bolte A.

## 2015-07-03 NOTE — Transfer of Care (Signed)
Immediate Anesthesia Transfer of Care Note  Patient: [name]  Procedure(s) Performed: Procedure(s): CESAREAN SECTION (N/A)  Patient Location: PACU  Anesthesia Type:Spinal  Level of Consciousness: awake, alert , oriented and patient cooperative  Airway & Oxygen Therapy: Patient Spontanous Breathing  Post-op Assessment: Report given to RN and Post -op Vital signs reviewed and stable  Post vital signs: Reviewed and stable  Last Vitals:  Filed Vitals:   07/03/15 1535 07/03/15 1925  BP: 107/57 107/54  Pulse: 81 88  Temp: 36.9 C 37.3 C  Resp: 18 16    Complications: No apparent anesthesia complications

## 2015-07-04 ENCOUNTER — Encounter (HOSPITAL_COMMUNITY): Payer: Self-pay | Admitting: Family Medicine

## 2015-07-04 DIAGNOSIS — K831 Obstruction of bile duct: Secondary | ICD-10-CM | POA: Diagnosis present

## 2015-07-04 DIAGNOSIS — O26613 Liver and biliary tract disorders in pregnancy, third trimester: Secondary | ICD-10-CM

## 2015-07-04 LAB — RPR: RPR: NONREACTIVE

## 2015-07-04 LAB — CBC
HCT: 25 % — ABNORMAL LOW (ref 36.0–46.0)
Hemoglobin: 8.2 g/dL — ABNORMAL LOW (ref 12.0–15.0)
MCH: 26.8 pg (ref 26.0–34.0)
MCHC: 32.8 g/dL (ref 30.0–36.0)
MCV: 81.7 fL (ref 78.0–100.0)
Platelets: 216 10*3/uL (ref 150–400)
RBC: 3.06 MIL/uL — ABNORMAL LOW (ref 3.87–5.11)
RDW: 15.5 % (ref 11.5–15.5)
WBC: 13.6 10*3/uL — ABNORMAL HIGH (ref 4.0–10.5)

## 2015-07-04 MED ORDER — NALBUPHINE HCL 10 MG/ML IJ SOLN
5.0000 mg | Freq: Once | INTRAMUSCULAR | Status: AC | PRN
  Administered 2015-07-04: 5 mg via INTRAVENOUS

## 2015-07-04 MED ORDER — FERROUS SULFATE 325 (65 FE) MG PO TABS
325.0000 mg | ORAL_TABLET | Freq: Every day | ORAL | Status: DC
  Administered 2015-07-04 – 2015-07-07 (×3): 325 mg via ORAL
  Filled 2015-07-04 (×4): qty 1

## 2015-07-04 MED ORDER — WITCH HAZEL-GLYCERIN EX PADS: 1.0000 "application " | MEDICATED_PAD | CUTANEOUS | Status: DC | PRN

## 2015-07-04 MED ORDER — SCOPOLAMINE 1 MG/3DAYS TD PT72
1.0000 | MEDICATED_PATCH | Freq: Once | TRANSDERMAL | Status: DC
  Filled 2015-07-04: qty 1

## 2015-07-04 MED ORDER — DIPHENHYDRAMINE HCL 25 MG PO CAPS
25.0000 mg | ORAL_CAPSULE | Freq: Four times a day (QID) | ORAL | Status: DC | PRN
  Administered 2015-07-04: 25 mg via ORAL
  Filled 2015-07-04: qty 1

## 2015-07-04 MED ORDER — ZOLPIDEM TARTRATE 5 MG PO TABS: 5.0000 mg | ORAL_TABLET | Freq: Every evening | ORAL | Status: DC | PRN

## 2015-07-04 MED ORDER — ACETAMINOPHEN 500 MG PO TABS
1000.0000 mg | ORAL_TABLET | Freq: Four times a day (QID) | ORAL | Status: AC
  Filled 2015-07-04: qty 2

## 2015-07-04 MED ORDER — NALBUPHINE HCL 10 MG/ML IJ SOLN: 5.0000 mg | Freq: Once | INTRAMUSCULAR | Status: AC | PRN

## 2015-07-04 MED ORDER — NALBUPHINE HCL 10 MG/ML IJ SOLN
5.0000 mg | INTRAMUSCULAR | Status: DC | PRN
  Filled 2015-07-04: qty 1

## 2015-07-04 MED ORDER — NALBUPHINE HCL 10 MG/ML IJ SOLN: 5.0000 mg | INTRAMUSCULAR | Status: DC | PRN

## 2015-07-04 MED ORDER — OXYCODONE-ACETAMINOPHEN 5-325 MG PO TABS
1.0000 | ORAL_TABLET | ORAL | Status: DC | PRN
  Administered 2015-07-04 – 2015-07-05 (×3): 1 via ORAL
  Filled 2015-07-04 (×6): qty 1

## 2015-07-04 MED ORDER — TETANUS-DIPHTH-ACELL PERTUSSIS 5-2.5-18.5 LF-MCG/0.5 IM SUSP: 0.5000 mL | Freq: Once | INTRAMUSCULAR | Status: DC

## 2015-07-04 MED ORDER — RHO D IMMUNE GLOBULIN 1500 UNIT/2ML IJ SOSY
300.0000 ug | PREFILLED_SYRINGE | Freq: Once | INTRAMUSCULAR | Status: AC
  Filled 2015-07-04: qty 2

## 2015-07-04 MED ORDER — IBUPROFEN 600 MG PO TABS
600.0000 mg | ORAL_TABLET | Freq: Four times a day (QID) | ORAL | Status: DC
  Administered 2015-07-04 – 2015-07-06 (×9): 600 mg via ORAL
  Filled 2015-07-04 (×9): qty 1

## 2015-07-04 MED ORDER — SIMETHICONE 80 MG PO CHEW
80.0000 mg | CHEWABLE_TABLET | ORAL | Status: DC
  Administered 2015-07-05 – 2015-07-07 (×3): 80 mg via ORAL
  Filled 2015-07-04 (×3): qty 1

## 2015-07-04 MED ORDER — IBUPROFEN 600 MG PO TABS: 600.0000 mg | ORAL_TABLET | Freq: Four times a day (QID) | ORAL | Status: DC | PRN

## 2015-07-04 MED ORDER — ONDANSETRON HCL 4 MG/2ML IJ SOLN: 4.0000 mg | Freq: Three times a day (TID) | INTRAMUSCULAR | Status: DC | PRN

## 2015-07-04 MED ORDER — SIMETHICONE 80 MG PO CHEW
80.0000 mg | CHEWABLE_TABLET | Freq: Three times a day (TID) | ORAL | Status: DC
  Administered 2015-07-04 – 2015-07-07 (×10): 80 mg via ORAL
  Filled 2015-07-04 (×10): qty 1

## 2015-07-04 MED ORDER — NALOXONE HCL 2 MG/2ML IJ SOSY
1.0000 ug/kg/h | PREFILLED_SYRINGE | INTRAVENOUS | Status: DC | PRN
  Filled 2015-07-04: qty 2

## 2015-07-04 MED ORDER — NALOXONE HCL 0.4 MG/ML IJ SOLN: 0.4000 mg | INTRAMUSCULAR | Status: DC | PRN

## 2015-07-04 MED ORDER — LANOLIN HYDROUS EX OINT: 1.0000 "application " | TOPICAL_OINTMENT | CUTANEOUS | Status: DC | PRN

## 2015-07-04 MED ORDER — OXYTOCIN 40 UNITS IN LACTATED RINGERS INFUSION - SIMPLE MED: 62.5000 mL/h | INTRAVENOUS | Status: AC

## 2015-07-04 MED ORDER — LACTATED RINGERS IV SOLN: INTRAVENOUS | Status: DC

## 2015-07-04 MED ORDER — SENNOSIDES-DOCUSATE SODIUM 8.6-50 MG PO TABS
2.0000 | ORAL_TABLET | ORAL | Status: DC
  Administered 2015-07-05 – 2015-07-07 (×3): 2 via ORAL
  Filled 2015-07-04 (×3): qty 2

## 2015-07-04 MED ORDER — PRENATAL MULTIVITAMIN CH
1.0000 | ORAL_TABLET | Freq: Every day | ORAL | Status: DC
  Administered 2015-07-04 – 2015-07-06 (×2): 1 via ORAL
  Filled 2015-07-04 (×3): qty 1

## 2015-07-04 MED ORDER — SIMETHICONE 80 MG PO CHEW: 80.0000 mg | CHEWABLE_TABLET | ORAL | Status: DC | PRN

## 2015-07-04 MED ORDER — SODIUM CHLORIDE 0.9 % IJ SOLN
3.0000 mL | INTRAMUSCULAR | Status: DC | PRN
  Administered 2015-07-06 (×3): 3 mL via INTRAVENOUS
  Filled 2015-07-04 (×3): qty 3

## 2015-07-04 MED ORDER — DIBUCAINE 1 % RE OINT: 1.0000 "application " | TOPICAL_OINTMENT | RECTAL | Status: DC | PRN

## 2015-07-04 MED ORDER — ACETAMINOPHEN 325 MG PO TABS
650.0000 mg | ORAL_TABLET | ORAL | Status: DC | PRN
  Administered 2015-07-06: 650 mg via ORAL
  Filled 2015-07-04: qty 2

## 2015-07-04 MED ORDER — MENTHOL 3 MG MT LOZG: 1.0000 | LOZENGE | OROMUCOSAL | Status: DC | PRN

## 2015-07-04 MED ORDER — OXYCODONE-ACETAMINOPHEN 5-325 MG PO TABS
2.0000 | ORAL_TABLET | ORAL | Status: DC | PRN
  Administered 2015-07-04 – 2015-07-07 (×9): 2 via ORAL
  Filled 2015-07-04 (×7): qty 2
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
## 2015-07-04 NOTE — Addendum Note (Signed)
Addendum  created 07/04/15 0827 by Algis GreenhouseLinda A [place], CRNA   Modules edited: Clinical Notes   Clinical Notes:  File: [id number]

## 2015-07-04 NOTE — Anesthesia Postprocedure Evaluation (Signed)
Anesthesia Post Note  Patient: Thornell MuleMelissa F Sukup  Procedure(s) Performed: Procedure(s) (LRB): CESAREAN SECTION (N/A)  Patient location during evaluation: Mother Baby Anesthesia Type: Spinal Level of consciousness: awake and alert Pain management: satisfactory to patient Respiratory status: spontaneous breathing Cardiovascular status: stable Postop Assessment: spinal receding Anesthetic complications: no    Last Vitals:  Filed Vitals:   07/04/15 0429 07/04/15 0432  BP: 83/35 72/45  Pulse: 62 72  Temp:    Resp: 18 18    Last Pain:  Filed Vitals:   07/04/15 0728  PainSc: 0-No pain                 Isatu Macinnes

## 2015-07-04 NOTE — Progress Notes (Signed)
Post Partum Day 1   Subjective:  [name] is a 29 y.o. [id number] 7477w2d s/p LTCS.  No acute events overnight.  Pt still has urine catheter in place. Patient has tried to ambulate this morning, but had some pain associated with this. Patient has been able to have PO intake.  She denies nausea or vomiting.  Pain is moderately controlled.  She has not had flatus. She has not had bowel movement.  Lochia Moderate.  Plan for birth control is bilateral tubal ligation.  Method of Feeding: Breast/bottle   Objective: BP 72/45 mmHg  Pulse 72  Temp(Src) 98.6 F (37 C) (Oral)  Resp 18  Ht 4\' 11"  (1.499 m)  Wt 148 lb (67.132 kg)  BMI 29.88 kg/m2  SpO2 99%  LMP 10/16/2014 (Within Days)  Breastfeeding? Unknown  Physical Exam:  General: alert, cooperative and no distress Lochia:normal flow Chest: CTAB Heart: RRR no m/r/g Abdomen: +BS, soft, nontender, fundus firm at/below umbilicus Uterine Fundus: firm, dressing in place  DVT Evaluation: No evidence of DVT seen on physical exam. Extremities: lower extremity edema   Recent Labs  07/03/15 1250 07/04/15 0610  HGB 10.9* 8.2*  HCT 33.9* 25.0*    Assessment/Plan:  ASSESSMENT: Kristina MuleMelissa F Ligas is a 29 y.o. [id number] 3977w2d ppd #1 s/p LTCS doing well.    Regular postpartum management  Received Rhogam   LOS: 1 day   Hearl Heikes Z Ma Munoz 07/04/2015, 7:45 AM

## 2015-07-04 NOTE — Progress Notes (Signed)
Called Dr. Juventino SlovakWock regarding vital signs of 83/38 and 72/48. No further orders given due to already low BP baseline.

## 2015-07-04 NOTE — Lactation Note (Signed)
This note was copied from the chart of Kristina Yohanna Benefiel. Lactation Consultation Note  Patient Name: [name] XBJYN'WToday's Date: 07/04/2015 Reason for consult: Initial assessment Baby at 20 hr of life and parents are worried that baby has not eaten well. Mom is worried that her nipples "do not stick out enough". She does have short shaft nipples that appear normal. It did take baby 10 minutes to latch. Baby was licking and mouthing the breast. After changing her position to cross cradle mom was able to latch baby with no assistance. LC did not see a need for a NS at this time, answered parents questions about the risk and benefits of using the NS. Mom will bf baby on demand 8+/24hr and pump after feeding with the DEBP. She will feed back the colostrum that she gets. Mom is able to demonstrate manual expression, colostrum noted bilaterally. RN fed back 2 drops of expressed milk with a spoon. Reviewed baby behavior, feeding frequency, voids, baby belly size, breast changes, and nipple care. Given lactation handouts, LPT infant sheet, and marijuana sheet. Mom is aware of OP services and support group.    Maternal Data Has patient been taught Hand Expression?: Yes Does the patient have breastfeeding experience prior to this delivery?: Yes  Feeding Feeding Type: Breast Fed Length of feed: 10 min (still going )  LATCH Score/Interventions Latch: Repeated attempts needed to sustain latch, nipple held in mouth throughout feeding, stimulation needed to elicit sucking reflex. Intervention(s): Adjust position;Assist with latch;Breast compression  Audible Swallowing: Spontaneous and intermittent Intervention(s): Hand expression;Skin to skin  Type of Nipple: Everted at rest and after stimulation  Comfort (Breast/Nipple): Soft / non-tender     Hold (Positioning): Assistance needed to correctly position infant at breast and maintain latch. Intervention(s): Support Pillows;Position  options  LATCH Score: 8  Lactation Tools Discussed/Used WIC Program: No Pump Review: Setup, frequency, and cleaning;Milk Storage Initiated by:: ES Date initiated:: 07/04/15   Consult Status Consult Status: Follow-up Date: 07/05/15 Follow-up type: In-patient    [name] 07/04/2015, 5:09 PM
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
## 2015-07-05 LAB — RH IG WORKUP (INCLUDES ABO/RH)
ABO/RH(D): O NEG
Fetal Screen: NEGATIVE
GESTATIONAL AGE(WKS): 37.2
Unit division: 0

## 2015-07-05 LAB — BIRTH TISSUE RECOVERY COLLECTION (PLACENTA DONATION)

## 2015-07-05 MED ORDER — FLUOXETINE HCL 20 MG PO CAPS
20.0000 mg | ORAL_CAPSULE | Freq: Every day | ORAL | Status: DC
  Administered 2015-07-05 – 2015-07-07 (×3): 20 mg via ORAL
  Filled 2015-07-05 (×3): qty 1

## 2015-07-05 MED ORDER — ONDANSETRON HCL 4 MG PO TABS
4.0000 mg | ORAL_TABLET | ORAL | Status: DC | PRN
  Administered 2015-07-05 – 2015-07-07 (×2): 4 mg via ORAL
  Filled 2015-07-05 (×2): qty 1

## 2015-07-05 MED ORDER — SERTRALINE HCL 50 MG PO TABS
50.0000 mg | ORAL_TABLET | Freq: Every day | ORAL | Status: DC
  Filled 2015-07-05: qty 1

## 2015-07-05 NOTE — Lactation Note (Signed)
This note was copied from the chart of Girl Zuleima Worster. Lactation Consultation Note Mom having difficulty latching. Requested nipple shield. Stated she had to use one with her other child. Has everted nipples but when compressed sinks inwards some to a shorter shaft making a deep latch difficult. Fitted mom w/#20NS. Application taught. Mom latched baby w/o difficulty. Stated felt much better.  Patient Name: Girl Christa SeeMelissa Kashani ZOXWR'UToday's Date: 07/05/2015 Reason for consult: Follow-up assessment;Difficult latch   Maternal Data    Feeding Feeding Type: Breast Fed Length of feed: 15 min (still BF)  LATCH Score/Interventions Latch: Grasps breast easily, tongue down, lips flanged, rhythmical sucking. Intervention(s): Adjust position;Assist with latch;Breast massage;Breast compression  Audible Swallowing: Spontaneous and intermittent Intervention(s): Skin to skin;Hand expression;Alternate breast massage  Type of Nipple: Everted at rest and after stimulation  Comfort (Breast/Nipple): Soft / non-tender     Hold (Positioning): Assistance needed to correctly position infant at breast and maintain latch. Intervention(s): Skin to skin;Position options;Support Pillows;Breastfeeding basics reviewed  LATCH Score: 9  Lactation Tools Discussed/Used Tools: Pump;Nipple Shields Nipple shield size: 20 Breast pump type: Double-Electric Breast Pump   Consult Status Consult Status: Follow-up Date: 07/06/15 Follow-up type: In-patient    Charyl DancerCARVER, Kenyada Hy G 07/05/2015, 4:54 AM
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
## 2015-07-05 NOTE — Plan of Care (Signed)
Pt feeling nauseous, talked to resident and received telephone read back for an order of Zofran 4 mg q4 PRN.

## 2015-07-05 NOTE — Progress Notes (Signed)
Post Op Day 2 Subjective: no complaints, up ad lib, voiding, tolerating PO and + flatus. Doing well overall, though still with some pain and some itching.   Objective: Blood pressure 94/47, pulse 60, temperature 98.1 F (36.7 C), temperature source Oral, resp. rate 18, height 4\' 11"  (1.499 m), weight 67.132 kg (148 lb), last menstrual period 10/16/2014, SpO2 100 %, unknown if currently breastfeeding.  Physical Exam:  General: alert, cooperative and no distress Lochia: appropriate Uterine Fundus: firm, mildly TTP.  Incision: healing well, no significant drainage, no dehiscence, no significant erythema DVT Evaluation: No evidence of DVT seen on physical exam.   Recent Labs  07/03/15 1250 07/04/15 0610  HGB 10.9* 8.2*  HCT 33.9* 25.0*    Assessment/Plan: Plan for discharge tomorrow - Continue current care - Percocet prn pain - Benadryl / Nubain prn itching.    LOS: 2 days   Caleb Melancon 07/05/2015, 6:59 AM   OB fellow attestation Post Partum Day /POD#2 I have seen and examined this patient and agree with above documentation in the resident's note.   [name] is a 29 y.o. [id number] s/p rLTCS w/BTL.  Pt denies problems with ambulating, voiding or po intake. Pain is well controlled.  Plan for birth control is bilateral tubal ligation.  Method of Feeding: formula  PE:  BP 94/47 mmHg  Pulse 60  Temp(Src) 98.1 F (36.7 C) (Oral)  Resp 18  Ht 4\' 11"  (1.499 m)  Wt 148 lb (67.132 kg)  BMI 29.88 kg/m2  SpO2 100%  LMP 10/16/2014 (Within Days)  Breastfeeding? Unknown Gen: well appearing Heart: reg rate Lungs: normal WOB Fundus firm Ext: soft, no pain, no edema Incision: c/d/i  Plan for discharge: POD#3 Continue pain management   Kristina FlakeKimberly Niles [place] Kretz, MD 8:05 AM

## 2015-07-06 DIAGNOSIS — Z302 Encounter for sterilization: Secondary | ICD-10-CM

## 2015-07-06 DIAGNOSIS — Z98891 History of uterine scar from previous surgery: Secondary | ICD-10-CM

## 2015-07-06 LAB — CBC
HCT: 23.8 % — ABNORMAL LOW (ref 36.0–46.0)
Hemoglobin: 7.6 g/dL — ABNORMAL LOW (ref 12.0–15.0)
MCH: 26.9 pg (ref 26.0–34.0)
MCHC: 31.9 g/dL (ref 30.0–36.0)
MCV: 84.1 fL (ref 78.0–100.0)
PLATELETS: 255 10*3/uL (ref 150–400)
RBC: 2.83 MIL/uL — ABNORMAL LOW (ref 3.87–5.11)
RDW: 15.9 % — AB (ref 11.5–15.5)
WBC: 9.3 10*3/uL (ref 4.0–10.5)

## 2015-07-06 LAB — PREPARE RBC (CROSSMATCH)

## 2015-07-06 MED ORDER — FLUOXETINE HCL 20 MG PO CAPS: 20.0000 mg | ORAL_CAPSULE | Freq: Every day | ORAL | Status: DC

## 2015-07-06 MED ORDER — FERROUS SULFATE 325 (65 FE) MG PO TABS: 325.0000 mg | ORAL_TABLET | Freq: Every day | ORAL | Status: DC

## 2015-07-06 MED ORDER — ACETAMINOPHEN 325 MG PO TABS
650.0000 mg | ORAL_TABLET | Freq: Once | ORAL | Status: AC
  Administered 2015-07-06: 650 mg via ORAL
  Filled 2015-07-06: qty 2

## 2015-07-06 MED ORDER — KETOROLAC TROMETHAMINE 30 MG/ML IJ SOLN
30.0000 mg | Freq: Three times a day (TID) | INTRAMUSCULAR | Status: DC
  Administered 2015-07-06 – 2015-07-07 (×2): 30 mg via INTRAVENOUS
  Filled 2015-07-06 (×2): qty 1

## 2015-07-06 MED ORDER — OXYCODONE-ACETAMINOPHEN 5-325 MG PO TABS: 1.0000 | ORAL_TABLET | ORAL | Status: DC | PRN

## 2015-07-06 MED ORDER — DIPHENHYDRAMINE HCL 25 MG PO CAPS
25.0000 mg | ORAL_CAPSULE | Freq: Once | ORAL | Status: AC
  Administered 2015-07-06: 25 mg via ORAL
  Filled 2015-07-06: qty 1

## 2015-07-06 MED ORDER — SODIUM CHLORIDE 0.9 % IV SOLN
Freq: Once | INTRAVENOUS | Status: AC
  Administered 2015-07-06: 10:00:00 via INTRAVENOUS

## 2015-07-06 MED ORDER — OXYCODONE HCL 5 MG PO TABS
10.0000 mg | ORAL_TABLET | Freq: Once | ORAL | Status: AC
  Administered 2015-07-06: 10 mg via ORAL
  Filled 2015-07-06: qty 2

## 2015-07-06 MED ORDER — KETOROLAC TROMETHAMINE 30 MG/ML IJ SOLN
30.0000 mg | Freq: Once | INTRAMUSCULAR | Status: AC
  Administered 2015-07-06: 30 mg via INTRAVENOUS
  Filled 2015-07-06: qty 1

## 2015-07-06 MED ORDER — SODIUM CHLORIDE 0.9 % IV SOLN
Freq: Once | INTRAVENOUS | Status: AC
  Administered 2015-07-06: 16:00:00 via INTRAVENOUS

## 2015-07-06 MED ORDER — IBUPROFEN 600 MG PO TABS: 600.0000 mg | ORAL_TABLET | Freq: Four times a day (QID) | ORAL | Status: DC | PRN

## 2015-07-06 MED ORDER — FUROSEMIDE 10 MG/ML IJ SOLN
20.0000 mg | Freq: Once | INTRAMUSCULAR | Status: AC
  Administered 2015-07-06: 20 mg via INTRAVENOUS
  Filled 2015-07-06: qty 2

## 2015-07-06 NOTE — Lactation Note (Signed)
This note was copied from the chart of [name]. Lactation Consultation Note Mom states that BF is going well since she got the NS. Baby latching well and feels like the baby is satisfied after feeding. Mom is having abdominal pain, needs to walk more in hall to relieve gas, and incisional pain. Reminded of community resources and LC out pt. Services. Patient Name: Kristina Christa SeeMelissa Kocourek NWGNF'AToday's Date: 07/06/2015 Reason for consult: Follow-up assessment   Maternal Data    Feeding Feeding Type: Breast Fed Length of feed: 25 min  LATCH Score/Interventions                      Lactation Tools Discussed/Used     Consult Status Consult Status: Complete Date: 07/06/15    Charyl DancerCARVER, Sari Cogan G 07/06/2015, 6:42 AM

## 2015-07-06 NOTE — Progress Notes (Signed)
Post Partum Day 3  Subjective:  [name] is a 29 y.o. [id number] 8115w2d s/p rLTCS.  No acute events overnight.  Pt denies problems with ambulating, voiding or po intake.  She denies nausea or vomiting.  Pain is moderately controlled.  She is not sure if she has had flatus. She has not had bowel movement.  Lochia Minimal.  Plan for birth control is bilateral tubal ligation.  Method of Feeding: Breast/Bottle   Objective: BP 85/37 mmHg  Pulse 68  Temp(Src) 97.5 F (36.4 C) (Oral)  Resp 18  Ht 4\' 11"  (1.499 m)  Wt 148 lb (67.132 kg)  BMI 29.88 kg/m2  SpO2 100%  LMP 10/16/2014 (Within Days)  Breastfeeding? Unknown  Physical Exam:  General: alert, cooperative and no distress Lochia:normal flow Chest: CTAB Heart: RRR no m/r/g Abdomen: +BS, soft, some tenderness to palpation, patient with a moderate bruising along her pelvis, fundus firm at/below umbilicus Uterine Fundus: firm, DVT Evaluation: No evidence of DVT seen on physical exam. Extremities: Slight swelling in feet   Recent Labs  07/04/15 0610 07/06/15 1500  HGB 8.2* 7.6*  HCT 25.0* 23.8*    Assessment/Plan:  ASSESSMENT: Kristina MuleMelissa F [name] is a 29 y.o. [id number] 3615w2d ppd #3  s/p rLTCS for cholestasis doing well - Patient with hx of anxiety and depression - Started on Prozac during hospitalization, patient will go home with baby love.  - Patient with hgb  10.9>8.2> 7.6, patient also indicates feeling light-headed, will transfuse 2 untis and send home with iron    Plan for discharge tomorrow   LOS: 3 days   Asiyah Z Mikell 07/06/2015, 2:12 PM   OB fellow attestation Post Partum Day 3/POD#3  I have seen and examined this patient and agree with above documentation in the resident's note.   [name] is a 29 y.o. [phone] s/p rLTCSx4 with BTL.  Pt denies problems with ambulating, voiding or po intake. Patient reports continued pain and tenderness in lower abdomen. She complains of dizziness, blurry vision,  fatigue and rash on her left lower abdomen. Plan for birth control is bilateral tubal ligation.  Method of Feeding: bottle  PE:  BP 97/46 mmHg  Pulse 75  Temp(Src) 98.9 F (37.2 C) (Oral)  Resp 16  Ht 4\' 11"  (1.499 m)  Wt 148 lb (67.132 kg)  BMI 29.88 kg/m2  SpO2 99%  LMP 10/16/2014 (Within Days)  Breastfeeding? Unknown Gen: well appearing Heart: reg rate Lungs: normal WOB Fundus firm. Abdomen: large contusion over pannus.  Incision: clean/dry/intact Ext: soft, no pain, no edema Skin: on the edge of honeycomb, 90 degree linear erythematous rash with vesicular/blister component. Appears to be where pressure bandage tape was previously placed.   Plan for discharge: POD#4  -Routine post operative care -change from ibuprofen to toradol -Acute blood loss anemia: transfusion of 2 uPRBC  Federico FlakeKimberly Niles Zorawar Strollo, MD 5:46 PM

## 2015-07-06 NOTE — Discharge Instructions (Signed)
Cesarean Delivery, Care After  Refer to this sheet in the next few weeks. These instructions provide you with information on caring for yourself after your procedure. Your health care provider may also give you specific instructions. Your treatment has been planned according to current medical practices, but problems sometimes occur. Call your health care provider if you have any problems or questions after you go home.  HOME CARE INSTRUCTIONS   Only take over-the-counter or prescription medications as directed by your health care provider.   Do not drink alcohol, especially if you are breastfeeding or taking medication to relieve pain.   Do not chew or smoke tobacco.   Continue to use good perineal care. Good perineal care includes:    Wiping your perineum from front to back.    Keeping your perineum clean.   Check your surgical cut (incision) daily for increased redness, drainage, swelling, or separation of skin.   Clean your incision gently with soap and water every day, and then pat it dry. If your health care provider says it is okay, leave the incision uncovered. Use a bandage (dressing) if the incision is draining fluid or appears irritated. If the adhesive strips across the incision do not fall off within 7 days, carefully peel them off.   Hug a pillow when coughing or sneezing until your incision is healed. This helps to relieve pain.   Do not use tampons or douche until your health care provider says it is okay.   Shower, wash your hair, and take tub baths as directed by your health care provider.   Wear a well-fitting bra that provides breast support.   Limit wearing support panties or control-top hose.   Drink enough fluids to keep your urine clear or pale yellow.   Eat high-fiber foods such as whole grain cereals and breads, brown rice, beans, and fresh fruits and vegetables every day. These foods may help prevent or relieve constipation.   Resume activities such as climbing stairs,  driving, lifting, exercising, or traveling as directed by your health care provider.   Talk to your health care provider about resuming sexual activities. This is dependent upon your risk of infection, your rate of healing, and your comfort and desire to resume sexual activity.   Try to have someone help you with your household activities and your newborn for at least a few days after you leave the hospital.   Rest as much as possible. Try to rest or take a nap when your newborn is sleeping.   Increase your activities gradually.   Keep all of your scheduled postpartum appointments. It is very important to keep your scheduled follow-up appointments. At these appointments, your health care provider will be checking to make sure that you are healing physically and emotionally.  SEEK MEDICAL CARE IF:    You are passing large clots from your vagina. Save any clots to show your health care provider.   You have a foul smelling discharge from your vagina.   You have trouble urinating.   You are urinating frequently.   You have pain when you urinate.   You have a change in your bowel movements.   You have increasing redness, pain, or swelling near your incision.   You have pus draining from your incision.   Your incision is separating.   You have painful, hard, or reddened breasts.   You have a severe headache.   You have blurred vision or see spots.   You feel sad   or depressed.   You have thoughts of hurting yourself or your newborn.   You have questions about your care, the care of your newborn, or medications.   You are dizzy or light-headed.   You have a rash.   You have pain, redness, or swelling at the site of the removed intravenous access (IV) tube.   You have nausea or vomiting.   You stopped breastfeeding and have not had a menstrual period within 12 weeks of stopping.   You are not breastfeeding and have not had a menstrual period within 12 weeks of delivery.   You have a fever.  SEEK  IMMEDIATE MEDICAL CARE IF:   You have persistent pain.   You have chest pain.   You have shortness of breath.   You faint.   You have leg pain.   You have stomach pain.   Your vaginal bleeding saturates 2 or more sanitary pads in 1 hour.  MAKE SURE YOU:    Understand these instructions.   Will watch your condition.   Will get help right away if you are not doing well or get worse.     This information is not intended to replace advice given to you by your health care provider. Make sure you discuss any questions you have with your health care provider.     Document Released: 04/13/2002 Document Revised: 08/12/2014 Document Reviewed: 03/18/2012  Elsevier Interactive Patient Education 2016 [employer]

## 2015-07-06 NOTE — Lactation Note (Signed)
This note was copied from the chart of [name]. Lactation Consultation Note  Patient Name: [name] Reason for consult: Follow-up assessment Mom reports bilateral nipple soreness that has improved with the NS. Demonstrated correct placement of NS to further improve nipples and given comfort gels. Encouraged mom to manually express after feedings and use the DEBP. Mom stated that she did not have milk so she started offering bottles of formula but her breast are getting fuller today. Mom will bf on demand, 8+/24hr and offer formula as needed. She is aware of OP services and support group.    Maternal Data    Feeding Length of feed: 15 min  LATCH Score/Interventions Latch:  (RN has not seen latch this shift)                    Lactation Tools Discussed/Used     Consult Status Consult Status: Follow-up Date: 07/07/15 Follow-up type: In-patient    [name] Cervantes, 3:43 PM

## 2015-07-06 NOTE — Progress Notes (Signed)
CSW spoke with A.Guerrero, CPS worker.  CPS reported that she met with the family and created a safety plan. She stated that infant is able to be discharged to MOB and FOB when medically ready.  No barriers to discharge. 

## 2015-07-07 ENCOUNTER — Ambulatory Visit: Payer: Self-pay

## 2015-07-07 DIAGNOSIS — D649 Anemia, unspecified: Secondary | ICD-10-CM

## 2015-07-07 LAB — TYPE AND SCREEN
ABO/RH(D): O NEG
ANTIBODY SCREEN: NEGATIVE
UNIT DIVISION: 0
Unit division: 0

## 2015-07-07 LAB — CBC
HEMATOCRIT: 31 % — AB (ref 36.0–46.0)
HEMOGLOBIN: 10 g/dL — AB (ref 12.0–15.0)
MCH: 27.2 pg (ref 26.0–34.0)
MCHC: 32.3 g/dL (ref 30.0–36.0)
MCV: 84.5 fL (ref 78.0–100.0)
Platelets: 247 10*3/uL (ref 150–400)
RBC: 3.67 MIL/uL — ABNORMAL LOW (ref 3.87–5.11)
RDW: 15.5 % (ref 11.5–15.5)
WBC: 8.9 10*3/uL (ref 4.0–10.5)
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
## 2015-07-08 ENCOUNTER — Inpatient Hospital Stay (HOSPITAL_COMMUNITY)
Admission: AD | Admit: 2015-07-08 | Discharge: 2015-07-08 | Disposition: A | Discharge status: Home / Self Care | Payer: Medicaid Other | Source: Ambulatory Visit | Attending: Obstetrics & Gynecology | Admitting: Obstetrics & Gynecology

## 2015-07-08 ENCOUNTER — Encounter (HOSPITAL_COMMUNITY): Payer: Self-pay | Admitting: *Deleted

## 2015-07-08 DIAGNOSIS — G8918 Other acute postprocedural pain: Secondary | ICD-10-CM | POA: Insufficient documentation

## 2015-07-08 DIAGNOSIS — L7682 Other postprocedural complications of skin and subcutaneous tissue: Secondary | ICD-10-CM

## 2015-07-08 DIAGNOSIS — O9089 Other complications of the puerperium, not elsewhere classified: Secondary | ICD-10-CM | POA: Diagnosis present

## 2015-07-08 DIAGNOSIS — F419 Anxiety disorder, unspecified: Secondary | ICD-10-CM

## 2015-07-08 DIAGNOSIS — O99335 Smoking (tobacco) complicating the puerperium: Secondary | ICD-10-CM | POA: Insufficient documentation

## 2015-07-08 DIAGNOSIS — F17210 Nicotine dependence, cigarettes, uncomplicated: Secondary | ICD-10-CM | POA: Insufficient documentation

## 2015-07-08 DIAGNOSIS — Z98891 History of uterine scar from previous surgery: Secondary | ICD-10-CM

## 2015-07-08 MED ORDER — DIPHENHYDRAMINE HCL 25 MG PO CAPS
25.0000 mg | ORAL_CAPSULE | Freq: Four times a day (QID) | ORAL | Status: DC | PRN
  Administered 2015-07-08: 25 mg via ORAL
  Filled 2015-07-08: qty 1

## 2015-07-08 MED ORDER — KETOROLAC TROMETHAMINE 30 MG/ML IJ SOLN
30.0000 mg | Freq: Once | INTRAMUSCULAR | Status: AC
Start: 2015-07-08 — End: 2015-07-08
  Administered 2015-07-08: 30 mg via INTRAMUSCULAR
  Filled 2015-07-08: qty 1

## 2015-07-08 MED ORDER — OXYCODONE-ACETAMINOPHEN 5-325 MG PO TABS
2.0000 | ORAL_TABLET | Freq: Once | ORAL | Status: AC
Start: 2015-07-08 — End: 2015-07-08
  Administered 2015-07-08: 2 via ORAL
  Filled 2015-07-08: qty 2

## 2015-07-08 NOTE — MAU Provider Note (Signed)
History     CSN: [id number]  Arrival date and time: 07/08/15 1058   First Provider Initiated Contact with Patient 07/08/15 1129      Chief Complaint  Patient presents with  . Incisional Pain   HPI [name] 29 y.o. [id number] post 4th Repeat C/S for Cholestasis. Presents with uncontrolled incisional pain and bruising around incision. She had known bruising prior to discharge yesterday.  Past Medical History  Diagnosis Date  . Anxiety   . Depression   . Genital warts   . Anxiety 11/21/2011  . Anemia   . Complication of anesthesia     Swelling from some drug used for anesthesia. Does not  know name.  . Genital warts   . HPV test positive   . RhD negative   . Preterm labor     Past Surgical History  Procedure Laterality Date  . Cesarean section    . Dilation and curettage of uterus    . Fracture surgery Right     pinky finger  . Fractured finger  2001    open reduction  . Therapeutic abortion    . Cesarean section  2007, 2009, 2010    x3  . Cesarean section N/A 07/03/2015    Procedure: CESAREAN SECTION;  Surgeon: Levie HeritageJacob J Stinson, DO;  Location: [facility];  Service: Obstetrics;  Laterality: N/A;    Family History  Problem Relation Age of Onset  . Heart disease Father   . Hyperlipidemia Father     Social History  Substance Use Topics  . Smoking status: Current Some Day Smoker -- 0.25 packs/day for 8 years    Types: Cigarettes    Last Attempt to Quit: 02/21/2014  . Smokeless tobacco: Never Used  . Alcohol Use: No     Comment: socially    Allergies:  Allergies  Allergen Reactions  . Other Anaphylaxis    Childhood reaction, finger surgery, total body swelling to anesthesia  medication  . Latex Itching and Swelling    Prescriptions prior to admission  Medication Sig Dispense Refill Last Dose  . ferrous sulfate 325 (65 FE) MG tablet Take 1 tablet (325 mg total) by mouth daily. 90 tablet 3 07/07/2015 at Unknown time  . ibuprofen (ADVIL,MOTRIN) 600 MG  tablet Take 1 tablet (600 mg total) by mouth every 6 (six) hours as needed. 90 tablet 0 07/08/2015 at Unknown time  . oxyCODONE-acetaminophen (PERCOCET/ROXICET) 5-325 MG tablet Take 1 tablet by mouth every 4 (four) hours as needed (for pain scale 4-7). 45 tablet 0 07/08/2015 at Unknown time  . FLUoxetine (PROZAC) 20 MG capsule Take 1 capsule (20 mg total) by mouth daily. (Patient not taking: Reported on 07/08/2015) 90 capsule 0     Review of Systems  Eyes: Positive for blurred vision.  Gastrointestinal: Positive for abdominal pain.       Incisional pain; abdominal bruising   Physical Exam   Blood pressure 113/68, pulse 65, temperature 97.9 F (36.6 C), temperature source Oral, resp. rate 18, height 4\' 11"  (1.499 m), weight 67.132 kg (148 lb), unknown if currently breastfeeding.  Physical Exam  MAU Course  Procedures  MDM  CNM Clemmons consulted me to come see the patient's incision.  Patient has multiple complaints  - Most concerning for patient is her pain- she took her ibuprofen at 2000 last night and again this AM.  She  swelling in legs, inability to sleep, poor appetite (but she is able to eat and keep down food), blurry vision.  VS are wnl Gen: tearful patient.  Lungs: normal WOB Abd: Erythematous rash over upper abdomen. Slight warmth over rash but not incision. Hematoma on over pannus. TTP diffusely over lower abdomen. Incision is clean/dry and intact.  Ext: warm, well perfusion. Mild non-pitting edema.   Assessment: normal post-operative state with poor pain control Plan: Will give patient toradol, percocet and benadryl (for rash) and recommended discharge home. Spent > 15 minutes reassuring the patient.   12:14 PM  Federico Flake, MD  Assessment and Plan  Incisional Pain  Discharge to home.  Clemmons,Lori Grissett 07/08/2015, 11:32 AM

## 2015-07-08 NOTE — Discharge Instructions (Signed)
Pain Medicine Instructions °HOW CAN PAIN MEDICINE AFFECT ME? °You were given a prescription for pain medicine. This medicine may make you tired or drowsy and may affect your ability to think clearly. Pain medicine may also affect your ability to drive or perform certain physical activities. It may not be possible to make all of your pain go away, but you should be comfortable enough to move, breathe, and take care of yourself. °HOW OFTEN SHOULD I TAKE PAIN MEDICINE AND HOW MUCH SHOULD I TAKE? °Take pain medicine only as directed by your health care provider and only as needed for pain. °You do not need to take pain medicine if you are not having pain, unless directed by your health care provider. °You can take less than the prescribed dose if you find that a smaller amount of medicine controls your pain. °WHAT RESTRICTIONS DO I HAVE WHILE TAKING PAIN MEDICINE? °Follow these instructions after you start taking pain medicine, while you are taking the medicine, and for 8 hours after you stop taking the medicine: °Do not drive. °Do not operate machinery. °Do not operate power tools. °Do not sign legal documents. °Do not drink alcohol. °Do not take sleeping pills. °Do not supervise children by yourself. °Do not participate in activities that require climbing or being in high places. °Do not enter a body of water--such as a lake, river, ocean, spa, or swimming pool--without an adult nearby who can monitor and help you. °HOW CAN I KEEP OTHERS SAFE WHILE I AM TAKING PAIN MEDICINE? °Store your pain medicine as directed by your health care provider. Make sure that it is placed where children and pets cannot reach it. °Never share your pain medicine with anyone. °Do not save any leftover pills. If you have any leftover pain medicine, get rid of it or destroy it as directed by your health care provider. °WHAT ELSE DO I NEED TO KNOW ABOUT TAKING PAIN MEDICINE? °Use a stool softener if you become constipated from your pain  medicine. Increasing your intake of fruits and vegetables will also help with constipation. °Write down the times when you take your pain medicine. Look at the times before you take your next dose of medicine. It is easy to become confused while on pain medicine. Recording the times helps you to avoid an overdose. °If your pain is severe, do not try to treat it yourself by taking more pills than instructed on your prescription. Contact your health care provider for help. °You may have been prescribed a pain medicine that contains acetaminophen. Do not take any other acetaminophen while taking this medicine. An overdose of acetaminophen can result in severe liver damage. Acetaminophen is found in many over-the-counter (OTC) and prescription medicines. If you are taking any medicines in addition to your pain medicine, check the active ingredients on those medicines to see if acetaminophen is listed. °WHEN SHOULD I CALL MY HEALTH CARE PROVIDER? °Your medicine is not helping to make the pain go away. °You vomit or have diarrhea shortly after taking the medicine. °You develop new pain in areas that did not hurt before. °You have an allergic reaction to your medicine. This may include: °Itchiness. °Swelling. °Dizziness. °Developing a new rash. °WHEN SHOULD I CALL 911 OR GO TO THE EMERGENCY ROOM? °You feel dizzy or you faint. °You are very confused or disoriented. °You repeatedly vomit. °Your skin or lips turn pale or bluish in color. °You have shortness of breath or you are breathing much more slowly than usual. °You have   a severe allergic reaction to your medicine. This includes: °Developing tongue swelling. °Having difficulty breathing. °  °This information is not intended to replace advice given to you by your health care provider. Make sure you discuss any questions you have with your health care provider. °  °Document Released: 10/28/2000 Document Revised: 12/06/2014 Document Reviewed: 05/26/2014 °Elsevier  Interactive Patient Education ©2016 Elsevier Inc. ° °

## 2015-07-08 NOTE — MAU Note (Signed)
Patient presents post cesarean section from 11/28 with c/o incisional pain since being discharged yesterday. States the medication she was given in the hospital controlled the pain but was prescribed percocet for discharge and it is not as effective although milder. Also c/o blurred vision since 2 days postop. Was told that a topical medication for nausea was causing the blurry vision. Patient has taken ibuprofen and percocet twice since discharge.

## 2015-07-11 ENCOUNTER — Telehealth: Payer: Self-pay | Admitting: *Deleted

## 2015-07-11 ENCOUNTER — Ambulatory Visit (INDEPENDENT_AMBULATORY_CARE_PROVIDER_SITE_OTHER): Payer: Medicaid Other | Admitting: Advanced Practice Midwife

## 2015-07-11 ENCOUNTER — Encounter: Payer: Self-pay | Admitting: Advanced Practice Midwife

## 2015-07-11 VITALS — BP 108/68 | HR 61 | Temp 98.8°F | Ht 59.0 in | Wt 138.0 lb

## 2015-07-11 DIAGNOSIS — R21 Rash and other nonspecific skin eruption: Secondary | ICD-10-CM | POA: Diagnosis not present

## 2015-07-11 MED ORDER — HYDROCORTISONE 1 % EX LOTN: 1.0000 "application " | TOPICAL_LOTION | Freq: Two times a day (BID) | CUTANEOUS | Status: DC

## 2015-07-11 MED ORDER — METHYLPREDNISOLONE 4 MG PO TBPK: ORAL_TABLET | ORAL | Status: DC

## 2015-07-11 NOTE — Telephone Encounter (Signed)
[name] called and left a voice message this am that she has concerns about her cholestasis she had during pregnancy. States had her baby about a week ago and it has gotten worse.   Called [name] and she states they told her it would get better, but it hasn't. States she had a little rash before she had the baby on her abdomen, but now it has gotten worse. States it is all over her abdomen and on her breasts. States it is like blisters. States yesterday when took her baby to pediatrician she showed it to him and he said she should get her doctor to check it.   Offered her an appointment which she accepted for 2:15 today as a work in appointment.

## 2015-07-11 NOTE — Patient Instructions (Signed)

## 2015-07-11 NOTE — Progress Notes (Signed)
Chief Complaint:  Rash   HPI [name] is a 29 y.o. [phone] who presents to clinic reporting rash on her abdomen and spreading to her chest and neck.  Started a few days before delivery, got worse 2 days after delivery.  She is about a week postop.  Had cholestasis with end of pregnancy.  States was told rash is from that. Denies new exposure to lotions or detergents.  She reports normal postpartum vaginal bleeding, no vaginal itching/burning, urinary symptoms, h/a, dizziness, n/v, or fever/chills  Past Medical History: Past Medical History  Diagnosis Date  . Anxiety   . Depression   . Genital warts   . Anxiety 11/21/2011  . Anemia   . Complication of anesthesia     Swelling from some drug used for anesthesia. Does not  know name.  . Genital warts   . HPV test positive   . RhD negative   . Preterm labor     Past obstetric history: OB History  Gravida Para Term Preterm AB SAB TAB Ectopic Multiple Living  0 0 0 4    # Outcome Date GA Lbr Len/2nd Weight Sex Delivery Anes PTL Lv  8 Term 07/03/15 [redacted]w[redacted]d  2.79 kg (6 lb 2.4 oz) F CS-LTranv Spinal  Y  7 Term 05/06/09 [redacted]w[redacted]d  2.92 kg (6 lb 7 oz) M CS-LTranv  Y Y  6 Term 08/24/07 [redacted]w[redacted]d  3.062 kg (6 lb 12 oz) F CS-LTranv  Y Y  5 Term 01/18/06 [redacted]w[redacted]d  2.977 kg (6 lb 9 oz) F CS-LTranv EPI Y Y     Complications: Fetal Intolerance  4 SAB           3 SAB           2 SAB           1 TAB               Past Surgical History: Past Surgical History  Procedure Laterality Date  . Cesarean section    . Dilation and curettage of uterus    . Fracture surgery Right     pinky finger  . Fractured finger  2001    open reduction  . Therapeutic abortion    . Cesarean section  2007, 2009, 2010    x3  . Cesarean section N/A 07/03/2015    Procedure: CESAREAN SECTION;  Surgeon: Levie Heritage, DO;  Location: [facility];  Service: Obstetrics;  Laterality: N/A;    Family History: Family History  Problem Relation Age of Onset  . Heart  disease Father   . Hyperlipidemia Father     Social History: Social History  Substance Use Topics  . Smoking status: Current Some Day Smoker -- 0.25 packs/day for 8 years    Types: Cigarettes    Last Attempt to Quit: 02/21/2014  . Smokeless tobacco: Never Used  . Alcohol Use: No     Comment: socially    Allergies:  Allergies  Allergen Reactions  . Other Anaphylaxis    Childhood reaction, finger surgery, total body swelling to anesthesia  medication  . Latex Itching and Swelling    Meds:  (Not in a hospital admission)  ROS:  Review of Systems See HPI. Denies nausea/vomiting.  Reports incisional pain and rash with itching. Breastfeeding going well.   I have reviewed patient's Past Medical Hx, Surgical Hx, Family Hx, Social Hx, medications and allergies.   Physical Exam   Filed Vitals:  07/11/15 1449  BP: 108/68  Pulse: 61  Temp: 98.8 F (37.1 C)    Constitutional: Well-developed, well-nourished female in no acute distress.  Cardiovascular: normal rate and rhythm Respiratory: normal effort, no distress.  GI: Abd soft, non-tender.  Nondistended.  No rebound, No guarding.  Bowel Sounds audible       There is a red maculopapular rash on abdomen, lower chest, and back of neck.  MS: Extremities nontender, no edema, normal ROM Neurologic: Alert and oriented x 4.   Grossly nonfocal. GU: Neg CVAT. Skin:  Warm and Dry, rash (see picture) Psych:  Affect appropriate.  Labs: No results found for this or any previous visit (from the past 24 hour(s)). --/--/O NEG (11/29 0610)  Imaging:  No results found.    MAU Course/MDM:  Consult Dr Shawnie PonsPratt, who states this is most likely contact dermatitis.    Recommend medrol dosepack.  Pt stable at time of discharge.  Assessment: Rash, undetermined origin Postoperative C/S  Plan: Discharge home Recommend cortisone lotion as prescribed Rx sent for Medrol Dose Pack for rash    Medication List       This list is  accurate as of: 07/11/15  3:06 PM.  Always use your most recent med list.               ALDARA 5 % cream  Generic drug:  imiquimod  APPLY TOPICALLY THREE TIMES A WEEK     ferrous sulfate 325 (65 FE) MG tablet  Take 1 tablet (325 mg total) by mouth daily.     FLUoxetine 20 MG capsule  Commonly known as:  PROZAC  Take 1 capsule (20 mg total) by mouth daily.     ibuprofen 600 MG tablet  Commonly known as:  ADVIL,MOTRIN  Take 1 tablet (600 mg total) by mouth every 6 (six) hours as needed.     oxyCODONE-acetaminophen 5-325 MG tablet  Commonly known as:  PERCOCET/ROXICET  Take 1 tablet by mouth every 4 (four) hours as needed (for pain scale 4-7).       Encouraged to return here or to other Urgent Care/ED if she develops worsening of symptoms, increase in pain, fever, or other concerning symptoms.   Wynelle BourgeoisMarie Constantino Starace CNM, MSN Certified Nurse-Midwife 07/11/2015 3:06 PM

## 2015-07-11 NOTE — Progress Notes (Signed)
Here for c/o rash that has gotten worse on her abdomen, hx cholestasis. Also c/o both hands- worse on right- states fingers swolllen, feel numb started yesterday.

## 2015-07-14 ENCOUNTER — Other Ambulatory Visit (HOSPITAL_COMMUNITY): Payer: Medicaid Other

## 2015-07-17 ENCOUNTER — Inpatient Hospital Stay (HOSPITAL_COMMUNITY): Admission: RE | Admit: 2015-07-17 | Payer: Medicaid Other | Source: Ambulatory Visit | Admitting: Family Medicine

## 2015-07-17 ENCOUNTER — Encounter (HOSPITAL_COMMUNITY): Admission: RE | Payer: Self-pay | Source: Ambulatory Visit

## 2015-07-17 SURGERY — Surgical Case
Anesthesia: Regional | Site: Abdomen

## 2015-08-06 DIAGNOSIS — Z8614 Personal history of Methicillin resistant Staphylococcus aureus infection: Secondary | ICD-10-CM

## 2015-08-06 HISTORY — DX: Personal history of Methicillin resistant Staphylococcus aureus infection: Z86.14

## 2015-08-10 ENCOUNTER — Ambulatory Visit: Payer: Medicaid Other | Admitting: Family Medicine

## 2015-09-05 ENCOUNTER — Other Ambulatory Visit: Payer: Self-pay | Admitting: Internal Medicine

## 2015-09-21 ENCOUNTER — Ambulatory Visit: Payer: Medicaid Other | Admitting: Obstetrics & Gynecology

## 2015-10-11 ENCOUNTER — Inpatient Hospital Stay (HOSPITAL_COMMUNITY)
Admission: AD | Admit: 2015-10-11 | Discharge: 2015-10-11 | Disposition: A | Discharge status: Home / Self Care | Payer: Self-pay | Source: Ambulatory Visit | Attending: Obstetrics & Gynecology | Admitting: Obstetrics & Gynecology

## 2015-10-11 ENCOUNTER — Inpatient Hospital Stay (HOSPITAL_COMMUNITY): Payer: Self-pay

## 2015-10-11 ENCOUNTER — Encounter (HOSPITAL_COMMUNITY): Payer: Self-pay | Admitting: *Deleted

## 2015-10-11 DIAGNOSIS — N852 Hypertrophy of uterus: Secondary | ICD-10-CM | POA: Insufficient documentation

## 2015-10-11 DIAGNOSIS — K439 Ventral hernia without obstruction or gangrene: Secondary | ICD-10-CM | POA: Insufficient documentation

## 2015-10-11 DIAGNOSIS — R109 Unspecified abdominal pain: Secondary | ICD-10-CM | POA: Insufficient documentation

## 2015-10-11 DIAGNOSIS — F329 Major depressive disorder, single episode, unspecified: Secondary | ICD-10-CM | POA: Insufficient documentation

## 2015-10-11 DIAGNOSIS — F17210 Nicotine dependence, cigarettes, uncomplicated: Secondary | ICD-10-CM | POA: Insufficient documentation

## 2015-10-11 HISTORY — DX: Other specified diseases and conditions complicating pregnancy, childbirth and the puerperium: O99.89

## 2015-10-11 HISTORY — DX: Other specified diseases and conditions complicating pregnancy: O99.891

## 2015-10-11 LAB — CBC
HCT: 39.4 % (ref 36.0–46.0)
HEMOGLOBIN: 13.2 g/dL (ref 12.0–15.0)
MCH: 29.9 pg (ref 26.0–34.0)
MCHC: 33.5 g/dL (ref 30.0–36.0)
MCV: 89.1 fL (ref 78.0–100.0)
Platelets: 352 10*3/uL (ref 150–400)
RBC: 4.42 MIL/uL (ref 3.87–5.11)
RDW: 16 % — ABNORMAL HIGH (ref 11.5–15.5)
WBC: 10.6 10*3/uL — ABNORMAL HIGH (ref 4.0–10.5)

## 2015-10-11 LAB — URINALYSIS, ROUTINE W REFLEX MICROSCOPIC
Bilirubin Urine: NEGATIVE
GLUCOSE, UA: NEGATIVE mg/dL
HGB URINE DIPSTICK: NEGATIVE
Ketones, ur: NEGATIVE mg/dL
Leukocytes, UA: NEGATIVE
Nitrite: NEGATIVE
PH: 5 (ref 5.0–8.0)
PROTEIN: NEGATIVE mg/dL
Specific Gravity, Urine: >1.03 — ABNORMAL HIGH (ref 1.005–1.030)

## 2015-10-11 LAB — POCT PREGNANCY, URINE: Preg Test, Ur: NEGATIVE

## 2015-10-11 MED ORDER — IOHEXOL 300 MG/ML  SOLN: 50.0000 mL | INTRAMUSCULAR | Status: AC

## 2015-10-11 MED ORDER — IOHEXOL 300 MG/ML  SOLN
100.0000 mL | Freq: Once | INTRAMUSCULAR | Status: AC | PRN
  Administered 2015-10-11: 100 mL via INTRAVENOUS

## 2015-10-11 NOTE — MAU Note (Addendum)
Pt had a repeat C/S on 07-02-16 with a tubal ligation.  Pt states her abd has been hurting at incision site.  Pt states her abd is hard and painful all over when touching it. Rates pain a zero at rest.   Pt states she doesn't understand why her belly continues to get bigger.  Irregular heavy periods since delivery.

## 2015-10-11 NOTE — Discharge Instructions (Signed)
Hernia, Adult A hernia is the bulging of an organ or tissue through a weak spot in the muscles of the abdomen (abdominal wall). Hernias develop most often near the navel or groin. There are many kinds of hernias. Common kinds include:  Femoral hernia. This kind of hernia develops under the groin in the upper thigh area.  Inguinal hernia. This kind of hernia develops in the groin or scrotum.  Umbilical hernia. This kind of hernia develops near the navel.  Hiatal hernia. This kind of hernia causes part of the stomach to be pushed up into the chest.  Incisional hernia. This kind of hernia bulges through a scar from an abdominal surgery. CAUSES This condition may be caused by:  Heavy lifting.  Coughing over a long period of time.  Straining to have a bowel movement.  An incision made during an abdominal surgery.  A birth defect (congenital defect).  Excess weight or obesity.  Smoking.  Poor nutrition.  Cystic fibrosis.  Excess fluid in the abdomen.  Undescended testicles. SYMPTOMS Symptoms of a hernia include:  A lump on the abdomen. This is the first sign of a hernia. The lump may become more obvious with standing, straining, or coughing. It may get bigger over time if it is not treated or if the condition causing it is not treated.  Pain. A hernia is usually painless, but it may become painful over time if treatment is delayed. The pain is usually dull and may get worse with standing or lifting heavy objects. Sometimes a hernia gets tightly squeezed in the weak spot (strangulated) or stuck there (incarcerated) and causes additional symptoms. These symptoms may include:  Vomiting.  Nausea.  Constipation.  Irritability. DIAGNOSIS A hernia may be diagnosed with:  A physical exam. During the exam your health care provider may ask you to cough or to make a specific movement, because a hernia is usually more visible when you move.  Imaging tests. These can  include:  X-rays.  Ultrasound.  CT scan. TREATMENT A hernia that is small and painless may not need to be treated. A hernia that is large or painful may be treated with surgery. Inguinal hernias may be treated with surgery to prevent incarceration or strangulation. Strangulated hernias are always treated with surgery, because lack of blood to the trapped organ or tissue can cause it to die. Surgery to treat a hernia involves pushing the bulge back into place and repairing the weak part of the abdomen. HOME CARE INSTRUCTIONS  Avoid straining.  Do not lift anything heavier than 10 lb (4.5 kg).  Lift with your leg muscles, not your back muscles. This helps avoid strain.  When coughing, try to cough gently.  Prevent constipation. Constipation leads to straining with bowel movements, which can make a hernia worse or cause a hernia repair to break down. You can prevent constipation by:  Eating a high-fiber diet that includes plenty of fruits and vegetables.  Drinking enough fluids to keep your urine clear or pale yellow. Aim to drink 6-8 glasses of water per day.  Using a stool softener as directed by your health care provider.  Lose weight, if you are overweight.  Do not use any tobacco products, including cigarettes, chewing tobacco, or electronic cigarettes. If you need help quitting, ask your health care provider.  Keep all follow-up visits as directed by your health care provider. This is important. Your health care provider may need to monitor your condition. SEEK MEDICAL CARE IF:  You have   swelling, redness, and pain in the affected area.  Your bowel habits change. SEEK IMMEDIATE MEDICAL CARE IF:  You have a fever.  You have abdominal pain that is getting worse.  You feel nauseous or you vomit.  You cannot push the hernia back in place by gently pressing on it while you are lying down.  The hernia:  Changes in shape or size.  Is stuck outside the  abdomen.  Becomes discolored.  Feels hard or tender.   This information is not intended to replace advice given to you by your health care provider. Make sure you discuss any questions you have with your health care provider.   Document Released: 07/22/2005 Document Revised: 08/12/2014 Document Reviewed: 06/01/2014 Elsevier Interactive Patient Education 2016 [employer] Inc.  

## 2015-10-11 NOTE — MAU Provider Note (Signed)
History     CSN: [id number]  Arrival date and time: 10/11/15 1223   First Provider Initiated Contact with Patient 10/11/15 1259       Chief Complaint  Patient presents with  . Abdominal Pain   HPI  [name] is a 30 y.o. [id number] who presents 3 months s/p cesarean section & BTL for abdominal pain. Patient states she has had intermittent abdominal & incisional pain since her delivery in November. Also reports that her uterus has been enlarged since delivery & that it feels firm. No showed to 2 postpartum visits in clinic d/t transportation issues. Currently in no pain.  Denies fever, vaginal bleeding, urinary complaints, n/v/d, or constipation.     OB History    Gravida Para Term Preterm AB TAB SAB Ectopic Multiple Living   0 0 0 4      Past Medical History  Diagnosis Date  . Anxiety   . Depression   . Genital warts   . Anxiety 11/21/2011  . Anemia   . Complication of anesthesia     Swelling from some drug used for anesthesia. Does not  know name.  . Genital warts   . HPV test positive   . RhD negative   . Preterm labor   . Blood transfusion complicating pregnancy     Past Surgical History  Procedure Laterality Date  . Cesarean section    . Dilation and curettage of uterus    . Fracture surgery Right     pinky finger  . Fractured finger  2001    open reduction  . Therapeutic abortion    . Cesarean section  2007, 2009, 2010    x3  . Cesarean section N/A 07/03/2015    Procedure: CESAREAN SECTION;  Surgeon: Levie Heritage, DO;  Location: [facility];  Service: Obstetrics;  Laterality: N/A;    Family History  Problem Relation Age of Onset  . Heart disease Father   . Hyperlipidemia Father     Social History  Substance Use Topics  . Smoking status: Current Some Day Smoker -- 0.50 packs/day for 8 years    Types: Cigarettes    Last Attempt to Quit: 02/21/2014  . Smokeless tobacco: Never Used  . Alcohol Use: No     Comment: socially     Allergies:  Allergies  Allergen Reactions  . Other Anaphylaxis    Childhood reaction, finger surgery, total body swelling to anesthesia  medication  . Latex Itching and Swelling    Prescriptions prior to admission  Medication Sig Dispense Refill Last Dose  . ALDARA 5 % cream APPLY TOPICALLY THREE TIMES A WEEK  0 Not Taking  . ferrous sulfate 325 (65 FE) MG tablet Take 1 tablet (325 mg total) by mouth daily. 90 tablet 3 Taking  . FLUoxetine (PROZAC) 20 MG capsule Take 1 capsule (20 mg total) by mouth daily. 90 capsule 0 Taking  . hydrocortisone 1 % lotion Apply 1 application topically 2 (two) times daily. 118 mL 0   . ibuprofen (ADVIL,MOTRIN) 600 MG tablet Take 1 tablet (600 mg total) by mouth every 6 (six) hours as needed. 90 tablet 0 Taking  . methylPREDNISolone (MEDROL DOSEPAK) 4 MG TBPK tablet Take as directed on pack 21 tablet 0   . oxyCODONE-acetaminophen (PERCOCET/ROXICET) 5-325 MG tablet Take 1 tablet by mouth every 4 (four) hours as needed (for pain scale 4-7). 45 tablet 0 Taking    Review of Systems  Constitutional:  Negative.   Gastrointestinal: Positive for abdominal pain. Negative for nausea, vomiting, diarrhea and constipation.  Genitourinary: Negative.    Physical Exam   Blood pressure 91/48, pulse 72, temperature 98.5 F (36.9 C), temperature source Oral, resp. rate 18, last menstrual period 09/10/2015, SpO2 100 %, not currently breastfeeding.  Physical Exam  Nursing note and vitals reviewed. Constitutional: She is oriented to person, place, and time. She appears well-developed and well-nourished. No distress.  HENT:  Head: Normocephalic and atraumatic.  Eyes: Conjunctivae are normal. Right eye exhibits no discharge. Left eye exhibits no discharge. No scleral icterus.  Neck: Normal range of motion.  Cardiovascular: Normal rate, regular rhythm and normal heart sounds.   No murmur heard. Respiratory: Effort normal and breath sounds normal. No respiratory  distress. She has no wheezes.  GI: Soft. Bowel sounds are normal. She exhibits distension. There is tenderness in the periumbilical area and suprapubic area. There is no rebound and no guarding.  Genitourinary: Uterus is enlarged. Cervix exhibits no motion tenderness.  Neurological: She is alert and oriented to person, place, and time.  Skin: Skin is warm and dry. She is not diaphoretic.  Psychiatric: She has a normal mood and affect. Her behavior is normal. Judgment and thought content normal.    MAU Course  Procedures Results for orders placed or performed during the hospital encounter of 10/11/15 (from the past 24 hour(s))  Urinalysis, Routine w reflex microscopic (not at [facility])     Status: Abnormal   Collection Time: 10/11/15 12:34 PM  Result Value Ref Range   Color, Urine YELLOW YELLOW   APPearance CLEAR CLEAR   Specific Gravity, Urine >1.030 (H) 1.005 - 1.030   pH 5.0 5.0 - 8.0   Glucose, UA NEGATIVE NEGATIVE mg/dL   Hgb urine dipstick NEGATIVE NEGATIVE   Bilirubin Urine NEGATIVE NEGATIVE   Ketones, ur NEGATIVE NEGATIVE mg/dL   Protein, ur NEGATIVE NEGATIVE mg/dL   Nitrite NEGATIVE NEGATIVE   Leukocytes, UA NEGATIVE NEGATIVE  Pregnancy, urine POC     Status: None   Collection Time: 10/11/15 12:43 PM  Result Value Ref Range   Preg Test, Ur NEGATIVE NEGATIVE  CBC     Status: Abnormal   Collection Time: 10/11/15  1:21 PM  Result Value Ref Range   WBC 10.6 (H) 4.0 - 10.5 K/uL   RBC 4.42 3.87 - 5.11 MIL/uL   Hemoglobin 13.2 12.0 - 15.0 g/dL   HCT 16.139.4 09.636.0 - 04.546.0 %   MCV 89.1 78.0 - 100.0 fL   MCH 29.9 26.0 - 34.0 pg   MCHC 33.5 30.0 - 36.0 g/dL   RDW 40.916.0 (H) 81.111.5 - 91.415.5 %   Platelets 352 150 - 400 K/uL   Ct Abdomen Pelvis W Contrast  10/11/2015  CLINICAL DATA:  Abdominal pain 3 months after a C-section, enlarged uterus EXAM: CT ABDOMEN AND PELVIS WITH CONTRAST TECHNIQUE: Multidetector CT imaging of the abdomen and pelvis was performed using the standard protocol following  bolus administration of intravenous contrast. CONTRAST:  100mL OMNIPAQUE IOHEXOL 300 MG/ML  SOLN COMPARISON:  CT scan report 06/14/1999 no images available. FINDINGS: Lower chest: Lung bases are unremarkable. No pleural effusion. Heart size within normal limits. Hepatobiliary: No focal hepatic mass. No intrahepatic biliary ductal dilatation. No calcified gallstones are noted within gallbladder. Pancreas: No focal mass or peripancreatic fluid Spleen: Normal in size in appearance. Adrenals/Urinary Tract: No adrenal gland mass is noted. Kidneys are symmetrical in size and enhancement. No nephrolithiasis. Delayed renal images shows bilateral renal  symmetrical excretion. Bilateral visualized ureter is unremarkable. The urinary bladder is unremarkable on delayed images. Contrast material/urine is noted within urinary bladder. No bladder filling defects. No bladder extravasation. Stomach/Bowel: There is no small bowel obstruction. No gastric outlet obstruction. Oral contrast material was given to the patient. No thickened or dilated small bowel loops are noted. There is no pericecal inflammation. Normal appendix is noted in axial image 40. Terminal ileum is unremarkable. Vascular/Lymphatic: No aortic aneurysm. No retroperitoneal or mesenteric adenopathy. Reproductive: There is mild enlarged anteflexed uterus. The uterus measures 10.4 x 4.4 by 7.4 cm. The patient has history of multiple C-sections. Please note the anterior fundus of the uterus is in close proximity with deep anterior abdominal wall fascia please see axial images 43 and 44. Fibrotic adhesions at this level cannot be excluded. Clinical correlation necessary please see sagittal image 62. Post tubal ligation surgical clips are noted. Scarring is noted anterior fundal wall probable post C-section. No adnexal masses noted. Other: There is a tiny umbilical hernia containing fat without evidence of acute complication. There is a midline infraumbilical anterior  abdominal wall ventral hernia with separation of rectus muscles. The hernia is containing omental fat. The hernia measures at least 8.4 cm transverse diameter by 3 cm AP diameter. The hernia is extending about 7 cm cranial caudally infraumbilical region please see sagittal image 53. There is no evidence of acute inflammation of fat necrosis or within hernia. No fluid is noted within hernia. Partially fatty replacement of the right rectus muscle. Musculoskeletal: No destructive bony lesions are noted. Mild disc space flattening with mild disc bulge at L5-S1 level. IMPRESSION: 1. There is mild enlarged anteflexed uterus. The uterus measures 10.4 x 4.4 by 7.4 cm. The patient has history of multiple C-sections and there is scarring of uterine wall . Please note the anterior fundus of the uterus is in close proximity with deep anterior abdominal wall fascia please see axial images 43 and 44. Fibrotic adhesions at this level cannot be excluded. Clinical correlation necessary please see sagittal image 62. 2. There is a tiny umbilical hernia containing fat without evidence of acute complication. There is a midline infraumbilical anterior abdominal wall ventral hernia with separation of rectus muscles. The hernia is containing omental fat. The hernia measures at least 8.4 cm transverse diameter by 3 cm AP diameter. The hernia is extending about 7 cm cranial caudally infraumbilical region please see sagittal image 53. There is no evidence of acute inflammation of fat necrosis or within hernia. 3. Post tubal ligation surgical clips are noted. 4. No small bowel obstruction. 5. Normal appendix.  No pericecal inflammation. 6. No hydronephrosis or hydroureter. Bilateral renal symmetrical excretion. Unremarkable urinary bladder. Electronically Signed   By: Natasha Mead M.D.   On: 10/11/2015 15:16    MDM UPT negative Uterus feels enlarged per bimanual exam - imaging ordered CT shows large abdominal hernia Discussed CT results  with Dr. Macon Large. Will order referral to general surgery & send message to the clinic for scheduling. Ok to discharge home.   Assessment and Plan  A: 1. Hernia of anterior abdominal wall   2. Enlarged uterus   3. Abdominal pain     P: Discharge home Ambulatory referral to general surgery - message sent to [facility] to be scheduled Avoid heavy lifting Discussed reasons to return to ED  Follow-up Information    Follow up with [facility].   Specialty:  General Surgery   Why:  Clinic should call you with appointment  for referral to this office   Contact information:   [address] [place] [address] [phone]       Follow up with [facility]   Specialty:  Emergency Medicine   Why:  As needed, if symptoms worsen   Contact information:   [address] [id number] [place] [id number] [phone]       Judeth Horn 10/11/2015, 12:59 PM

## 2015-10-13 ENCOUNTER — Telehealth: Payer: Self-pay | Admitting: Obstetrics & Gynecology

## 2015-10-13 NOTE — Telephone Encounter (Signed)
Patient need a call back because she need [facility] about [facility] Hern[facility]

## 2015-10-17 NOTE — Telephone Encounter (Signed)
Called patient and she states that she has an appt tomorrow with [facility] and they have already got in touch with her. Patient asked about the abdominal pain, nausea and diarrhea she has been having similar to her ER visit. Told patient to mention these things at her appt with [facility]. Patient verbalized understanding & had no other questions

## 2015-11-29 ENCOUNTER — Emergency Department (HOSPITAL_BASED_OUTPATIENT_CLINIC_OR_DEPARTMENT_OTHER): Payer: Self-pay

## 2015-11-29 ENCOUNTER — Emergency Department (HOSPITAL_BASED_OUTPATIENT_CLINIC_OR_DEPARTMENT_OTHER)
Admission: EM | Admit: 2015-11-29 | Discharge: 2015-11-29 | Discharge status: Against Medical Advice | Payer: Self-pay | Attending: Emergency Medicine | Admitting: Emergency Medicine

## 2015-11-29 ENCOUNTER — Encounter (HOSPITAL_BASED_OUTPATIENT_CLINIC_OR_DEPARTMENT_OTHER): Payer: Self-pay | Admitting: *Deleted

## 2015-11-29 ENCOUNTER — Emergency Department (HOSPITAL_BASED_OUTPATIENT_CLINIC_OR_DEPARTMENT_OTHER)
Admission: EM | Admit: 2015-11-29 | Discharge: 2015-11-29 | Disposition: A | Discharge status: Home / Self Care | Payer: Medicaid Other | Attending: Dermatology | Admitting: Dermatology

## 2015-11-29 DIAGNOSIS — R1084 Generalized abdominal pain: Secondary | ICD-10-CM | POA: Insufficient documentation

## 2015-11-29 DIAGNOSIS — F17210 Nicotine dependence, cigarettes, uncomplicated: Secondary | ICD-10-CM | POA: Insufficient documentation

## 2015-11-29 DIAGNOSIS — Z5321 Procedure and treatment not carried out due to patient leaving prior to being seen by health care provider: Secondary | ICD-10-CM | POA: Insufficient documentation

## 2015-11-29 DIAGNOSIS — F329 Major depressive disorder, single episode, unspecified: Secondary | ICD-10-CM | POA: Insufficient documentation

## 2015-11-29 LAB — COMPREHENSIVE METABOLIC PANEL
ALT: 13 U/L — ABNORMAL LOW (ref 14–54)
AST: 18 U/L (ref 15–41)
Albumin: 4.3 g/dL (ref 3.5–5.0)
Alkaline Phosphatase: 53 U/L (ref 38–126)
Anion gap: 4 — ABNORMAL LOW (ref 5–15)
BUN: 10 mg/dL (ref 6–20)
CO2: 24 mmol/L (ref 22–32)
Calcium: 8.6 mg/dL — ABNORMAL LOW (ref 8.9–10.3)
Chloride: 106 mmol/L (ref 101–111)
Creatinine, Ser: 0.73 mg/dL (ref 0.44–1.00)
GFR calc Af Amer: >60 mL/min (ref >60)
GFR calc non Af Amer: >60 mL/min (ref >60)
Glucose, Bld: 78 mg/dL (ref 65–99)
Potassium: 3.4 mmol/L — ABNORMAL LOW (ref 3.5–5.1)
Sodium: 134 mmol/L — ABNORMAL LOW (ref 135–145)
Total Bilirubin: 0.4 mg/dL (ref 0.3–1.2)
Total Protein: 7.1 g/dL (ref 6.5–8.1)

## 2015-11-29 LAB — CBC WITH DIFFERENTIAL/PLATELET
Basophils Absolute: 0 10*3/uL (ref 0.0–0.1)
Basophils Relative: 0 %
Eosinophils Absolute: 0.3 10*3/uL (ref 0.0–0.7)
Eosinophils Relative: 2 %
HCT: 39.2 % (ref 36.0–46.0)
Hemoglobin: 13.5 g/dL (ref 12.0–15.0)
Lymphocytes Relative: 30 %
Lymphs Abs: 3.4 10*3/uL (ref 0.7–4.0)
MCH: 30.8 pg (ref 26.0–34.0)
MCHC: 34.4 g/dL (ref 30.0–36.0)
MCV: 89.3 fL (ref 78.0–100.0)
Monocytes Absolute: 0.9 10*3/uL (ref 0.1–1.0)
Monocytes Relative: 8 %
Neutro Abs: 6.8 10*3/uL (ref 1.7–7.7)
Neutrophils Relative %: 60 %
Platelets: 348 10*3/uL (ref 150–400)
RBC: 4.39 MIL/uL (ref 3.87–5.11)
RDW: 12.7 % (ref 11.5–15.5)
WBC: 11.4 10*3/uL — ABNORMAL HIGH (ref 4.0–10.5)

## 2015-11-29 LAB — URINALYSIS, ROUTINE W REFLEX MICROSCOPIC
Glucose, UA: NEGATIVE mg/dL
HGB URINE DIPSTICK: NEGATIVE
KETONES UR: 15 mg/dL — AB
Leukocytes, UA: NEGATIVE
NITRITE: NEGATIVE
Protein, ur: NEGATIVE mg/dL
SPECIFIC GRAVITY, URINE: 1.024 (ref 1.005–1.030)
pH: 6 (ref 5.0–8.0)

## 2015-11-29 LAB — PREGNANCY, URINE: PREG TEST UR: NEGATIVE

## 2015-11-29 MED ORDER — IOPAMIDOL (ISOVUE-300) INJECTION 61%: 100.0000 mL | Freq: Once | INTRAVENOUS | Status: DC | PRN

## 2015-11-29 NOTE — ED Notes (Signed)
Pt states she has to leave. Pt left after triage.

## 2015-11-29 NOTE — ED Notes (Signed)
MD at bedside discussing importance of receiving scan and possible medical treatments. Pt verbalizes understanding of the risks of leaving the hospital at this time. States she plans to return today for further treatment.

## 2015-11-29 NOTE — ED Notes (Signed)
Pt c/o diffuse abd pain with n/v/d x 3 days

## 2015-11-29 NOTE — ED Provider Notes (Signed)
CSN: [id number]     Arrival date & time 11/29/15  1317 History   First MD Initiated Contact with Patient 11/29/15 1345     Chief Complaint  Patient presents with  . Abdominal Pain     (Consider location/radiation/quality/duration/timing/severity/associated sxs/prior Treatment) HPI Patient presents to the emergency department with abdominal pain that started yesterday.  The patient states that she has also had several episodes of vomiting in the last 24 hours.  States the pain is fairly diffuse throughout her abdomen.  The patient states that she did not take any medications prior to arrival.  She states that nothing seems make the condition better, but palpation makes the pain worse. The patient denies chest pain, shortness of breath, headache,blurred vision, neck pain, fever, cough, weakness, numbness, dizziness, anorexia, edema, rash, back pain, dysuria, hematemesis, bloody stool, near syncope, or syncope. Past Medical History  Diagnosis Date  . Anxiety   . Depression   . Genital warts   . Anxiety 11/21/2011  . Anemia   . Complication of anesthesia     Swelling from some drug used for anesthesia. Does not  know name.  . Genital warts   . HPV test positive   . RhD negative   . Preterm labor   . Blood transfusion complicating pregnancy    Past Surgical History  Procedure Laterality Date  . Cesarean section    . Dilation and curettage of uterus    . Fracture surgery Right     pinky finger  . Fractured finger  2001    open reduction  . Therapeutic abortion    . Cesarean section  2007, 2009, 2010    x3  . Cesarean section N/A 07/03/2015    Procedure: CESAREAN SECTION;  Surgeon: Levie HeritageJacob J Stinson, DO;  Location: [facility];  Service: Obstetrics;  Laterality: N/A;  . Tubal ligation     Family History  Problem Relation Age of Onset  . Heart disease Father   . Hyperlipidemia Father    Social History  Substance Use Topics  . Smoking status: Current Some Day Smoker -- 0.50 packs/day  for 8 years    Types: Cigarettes    Last Attempt to Quit: 02/21/2014  . Smokeless tobacco: Never Used  . Alcohol Use: No     Comment: socially   OB History    Gravida Para Term Preterm AB TAB SAB Ectopic Multiple Living   8 4 4  0 4 1 3  0 0 4     Review of Systems All other systems negative except as documented in the HPI. All pertinent positives and negatives as reviewed in the HPI.   Allergies  Other and Latex  Home Medications   Prior to Admission medications   Medication Sig Start Date End Date Taking? Authorizing Provider  ferrous sulfate 325 (65 FE) MG tablet Take 1 tablet (325 mg total) by mouth daily. Patient not taking: Reported on 10/11/2015 07/06/15   Asiyah Mayra ReelZahra Mikell, MD  FLUoxetine (PROZAC) 20 MG capsule Take 1 capsule (20 mg total) by mouth daily. Patient not taking: Reported on 10/11/2015 07/06/15   Asiyah Mayra ReelZahra Mikell, MD  ibuprofen (ADVIL,MOTRIN) 600 MG tablet Take 1 tablet (600 mg total) by mouth every 6 (six) hours as needed. Patient not taking: Reported on 10/11/2015 07/06/15   Asiyah Mayra ReelZahra Mikell, MD  oxyCODONE-acetaminophen (PERCOCET/ROXICET) 5-325 MG tablet Take 1 tablet by mouth every 4 (four) hours as needed (for pain scale 4-7). Patient not taking: Reported on 10/11/2015 07/06/15   Asiyah  Mayra Reel, MD   BP 100/69 mmHg  Pulse 78  Temp(Src) 99 F (37.2 C)  Resp 16  Ht  (1.499 m)  Wt 61.689 kg  BMI 27.45 kg/m2  SpO2 100%  LMP 11/11/2015 Physical Exam  Constitutional: She is oriented to person, place, and time. She appears well-developed and well-nourished. No distress.  HENT:  Head: Normocephalic and atraumatic.  Mouth/Throat: Oropharynx is clear and moist.  Eyes: Pupils are equal, round, and reactive to light.  Neck: Normal range of motion. Neck supple.  Cardiovascular: Normal rate, regular rhythm and normal heart sounds.  Exam reveals no gallop and no friction rub.   No murmur heard. Pulmonary/Chest: Effort normal and breath sounds normal.  No respiratory distress. She has no wheezes.  Abdominal: Soft. Bowel sounds are normal. She exhibits no distension. There is tenderness. There is no rebound and no guarding.  Musculoskeletal: She exhibits no edema.  Neurological: She is alert and oriented to person, place, and time. She exhibits normal muscle tone. Coordination normal.  Skin: Skin is warm and dry. No rash noted. No erythema.  Psychiatric: She has a normal mood and affect. Her behavior is normal.  Nursing note and vitals reviewed.   ED Course  Procedures (including critical care time) Labs Review Labs Reviewed  URINALYSIS, ROUTINE W REFLEX MICROSCOPIC (NOT AT [facility]) - Abnormal; Notable for the following:    APPearance CLOUDY (*)    Bilirubin Urine SMALL (*)    Ketones, ur 15 (*)    All other components within normal limits  COMPREHENSIVE METABOLIC PANEL - Abnormal; Notable for the following:    Sodium 134 (*)    Potassium 3.4 (*)    Calcium 8.6 (*)    ALT 13 (*)    Anion gap 4 (*)    All other components within normal limits  CBC WITH DIFFERENTIAL/PLATELET - Abnormal; Notable for the following:    WBC 11.4 (*)    All other components within normal limits  PREGNANCY, URINE    Imaging Review No results found. I have personally reviewed and evaluated these images and lab results as part of my medical decision-making.    MDM   Final diagnoses:  None   Patient left AMA because she states she has to pick up her children from the bus stop.  The patient is explained that we need to get the CT scan in order to further delineate what is causing her pain.  The patient is advised she can return here at any time advised her that leaving could lead to worsening condition and possibly death.  The patient voices an understanding     Charlestine Night, PA-C 11/29/15 1533  Leta Baptist, MD 11/29/15 [phone]

## 2015-11-29 NOTE — ED Notes (Signed)
Pt c/o diffuse abd pain , seen here 30 mins ago for same, left to pick up kids and back now for ct

## 2015-12-01 ENCOUNTER — Encounter (HOSPITAL_BASED_OUTPATIENT_CLINIC_OR_DEPARTMENT_OTHER): Payer: Self-pay | Admitting: Emergency Medicine

## 2015-12-01 ENCOUNTER — Emergency Department (HOSPITAL_BASED_OUTPATIENT_CLINIC_OR_DEPARTMENT_OTHER): Payer: Self-pay

## 2015-12-01 ENCOUNTER — Emergency Department (HOSPITAL_COMMUNITY): Payer: Self-pay

## 2015-12-01 ENCOUNTER — Emergency Department (HOSPITAL_BASED_OUTPATIENT_CLINIC_OR_DEPARTMENT_OTHER)
Admission: EM | Admit: 2015-12-01 | Discharge: 2015-12-01 | Disposition: A | Discharge status: Home / Self Care | Payer: Self-pay | Attending: Emergency Medicine | Admitting: Emergency Medicine

## 2015-12-01 DIAGNOSIS — K529 Noninfective gastroenteritis and colitis, unspecified: Secondary | ICD-10-CM

## 2015-12-01 DIAGNOSIS — R1031 Right lower quadrant pain: Secondary | ICD-10-CM | POA: Insufficient documentation

## 2015-12-01 DIAGNOSIS — R1032 Left lower quadrant pain: Secondary | ICD-10-CM | POA: Insufficient documentation

## 2015-12-01 DIAGNOSIS — Z9851 Tubal ligation status: Secondary | ICD-10-CM | POA: Insufficient documentation

## 2015-12-01 DIAGNOSIS — F17210 Nicotine dependence, cigarettes, uncomplicated: Secondary | ICD-10-CM | POA: Insufficient documentation

## 2015-12-01 DIAGNOSIS — R109 Unspecified abdominal pain: Secondary | ICD-10-CM

## 2015-12-01 DIAGNOSIS — R1033 Periumbilical pain: Secondary | ICD-10-CM | POA: Insufficient documentation

## 2015-12-01 DIAGNOSIS — K439 Ventral hernia without obstruction or gangrene: Secondary | ICD-10-CM | POA: Insufficient documentation

## 2015-12-01 LAB — CBC WITH DIFFERENTIAL/PLATELET
Basophils Absolute: 0 10*3/uL (ref 0.0–0.1)
Basophils Relative: 0 %
EOS ABS: 0.2 10*3/uL (ref 0.0–0.7)
Eosinophils Relative: 2 %
HCT: 41.5 % (ref 36.0–46.0)
HEMOGLOBIN: 14.1 g/dL (ref 12.0–15.0)
LYMPHS ABS: 2.6 10*3/uL (ref 0.7–4.0)
Lymphocytes Relative: 30 %
MCH: 30.3 pg (ref 26.0–34.0)
MCHC: 34 g/dL (ref 30.0–36.0)
MCV: 89.2 fL (ref 78.0–100.0)
Monocytes Absolute: 0.8 10*3/uL (ref 0.1–1.0)
Monocytes Relative: 9 %
NEUTROS PCT: 59 %
Neutro Abs: 5.1 10*3/uL (ref 1.7–7.7)
PLATELETS: 337 10*3/uL (ref 150–400)
RBC: 4.65 MIL/uL (ref 3.87–5.11)
RDW: 12.9 % (ref 11.5–15.5)
WBC: 8.7 10*3/uL (ref 4.0–10.5)

## 2015-12-01 LAB — URINALYSIS, ROUTINE W REFLEX MICROSCOPIC
BILIRUBIN URINE: NEGATIVE
Glucose, UA: NEGATIVE mg/dL
Hgb urine dipstick: NEGATIVE
Ketones, ur: NEGATIVE mg/dL
Leukocytes, UA: NEGATIVE
NITRITE: NEGATIVE
Protein, ur: NEGATIVE mg/dL
SPECIFIC GRAVITY, URINE: 1.003 — AB (ref 1.005–1.030)
pH: 6.5 (ref 5.0–8.0)

## 2015-12-01 LAB — COMPREHENSIVE METABOLIC PANEL
ALT: 14 U/L (ref 14–54)
AST: 15 U/L (ref 15–41)
Albumin: 4.3 g/dL (ref 3.5–5.0)
Alkaline Phosphatase: 51 U/L (ref 38–126)
BUN: 13 mg/dL (ref 6–20)
CHLORIDE: 108 mmol/L (ref 101–111)
CO2: 23 mmol/L (ref 22–32)
CREATININE: 0.61 mg/dL (ref 0.44–1.00)
Calcium: 9.2 mg/dL (ref 8.9–10.3)
Glucose, Bld: 82 mg/dL (ref 65–99)
POTASSIUM: 4 mmol/L (ref 3.5–5.1)
SODIUM: 133 mmol/L — AB (ref 135–145)
Total Bilirubin: 0.3 mg/dL (ref 0.3–1.2)
Total Protein: 7.1 g/dL (ref 6.5–8.1)

## 2015-12-01 LAB — LIPASE, BLOOD: Lipase: 19 U/L (ref 11–51)

## 2015-12-01 LAB — PREGNANCY, URINE: PREG TEST UR: NEGATIVE

## 2015-12-01 MED ORDER — SODIUM CHLORIDE 0.9 % IV BOLUS (SEPSIS)
1000.0000 mL | Freq: Once | INTRAVENOUS | Status: AC
  Administered 2015-12-01: 1000 mL via INTRAVENOUS

## 2015-12-01 MED ORDER — DIPHENHYDRAMINE HCL 50 MG/ML IJ SOLN
25.0000 mg | Freq: Once | INTRAMUSCULAR | Status: AC
  Administered 2015-12-01: 25 mg via INTRAVENOUS
  Filled 2015-12-01: qty 1

## 2015-12-01 MED ORDER — IOPAMIDOL (ISOVUE-300) INJECTION 61%
INTRAVENOUS | Status: AC
Start: 2015-12-01 — End: 2015-12-01
  Administered 2015-12-01: 100 mL
  Filled 2015-12-01: qty 100

## 2015-12-01 MED ORDER — OXYCODONE-ACETAMINOPHEN 5-325 MG PO TABS
1.0000 | ORAL_TABLET | ORAL | Status: DC | PRN
Start: 2015-12-01 — End: 2016-10-23

## 2015-12-01 MED ORDER — DICYCLOMINE HCL 20 MG PO TABS: 20.0000 mg | ORAL_TABLET | Freq: Two times a day (BID) | ORAL | Status: DC

## 2015-12-01 MED ORDER — MORPHINE SULFATE (PF) 4 MG/ML IV SOLN
4.0000 mg | Freq: Once | INTRAVENOUS | Status: AC
  Administered 2015-12-01: 4 mg via INTRAVENOUS
  Filled 2015-12-01: qty 1

## 2015-12-01 MED ORDER — ONDANSETRON HCL 4 MG/2ML IJ SOLN
4.0000 mg | Freq: Once | INTRAMUSCULAR | Status: AC
  Administered 2015-12-01: 4 mg via INTRAVENOUS
  Filled 2015-12-01: qty 2

## 2015-12-01 MED ORDER — PROCHLORPERAZINE EDISYLATE 5 MG/ML IJ SOLN: 5.0000 mg | Freq: Once | INTRAMUSCULAR | Status: DC

## 2015-12-01 MED ORDER — CIPROFLOXACIN HCL 500 MG PO TABS: 500.0000 mg | ORAL_TABLET | Freq: Two times a day (BID) | ORAL | Status: DC

## 2015-12-01 MED ORDER — IOPAMIDOL (ISOVUE-300) INJECTION 61%: 100.0000 mL | Freq: Once | INTRAVENOUS | Status: DC | PRN

## 2015-12-01 MED ORDER — ONDANSETRON 4 MG PO TBDP: 4.0000 mg | ORAL_TABLET | Freq: Three times a day (TID) | ORAL | Status: DC | PRN

## 2015-12-01 MED ORDER — METRONIDAZOLE 500 MG PO TABS: 500.0000 mg | ORAL_TABLET | Freq: Two times a day (BID) | ORAL | Status: DC

## 2015-12-01 NOTE — ED Provider Notes (Signed)
CSN: [id number]     Arrival date & time 12/01/15  4098 History   First MD Initiated Contact with Patient 12/01/15 [phone]     Chief Complaint  Patient presents with  . Abdominal Pain     (Consider location/radiation/quality/duration/timing/severity/associated sxs/prior Treatment) HPI   [name] is a 30 y.o. female, with a history of anemia, presenting to the ED with periumbilical abdominal pain worsening over the last 5 or 6 days. Patient states that she has a known ventral hernia that occasionally causes her pain for the last 4 months. This pain began to increase on April 23 accompanied by some abdominal distention. Pain is accompanied by constant nausea, 2 episodes of vomiting, and multiple episodes of diarrhea. Patient currently rates her pain at 7 out of 10, describes it as a sensation alternating between squeezing and cramping, radiating into the lower abdomen. Patient was previously (March 2017) told to follow up with [facility] for her ventral hernia, but states that she was not able to get her Medicaid renewed until recently. Patient denies fever/chills, dizziness, or any other complaints.    Past Medical History  Diagnosis Date  . Depression   . Genital warts   . Anxiety 11/21/2011  . Anemia   . Complication of anesthesia     Swelling from some drug used for anesthesia. Does not  know name.  . Genital warts   . HPV test positive   . RhD negative   . Preterm labor   . Blood transfusion complicating pregnancy    Past Surgical History  Procedure Laterality Date  . Dilation and curettage of uterus    . Fracture surgery Right     pinky finger  . Fractured finger  2001    open reduction  . Therapeutic abortion    . Cesarean section N/A 07/03/2015    Procedure: CESAREAN SECTION;  Surgeon: Levie Heritage, DO;  Location: [facility];  Service: Obstetrics;  Laterality: N/A;  . Tubal ligation     Family History  Problem Relation Age of Onset  . Heart disease  Father   . Hyperlipidemia Father    Social History  Substance Use Topics  . Smoking status: Current Some Day Smoker -- 0.50 packs/day for 8 years    Types: Cigarettes    Last Attempt to Quit: 02/21/2014  . Smokeless tobacco: Never Used  . Alcohol Use: No     Comment: socially   OB History    Gravida Para Term Preterm AB TAB SAB Ectopic Multiple Living   0 0 0 4     Review of Systems  Constitutional: Negative for fever, chills and diaphoresis.  Respiratory: Negative for shortness of breath.   Gastrointestinal: Positive for nausea, vomiting, abdominal pain and diarrhea. Negative for blood in stool.  Genitourinary: Negative for dysuria, vaginal bleeding, vaginal discharge and pelvic pain.  All other systems reviewed and are negative.     Allergies  Other and Latex  Home Medications   Prior to Admission medications   Medication Sig Start Date End Date Taking? Authorizing Provider  ciprofloxacin (CIPRO) 500 MG tablet Take 1 tablet (500 mg total) by mouth 2 (two) times daily. 12/01/15   Massey Ruhland C Irianna Gilday, PA-C  dicyclomine (BENTYL) 20 MG tablet Take 1 tablet (20 mg total) by mouth 2 (two) times daily. 12/01/15   Marcela Alatorre C Quana Chamberlain, PA-C  metroNIDAZOLE (FLAGYL) 500 MG tablet Take 1 tablet (500 mg total) by mouth 2 (two)  times daily. 12/01/15   Arris Meyn C Atif Chapple, PA-C  ondansetron (ZOFRAN ODT) 4 MG disintegrating tablet Take 1 tablet (4 mg total) by mouth every 8 (eight) hours as needed for nausea or vomiting. 12/01/15   Riata Ikeda C Lem Peary, PA-C  oxyCODONE-acetaminophen (PERCOCET/ROXICET) 5-325 MG tablet Take 1 tablet by mouth every 4 (four) hours as needed for severe pain. 12/01/15   Hamdi Vari C Dan Dissinger, PA-C   BP 95/63 mmHg  Pulse 45  Temp(Src) 98.5 F (36.9 C) (Oral)  Resp 14  Ht 4\' 11"  (1.499 m)  Wt 61.689 kg  BMI 27.45 kg/m2  SpO2 98%  LMP 11/11/2015 Physical Exam  Constitutional: She appears well-developed and well-nourished. No distress.  HENT:  Head: Normocephalic and atraumatic.  Eyes:  Conjunctivae are normal. Pupils are equal, round, and reactive to light.  Neck: Neck supple.  Cardiovascular: Normal rate, regular rhythm, normal heart sounds and intact distal pulses.   Pulmonary/Chest: Effort normal and breath sounds normal. No respiratory distress.  Abdominal: Soft. There is no tenderness. There is no guarding.    Musculoskeletal: She exhibits no edema or tenderness.  Lymphadenopathy:    She has no cervical adenopathy.  Neurological: She is alert.  Skin: Skin is warm and dry. She is not diaphoretic.  Psychiatric: She has a normal mood and affect. Her behavior is normal.  Nursing note and vitals reviewed.   ED Course  Procedures (including critical care time) Labs Review Labs Reviewed  URINALYSIS, ROUTINE W REFLEX MICROSCOPIC (NOT AT [facility]) - Abnormal; Notable for the following:    Specific Gravity, Urine 1.003 (*)    All other components within normal limits  COMPREHENSIVE METABOLIC PANEL - Abnormal; Notable for the following:    Sodium 133 (*)    Anion gap <3 (*)    All other components within normal limits  PREGNANCY, URINE  CBC WITH DIFFERENTIAL/PLATELET  LIPASE, BLOOD    Imaging Review Ct Abdomen Pelvis W Contrast  12/01/2015  CLINICAL DATA:  Abdominal pain since Sunday with nausea and vomiting, known hernia EXAM: CT ABDOMEN AND PELVIS WITH CONTRAST TECHNIQUE: Multidetector CT imaging of the abdomen and pelvis was performed using the standard protocol following bolus administration of intravenous contrast. CONTRAST:  100 mL Isovue-300 COMPARISON:  10/11/2015 FINDINGS: Lower chest:  Visualized portions of the lung bases are clear. Hepatobiliary: Liver is normal.  Gallbladder is normal. Pancreas: Pancreas is normal. Spleen: Spleen is normal. Adrenals/Urinary Tract: Adrenal glands are normal. Kidneys are normal. No hydronephrosis. Bladder is normal. Stomach/Bowel: Mild diffuse rectal wall thickening. There is also evidence of wall thickening involving the sigmoid  colon and distal third of the descending colon. The colon is relatively decompressed which may exaggerate these findings. There is no surrounding inflammatory change. Remainder of the colon appears normal. Appendix is normal. Stomach and small bowel are normal. Vascular/Lymphatic: No significant abnormalities Reproductive: Evidence of bilateral tubal ligation. No acute abnormalities. Other: Periumbilical abdominal wall hernia measuring about 5 cm. Mesenteric fat and small bowel loops inter and exits the hernia without evidence of obstruction or strangulation. Musculoskeletal: No acute osseous abnormalities. IMPRESSION: Although the finding may be exaggerated by the decompressed state of the large bowel, there does appear to be diffuse wall thickening of the distal large bowel extending from the distal descending colon through the sigmoid colon and into the rectum. Inflammation is suspected. Possibilities include inflammatory bowel disease or infectious colitis. Electronically Signed   By: Esperanza Heiraymond  Rubner M.D.   On: 12/01/2015 16:47   I have personally reviewed and  evaluated these images and lab results as part of my medical decision-making.   EKG Interpretation None      MDM   Final diagnoses:  Abdominal pain in female  Colitis    [name] presents with abdominal pain, nausea, vomiting, and diarrhea for the last 6 days.  Findings and plan of care discussed with Blane Ohara, MD. Dr. Jodi Mourning personally evaluated and examined this patient.  Differentials for this patient's complaint include an incarcerated hernia or colitis. Arthor Captain, PA-C evaluated this patient at [facility] today. Patient had to be transferred to [facility] ED because the CT scanner at [facility] is not working. Although the patient has been partially evaluated multiple times for this abdominal pain complaint, it seems as though she has difficulty staying for the duration due to being a single mother and having children to care  for. Today's CT scan shows evidence of what is likely colitis, especially combined with the evidence of the nausea, vomiting, and diarrhea. Due to the duration of the patient's symptoms, antibiotics are warranted. Patient was encouraged to follow-up with GI should the symptoms fail to resolve. Patient further encouraged to follow-up with [facility] for evaluation of her hernia. Return precautions discussed. Patient voiced understanding of these instructions, agrees to the plan, and is comfortable with discharge.   Filed Vitals:   12/01/15 0942 12/01/15 1200 12/01/15 1537  BP: 103/60 92/57 89/52   Pulse: 91 51 58  Temp: 98.2 F (36.8 C)  98.5 F (36.9 C)  TempSrc: Oral  Oral  Resp: Height:  (1.499 m)    Weight: 61.689 kg    SpO2: 100% 100% 100%   Filed Vitals:   12/01/15 1200 12/01/15 1537 12/01/15 1700 12/01/15 1730  BP: 95/63  Pulse: 51 58 60 45  Temp:  98.5 F (36.9 C)    TempSrc:  Oral    Resp: 16 14    Height:      Weight:      SpO2: 100% 100% 100% 98%     Anselm Pancoast, PA-C 12/01/15 1818  Blane Ohara, MD 12/02/15 0106

## 2015-12-01 NOTE — ED Provider Notes (Signed)
CSN: [id number]     Arrival date & time 12/01/15  [id number] History   First MD Initiated Contact with Patient 12/01/15 [phone]     Chief Complaint  Patient presents with  . Abdominal Pain     (Consider location/radiation/quality/duration/timing/severity/associated sxs/prior Treatment) HPI   [name] This 30 year old female who presents emergency Department with chief complaint of abdominal pain. She is status post cesarean section with tubal ligation on 07/03/2015. Patient states that she was also diagnosed with a ventral hernia. Patient states that she has had some hernia pain since giving birth. Over the past 5 days she has had worsening umbilical pain and complains of pain in the bilateral lower quadrants of her abdomen. She has had watery diarrhea and nausea without vomiting. She denies mucus or blood in her stools. She has no other abdominal surgeries. She denies urinary symptoms, fever, chills, myalgias or other symptoms of systemic infections. She denies rectal pain.   Past Medical History  Diagnosis Date  . Depression   . Genital warts   . Anxiety 11/21/2011  . Anemia   . Complication of anesthesia     Swelling from some drug used for anesthesia. Does not  know name.  . Genital warts   . HPV test positive   . RhD negative   . Preterm labor   . Blood transfusion complicating pregnancy    Past Surgical History  Procedure Laterality Date  . Dilation and curettage of uterus    . Fracture surgery Right     pinky finger  . Fractured finger  2001    open reduction  . Therapeutic abortion    . Cesarean section N/A 07/03/2015    Procedure: CESAREAN SECTION;  Surgeon: Levie HeritageJacob J Stinson, DO;  Location: [facility];  Service: Obstetrics;  Laterality: N/A;  . Tubal ligation     Family History  Problem Relation Age of Onset  . Heart disease Father   . Hyperlipidemia Father    Social History  Substance Use Topics  . Smoking status: Current Some Day Smoker -- 0.50 packs/day for 8  years    Types: Cigarettes    Last Attempt to Quit: 02/21/2014  . Smokeless tobacco: Never Used  . Alcohol Use: No     Comment: socially   OB History    Gravida Para Term Preterm AB TAB SAB Ectopic Multiple Living   8 4 4  0 4 1 3  0 0 4     Review of Systems Ten systems reviewed and are negative for acute change, except as noted in the HPI.     Allergies  Other and Latex  Home Medications   Prior to Admission medications   Not on File   BP 103/60 mmHg  Pulse 91  Temp(Src) 98.2 F (36.8 C) (Oral)  Resp 18  Ht 4\' 11"  (1.499 m)  Wt 61.689 kg  BMI 27.45 kg/m2  SpO2 100%  LMP 11/11/2015 Physical Exam Physical Exam  Nursing note and vitals reviewed. Constitutional: She is oriented to person, place, and time. She appears well-developed and well-nourished. No distress.  HENT:  Head: Normocephalic and atraumatic.  Eyes: Conjunctivae normal and EOM are normal. Pupils are equal, round, and reactive to light. No scleral icterus.  Neck: Normal range of motion.  Cardiovascular: Normal rate, regular rhythm and normal heart sounds.  Exam reveals no gallop and no friction rub.   No murmur heard. Pulmonary/Chest: Effort normal and breath sounds normal. No respiratory distress.  Abdominal: Soft. Bowel sounds are  normal. Palpable ventral hernia which is exquisitely tender to palpation. She has diffuse abdominal pain and tenderness which is worse in the bilateral lower quadrants of the abdomen. The UA tenderness. Neurological: She is alert and oriented to person, place, and time.  Skin: Skin is warm and dry. She is not diaphoretic.   \ ED Course  Procedures (including critical care time) Labs Review Labs Reviewed  URINALYSIS, ROUTINE W REFLEX MICROSCOPIC (NOT AT [facility]) - Abnormal; Notable for the following:    Specific Gravity, Urine 1.003 (*)    All other components within normal limits  PREGNANCY, URINE  CBC WITH DIFFERENTIAL/PLATELET  COMPREHENSIVE METABOLIC PANEL  LIPASE,  BLOOD    Imaging Review No results found. I have personally reviewed and evaluated these images and lab results as part of my medical decision-making.   EKG Interpretation None      MDM   Final diagnoses:  Abdominal pain in female    10:22 AM BP 103/60 mmHg  Pulse 91  Temp(Src) 98.2 F (36.8 C) (Oral)  Resp 18  Ht  (1.499 m)  Wt 61.689 kg  BMI 27.45 kg/m2  SpO2 100%  LMP 11/11/2015 Patient here with abdominal pain. Bilateral lower quadrant pain. Patient feels that the right side is worse. Differential includes incarcerated hernia, colitis, appendicitis, viral gastroenteritis. Labs are pending. Patient will receive CT abdomen and pelvis with contrast. Have ordered a small dose of morphine and Zofran for pain control.  Unable to obtain [facility] at [facility] due to failure of the scanner. I discussed the case with Dr. Cyndie Chime , his accepted the patient in an ED to ED transfer. Discussed the case with the patient is willing to go for further workup. She appears safe for transfer at this time  Arthor Captain, PA-C 12/01/15 1558  Arby Barrette, MD 12/14/15 [phone]

## 2015-12-01 NOTE — ED Notes (Signed)
Pt is upset about the wait due to the CT scanner being down.  Informed provider and charge nurse.

## 2015-12-01 NOTE — ED Notes (Signed)
Pt is upset due to CT scanner being down.  PA in room speaking to patient.

## 2015-12-01 NOTE — ED Notes (Signed)
CT scanner not available at this time.  Informed provider.  Pt informed.

## 2015-12-01 NOTE — ED Notes (Signed)
Pt states she has an abdominal hernia after childbirth csection 5 months ago.  Pt has pain with hernia but worse since Sunday.  Some N/V/D.  No known fever.

## 2015-12-01 NOTE — Discharge Instructions (Signed)
You have been seen today for abdominal pain. Your CAT scan showed evidence of colitis. Please take all of your antibiotics until finished!   You may develop abdominal discomfort or diarrhea from the antibiotic.  You may help offset this with probiotics which you can buy or get in yogurt. Do not eat or take the probiotics until 2 hours after your antibiotic. Drink plenty of fluids and get plenty of rest. The Cipro and Flagyl are the 2 antibiotics to treat the colitis. The Percocet is for severe pain. Bentyl as for lesser abdominal discomfort. Zofran for nausea and vomiting. Follow-up with gastroenterology should the symptoms of pain, nausea, vomiting, and diarrhea continue. Patient to [facility] to address your hernia. Follow up with PCP as needed. Return to ED should symptoms worsen.  RESOURCE GUIDE  Chronic Pain Problems: Contact [facility]  [phone] Patients need to be referred by their primary care doctor.  Insufficient Money for Medicine: Contact United Way:  call "211" or [facility] [phone].  No Primary Care Doctor: - Call Health Connect  [phone] - can help you locate a primary care doctor that  accepts your insurance, provides certain services, etc. - Physician Referral Service- [phone]  Agencies that provide inexpensive medical care: - [facility]  [phone] - [facility]  [phone] - [facility]  [phone] - Women's Clinic  [phone] - Planned Parenthood  [phone] Haynes Bast- [facility]  [phone]  Medicaid-accepting [facility] Providers: - [facility]- [address], [address]  [phone], Mon-Fri 9am-7pm, Sat 9am-1pm - [facility]- [address], [address] [place]  [phone] - [facility]- [address], [address] [place]  [phone] [facility]- [address] [place]  [address]  [phone] - Renaye RakersVeita Bland- [address], [address], [phone]  Only accepts [place] patients after they have their name  applied to their card  Self Pay (no insurance) in [place]: - Sickle Cell Patients: Dr Willey BladeEric Dean, [facility]  [address], [phone] - [facility]- [address]  [phone]       Redge Gainer-     [facility]- [address]       -     [facility]- see information above (Speak to CitigroupPam H if you do not have insurance)       -  [facility]- [address], [phone]       -  [facility]- [address],  [phone]       -  [facility]- [address] [place] [address], [phone]       -  Dr Julio Sickssei-Bonsu-  [address], [address], [place], [phone]       -  [facility]- [address], [phone]       -  [facility] [place]- [address] [place] [address], [phone], also [address], [phone]       -    [facility]- [address], [phone], 1st & 3rd Saturday   every month, 10am-1pm  1) Find a Doctor and Pay Out of Pocket Although you won't have to find out who is covered by your insurance plan, it is a good idea to ask around and get recommendations. You  will then need to call the office and see if the doctor you have chosen will accept you as a new patient and what types of options they offer for patients who are self-pay. Some doctors offer discounts or will set up payment plans for their patients who do not have insurance, but you will need to ask so you aren't surprised when you get to your appointment.  2) Contact Your Local Health Department Not all health departments have doctors that can see patients for sick visits, but many do, so it is worth a call to see if yours does. If you don't know where your local health department is, you can check in your phone book. The CDC also has a tool to help you locate your state's  health department, and many state websites also have listings of all of their local health departments.  3) Find a Walk-in Clinic If your illness is not likely to be very severe or complicated, you may want to try a walk in clinic. These are popping up all over the country in pharmacies, drugstores, and shopping centers. They're usually staffed by nurse practitioners or physician assistants that have been trained to treat common illnesses and complaints. They're usually fairly quick and inexpensive. However, if you have serious medical issues or chronic medical problems, these are probably not your best option  STD Testing - [facility] Department of [facility] [place], STD Clinic, [address] [place], phone [phone] or [phone].  Monday - Friday, call for an appointment. [facility] of [employer], STD Clinic, [address], [place], phone [phone] or [phone].  Monday - Friday, call for an appointment.  Abuse/Neglect: [facility] Child Abuse Hotline 7608211309 [facility] Child Abuse Hotline 818-196-3522 (After Hours)  Emergency Shelter:  [facility] 4697889645  Maternity Homes: - Room at the [place] [facility] [phone] - [facility] [phone]  MRSA Hotline #:   254-180-0326  [facility] Resources  [facility]  United Way [facility] Dept. [address]                 [address]         [address] [place] [address]  Blondell Reveal Phone:  [phone]                                  Phone:  [phone]                   Phone:  [phone]  [facility], [phone] - [phone]       -     [facility] [place], [address]  [phone], [facility] Child Abuse Hotline (336)224-8581 or (938)299-0481 (After Hours)   Behavioral Health Services  Substance Abuse Resources: - Alcohol and Drug Services  [phone] - [facility] [phone] - [facility] [place] [phone] Floydene Flock [phone] - Residential & Outpatient Substance Abuse Program  [phone]  Psychological Services: [facility]  [phone] [facility] Services  [phone] - [facility], [phone] [place]. [address], [place], ACCESS LINE: [phone] or [phone], Ent[employer]ur[employer]an.co.za  Dental Assistance  If unable to pay or uninsured, contact:  [facility] or [facility]. to become qualified for the adult dental clinic.  Patients with Medicaid: [facility] [phone] [address], [phone] [address], [phone]  If unable to pay, or uninsured, contact [facility] [phone]) or [facility] Department [phone] in [place], [phone] in [facility]) to become qualified for the adult dental clinic   Other Low-Cost Community Dental Services: - [facility]- [address] [place], [place], [place], [address], [phone], Ext. [phone], 2nd and 4th Thursday of the month at 6:30am.  10 clients each day by appointment, can sometimes see walk-in patients if someone does not show for an appointment. [facility]- [address] Ether [place]ins [place], [place], [address], [phone] - [facility]- [address] [place], [place], [address], [phone] - [facility]- [phone] [facility] Department- [phone] [facility] Department- [phone]

## 2016-10-23 ENCOUNTER — Emergency Department (HOSPITAL_BASED_OUTPATIENT_CLINIC_OR_DEPARTMENT_OTHER)
Admission: EM | Admit: 2016-10-23 | Discharge: 2016-10-23 | Disposition: A | Discharge status: Home / Self Care | Payer: Self-pay | Attending: Emergency Medicine | Admitting: Emergency Medicine

## 2016-10-23 ENCOUNTER — Encounter (HOSPITAL_BASED_OUTPATIENT_CLINIC_OR_DEPARTMENT_OTHER): Payer: Self-pay

## 2016-10-23 DIAGNOSIS — R221 Localized swelling, mass and lump, neck: Secondary | ICD-10-CM | POA: Insufficient documentation

## 2016-10-23 DIAGNOSIS — K469 Unspecified abdominal hernia without obstruction or gangrene: Secondary | ICD-10-CM | POA: Insufficient documentation

## 2016-10-23 DIAGNOSIS — Z91040 Latex allergy status: Secondary | ICD-10-CM | POA: Insufficient documentation

## 2016-10-23 DIAGNOSIS — F17210 Nicotine dependence, cigarettes, uncomplicated: Secondary | ICD-10-CM | POA: Insufficient documentation

## 2016-10-23 DIAGNOSIS — K458 Other specified abdominal hernia without obstruction or gangrene: Secondary | ICD-10-CM

## 2016-10-23 DIAGNOSIS — F1290 Cannabis use, unspecified, uncomplicated: Secondary | ICD-10-CM | POA: Insufficient documentation

## 2016-10-23 LAB — URINALYSIS, ROUTINE W REFLEX MICROSCOPIC
Bilirubin Urine: NEGATIVE
GLUCOSE, UA: NEGATIVE mg/dL
Hgb urine dipstick: NEGATIVE
KETONES UR: NEGATIVE mg/dL
LEUKOCYTES UA: NEGATIVE
NITRITE: NEGATIVE
PROTEIN: NEGATIVE mg/dL
Specific Gravity, Urine: 1.004 — ABNORMAL LOW (ref 1.005–1.030)
pH: 6 (ref 5.0–8.0)

## 2016-10-23 LAB — PREGNANCY, URINE: Preg Test, Ur: NEGATIVE

## 2016-10-23 NOTE — ED Triage Notes (Signed)
c/o abd hernia x "months"-also c/o swelling to right side of neck x this am-NAD-steady gait
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
## 2016-10-30 ENCOUNTER — Telehealth: Payer: Self-pay | Admitting: General Practice

## 2017-04-19 ENCOUNTER — Encounter (HOSPITAL_BASED_OUTPATIENT_CLINIC_OR_DEPARTMENT_OTHER): Payer: Self-pay | Admitting: Emergency Medicine

## 2017-04-19 ENCOUNTER — Emergency Department (HOSPITAL_BASED_OUTPATIENT_CLINIC_OR_DEPARTMENT_OTHER)
Admission: EM | Admit: 2017-04-19 | Discharge: 2017-04-19 | Disposition: A | Discharge status: Home / Self Care | Payer: Self-pay | Attending: Emergency Medicine | Admitting: Emergency Medicine

## 2017-04-19 DIAGNOSIS — F17210 Nicotine dependence, cigarettes, uncomplicated: Secondary | ICD-10-CM | POA: Insufficient documentation

## 2017-04-19 DIAGNOSIS — R1033 Periumbilical pain: Secondary | ICD-10-CM | POA: Insufficient documentation

## 2017-04-19 LAB — PREGNANCY, URINE: Preg Test, Ur: NEGATIVE

## 2017-04-19 LAB — COMPREHENSIVE METABOLIC PANEL
ALBUMIN: 4.6 g/dL (ref 3.5–5.0)
ALK PHOS: 58 U/L (ref 38–126)
ALT: 12 U/L — ABNORMAL LOW (ref 14–54)
AST: 16 U/L (ref 15–41)
Anion gap: 7 (ref 5–15)
BILIRUBIN TOTAL: 0.4 mg/dL (ref 0.3–1.2)
BUN: 11 mg/dL (ref 6–20)
CALCIUM: 9 mg/dL (ref 8.9–10.3)
CO2: 25 mmol/L (ref 22–32)
Chloride: 106 mmol/L (ref 101–111)
Creatinine, Ser: 0.61 mg/dL (ref 0.44–1.00)
GFR calc Af Amer: >60 mL/min (ref >60)
GFR calc non Af Amer: >60 mL/min (ref >60)
GLUCOSE: 89 mg/dL (ref 65–99)
POTASSIUM: 3.5 mmol/L (ref 3.5–5.1)
SODIUM: 138 mmol/L (ref 135–145)
TOTAL PROTEIN: 8 g/dL (ref 6.5–8.1)

## 2017-04-19 LAB — CBC
HEMATOCRIT: 41.1 % (ref 36.0–46.0)
Hemoglobin: 14 g/dL (ref 12.0–15.0)
MCH: 30.4 pg (ref 26.0–34.0)
MCHC: 34.1 g/dL (ref 30.0–36.0)
MCV: 89.3 fL (ref 78.0–100.0)
Platelets: 380 10*3/uL (ref 150–400)
RBC: 4.6 MIL/uL (ref 3.87–5.11)
RDW: 14.1 % (ref 11.5–15.5)
WBC: 10.1 10*3/uL (ref 4.0–10.5)

## 2017-04-19 LAB — URINALYSIS, ROUTINE W REFLEX MICROSCOPIC
Bilirubin Urine: NEGATIVE
GLUCOSE, UA: NEGATIVE mg/dL
HGB URINE DIPSTICK: NEGATIVE
Ketones, ur: 15 mg/dL — AB
Leukocytes, UA: NEGATIVE
Nitrite: NEGATIVE
Protein, ur: NEGATIVE mg/dL
pH: 5.5 (ref 5.0–8.0)

## 2017-04-19 LAB — LIPASE, BLOOD: Lipase: 73 U/L — ABNORMAL HIGH (ref 11–51)

## 2017-04-19 NOTE — ED Notes (Signed)
Pt not found in room. Gown found on stretcher. PA made aware.
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
## 2017-04-19 NOTE — ED Notes (Signed)
PA at bedside.

## 2017-04-19 NOTE — ED Triage Notes (Signed)
Mid abd pain x 2 weeks. Hx of umbilical hernia. Pt states she thinks it has gotten bigger. Pt also reports swollen lymph nodes to several areas all over her body. Pt also endorses fatigue and nausea.

## 2017-04-22 NOTE — ED Notes (Signed)
Pt. Called and requested to have lab results and discharge papers.  Explained to pt. That she left during her visit.   Discharge papers were not available due to visit was not completed.  Also gave her information to contact Medical Records to obtain a copy of her labs.  04/22/2017, Cher Nakai, RN

## 2017-04-29 ENCOUNTER — Ambulatory Visit: Payer: Self-pay

## 2017-04-29 ENCOUNTER — Ambulatory Visit: Payer: Self-pay | Attending: Internal Medicine | Admitting: Internal Medicine

## 2017-04-29 ENCOUNTER — Encounter: Payer: Self-pay | Admitting: Internal Medicine

## 2017-04-29 ENCOUNTER — Ambulatory Visit: Payer: Self-pay | Admitting: Internal Medicine

## 2017-04-29 VITALS — BP 107/60 | HR 61 | Temp 98.2°F | Resp 16 | Wt 117.4 lb

## 2017-04-29 DIAGNOSIS — R590 Localized enlarged lymph nodes: Secondary | ICD-10-CM

## 2017-04-29 DIAGNOSIS — F1290 Cannabis use, unspecified, uncomplicated: Secondary | ICD-10-CM

## 2017-04-29 DIAGNOSIS — F17200 Nicotine dependence, unspecified, uncomplicated: Secondary | ICD-10-CM

## 2017-04-29 DIAGNOSIS — R5383 Other fatigue: Secondary | ICD-10-CM

## 2017-04-29 DIAGNOSIS — F17210 Nicotine dependence, cigarettes, uncomplicated: Secondary | ICD-10-CM | POA: Insufficient documentation

## 2017-04-29 DIAGNOSIS — F419 Anxiety disorder, unspecified: Secondary | ICD-10-CM | POA: Insufficient documentation

## 2017-04-29 DIAGNOSIS — K429 Umbilical hernia without obstruction or gangrene: Secondary | ICD-10-CM | POA: Insufficient documentation

## 2017-04-29 DIAGNOSIS — K439 Ventral hernia without obstruction or gangrene: Secondary | ICD-10-CM | POA: Insufficient documentation

## 2017-04-29 HISTORY — DX: Localized enlarged lymph nodes: R59.0

## 2017-04-29 MED ORDER — PROMETHAZINE HCL 12.5 MG RE SUPP: 12.5000 mg | Freq: Four times a day (QID) | RECTAL | 0 refills | Status: DC | PRN

## 2017-04-29 NOTE — Patient Instructions (Signed)
Drink fluids to keep self hydrated.  Try to work on quitting smoking and marijuana use. Call 1-800-Quit-Now to get free nicotine patches.

## 2017-04-29 NOTE — Progress Notes (Deleted)
Patient ID: [name], female   DOB: [birth year], 31 y.o.   MRN: [id number] After being seen in the ED 04/19/2017 for ventral hernia, R neck LN for several months and several week h/o LN in various locations, and periumbilical distention and discomfort.   Also reports nausea, light-headedness, fatigue, chills, and inability to gain weight even though she eats a lot. She denies fevers, unexpected weight loss, vomiting, diarrhea, constipation, vaginal discharge, abnormal vaginal bleeding other than menstrual cycle, dysuria
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
## 2017-04-29 NOTE — Progress Notes (Signed)
Pt is in the office today for a new patient visit Pt states she had a bad night Pt states she vomited last night for half of the night Pt states her lymph nodes are swollen and they have been for a year Pt states she is very fatigue, weak and nausea Pt states she has a hernia

## 2017-04-30 ENCOUNTER — Ambulatory Visit (HOSPITAL_COMMUNITY)
Admission: RE | Admit: 2017-04-30 | Discharge: 2017-04-30 | Disposition: A | Discharge status: Home / Self Care | Payer: Self-pay | Source: Ambulatory Visit | Attending: Internal Medicine | Admitting: Internal Medicine

## 2017-04-30 DIAGNOSIS — K439 Ventral hernia without obstruction or gangrene: Secondary | ICD-10-CM | POA: Insufficient documentation

## 2017-04-30 DIAGNOSIS — R590 Localized enlarged lymph nodes: Secondary | ICD-10-CM | POA: Insufficient documentation

## 2017-04-30 LAB — PROTEIN ELECTROPHORESIS, SERUM
A/G RATIO SPE: 1.2 (ref 0.7–1.7)
Albumin ELP: 3.7 g/dL (ref 2.9–4.4)
Alpha 1: 0.3 g/dL (ref 0.0–0.4)
Alpha 2: 0.8 g/dL (ref 0.4–1.0)
Beta: 1 g/dL (ref 0.7–1.3)
GAMMA GLOBULIN: 0.9 g/dL (ref 0.4–1.8)
GLOBULIN, TOTAL: 3 g/dL (ref 2.2–3.9)
TOTAL PROTEIN: 6.7 g/dL (ref 6.0–8.5)

## 2017-04-30 LAB — HIV ANTIBODY (ROUTINE TESTING W REFLEX): HIV SCREEN 4TH GENERATION: NONREACTIVE

## 2017-04-30 LAB — SEDIMENTATION RATE: SED RATE: 4 mm/h (ref 0–32)

## 2017-04-30 LAB — LIPASE: LIPASE: 19 U/L (ref 14–72)

## 2017-04-30 LAB — TSH: TSH: 0.893 u[IU]/mL (ref 0.450–4.500)

## 2017-04-30 LAB — ANA W/REFLEX IF POSITIVE: Anti Nuclear Antibody(ANA): NEGATIVE

## 2017-05-26 ENCOUNTER — Ambulatory Visit: Payer: Self-pay | Attending: Internal Medicine | Admitting: Internal Medicine

## 2017-05-26 ENCOUNTER — Encounter: Payer: Self-pay | Admitting: Internal Medicine

## 2017-05-26 VITALS — BP 97/63 | HR 71 | Temp 98.5°F | Resp 16 | Wt 113.4 lb

## 2017-05-26 DIAGNOSIS — Z23 Encounter for immunization: Secondary | ICD-10-CM | POA: Insufficient documentation

## 2017-05-26 DIAGNOSIS — F419 Anxiety disorder, unspecified: Secondary | ICD-10-CM | POA: Insufficient documentation

## 2017-05-26 DIAGNOSIS — Z800 Family history of malignant neoplasm of digestive organs: Secondary | ICD-10-CM | POA: Insufficient documentation

## 2017-05-26 DIAGNOSIS — F32A Depression, unspecified: Secondary | ICD-10-CM

## 2017-05-26 DIAGNOSIS — Z91040 Latex allergy status: Secondary | ICD-10-CM | POA: Insufficient documentation

## 2017-05-26 DIAGNOSIS — Z841 Family history of disorders of kidney and ureter: Secondary | ICD-10-CM | POA: Insufficient documentation

## 2017-05-26 DIAGNOSIS — L03222 Acute lymphangitis of neck: Secondary | ICD-10-CM

## 2017-05-26 DIAGNOSIS — Z8249 Family history of ischemic heart disease and other diseases of the circulatory system: Secondary | ICD-10-CM | POA: Insufficient documentation

## 2017-05-26 DIAGNOSIS — F17210 Nicotine dependence, cigarettes, uncomplicated: Secondary | ICD-10-CM | POA: Insufficient documentation

## 2017-05-26 DIAGNOSIS — Z811 Family history of alcohol abuse and dependence: Secondary | ICD-10-CM | POA: Insufficient documentation

## 2017-05-26 DIAGNOSIS — R221 Localized swelling, mass and lump, neck: Secondary | ICD-10-CM

## 2017-05-26 DIAGNOSIS — F329 Major depressive disorder, single episode, unspecified: Secondary | ICD-10-CM | POA: Insufficient documentation

## 2017-05-26 DIAGNOSIS — R590 Localized enlarged lymph nodes: Secondary | ICD-10-CM | POA: Insufficient documentation

## 2017-05-26 DIAGNOSIS — A630 Anogenital (venereal) warts: Secondary | ICD-10-CM | POA: Insufficient documentation

## 2017-05-26 DIAGNOSIS — K439 Ventral hernia without obstruction or gangrene: Secondary | ICD-10-CM | POA: Insufficient documentation

## 2017-05-26 MED ORDER — SERTRALINE HCL 50 MG PO TABS: ORAL_TABLET | ORAL | 3 refills | Status: DC

## 2017-05-26 MED FILL — SERTRALINE HCL 50 MG TABLET: 50 | 30 days supply | Qty: 30 | Fill #0

## 2017-05-26 NOTE — Patient Instructions (Addendum)
Give appointment with jasmine in 6 wks  Influenza Virus Vaccine injection (Fluarix) What is this medicine? INFLUENZA VIRUS VACCINE (in floo EN zuh VAHY ruhs vak SEEN) helps to reduce the risk of getting influenza also known as the flu. This medicine may be used for other purposes; ask your health care provider or pharmacist if you have questions. COMMON BRAND NAME(S): Fluarix, Fluzone What should I tell my health care provider before I take this medicine? They need to know if you have any of these conditions: -bleeding disorder like hemophilia -fever or infection -Guillain-Barre syndrome or other neurological problems -immune system problems -infection with the human immunodeficiency virus (HIV) or AIDS -low blood platelet counts -multiple sclerosis -an unusual or allergic reaction to influenza virus vaccine, eggs, chicken proteins, latex, gentamicin, other medicines, foods, dyes or preservatives -pregnant or trying to get pregnant -breast-feeding How should I use this medicine? This vaccine is for injection into a muscle. It is given by a health care professional. A copy of Vaccine Information Statements will be given before each vaccination. Read this sheet carefully each time. The sheet may change frequently. Talk to your pediatrician regarding the use of this medicine in children. Special care may be needed. Overdosage: If you think you have taken too much of this medicine contact a poison control center or emergency room at once. NOTE: This medicine is only for you. Do not share this medicine with others. What if I miss a dose? This does not apply. What may interact with this medicine? -chemotherapy or radiation therapy -medicines that lower your immune system like etanercept, anakinra, infliximab, and adalimumab -medicines that treat or prevent blood clots like warfarin -phenytoin -steroid medicines like prednisone or cortisone -theophylline -vaccines This list may not  describe all possible interactions. Give your health care provider a list of all the medicines, herbs, non-prescription drugs, or dietary supplements you use. Also tell them if you smoke, drink alcohol, or use illegal drugs. Some items may interact with your medicine. What should I watch for while using this medicine? Report any side effects that do not go away within 3 days to your doctor or health care professional. Call your health care provider if any unusual symptoms occur within 6 weeks of receiving this vaccine. You may still catch the flu, but the illness is not usually as bad. You cannot get the flu from the vaccine. The vaccine will not protect against colds or other illnesses that may cause fever. The vaccine is needed every year. What side effects may I notice from receiving this medicine? Side effects that you should report to your doctor or health care professional as soon as possible: -allergic reactions like skin rash, itching or hives, swelling of the face, lips, or tongue Side effects that usually do not require medical attention (report to your doctor or health care professional if they continue or are bothersome): -fever -headache -muscle aches and pains -pain, tenderness, redness, or swelling at site where injected -weak or tired This list may not describe all possible side effects. Call your doctor for medical advice about side effects. You may report side effects to FDA at 1-800-FDA-1088. Where should I keep my medicine? This vaccine is only given in a clinic, pharmacy, doctor's office, or other health care setting and will not be stored at home. NOTE: This sheet is a summary. It may not cover all possible information. If you have questions about this medicine, talk to your doctor, pharmacist, or health care provider.  2018 Elsevier/Gold  Standard (2008-02-17 09:30:40)
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
## 2017-05-29 ENCOUNTER — Ambulatory Visit (HOSPITAL_COMMUNITY)
Admission: RE | Admit: 2017-05-29 | Discharge: 2017-05-29 | Disposition: A | Discharge status: Home / Self Care | Payer: Self-pay | Source: Ambulatory Visit | Attending: Internal Medicine | Admitting: Internal Medicine

## 2017-05-29 DIAGNOSIS — R221 Localized swelling, mass and lump, neck: Secondary | ICD-10-CM | POA: Insufficient documentation

## 2017-05-29 DIAGNOSIS — L03222 Acute lymphangitis of neck: Secondary | ICD-10-CM | POA: Insufficient documentation

## 2017-05-29 MED ORDER — IOPAMIDOL (ISOVUE-300) INJECTION 61%
INTRAVENOUS | Status: AC
  Administered 2017-05-29: 15:00:00
  Filled 2017-05-29: qty 75

## 2017-06-05 ENCOUNTER — Ambulatory Visit: Payer: Self-pay | Attending: Internal Medicine

## 2017-07-07 ENCOUNTER — Encounter: Payer: Self-pay | Admitting: Internal Medicine

## 2017-07-07 ENCOUNTER — Ambulatory Visit: Payer: Self-pay | Attending: Internal Medicine | Admitting: Licensed Clinical Social Worker

## 2017-07-07 ENCOUNTER — Ambulatory Visit: Payer: Self-pay | Attending: Internal Medicine | Admitting: Internal Medicine

## 2017-07-07 VITALS — BP 91/59 | HR 77 | Temp 98.7°F | Resp 18 | Ht 59.0 in | Wt 115.0 lb

## 2017-07-07 DIAGNOSIS — A630 Anogenital (venereal) warts: Secondary | ICD-10-CM | POA: Insufficient documentation

## 2017-07-07 DIAGNOSIS — F17210 Nicotine dependence, cigarettes, uncomplicated: Secondary | ICD-10-CM | POA: Insufficient documentation

## 2017-07-07 DIAGNOSIS — Z79899 Other long term (current) drug therapy: Secondary | ICD-10-CM | POA: Insufficient documentation

## 2017-07-07 DIAGNOSIS — F419 Anxiety disorder, unspecified: Secondary | ICD-10-CM | POA: Insufficient documentation

## 2017-07-07 DIAGNOSIS — F32A Depression, unspecified: Secondary | ICD-10-CM

## 2017-07-07 DIAGNOSIS — F329 Major depressive disorder, single episode, unspecified: Secondary | ICD-10-CM | POA: Insufficient documentation

## 2017-07-07 MED ORDER — SERTRALINE HCL 100 MG PO TABS: ORAL_TABLET | ORAL | 2 refills | Status: DC

## 2017-07-07 MED ORDER — SERTRALINE HCL 100 MG PO TABS: 100.0000 mg | ORAL_TABLET | Freq: Every day | ORAL | 2 refills | Status: DC

## 2017-07-07 MED ORDER — HYDROXYZINE HCL 25 MG PO TABS: 25.0000 mg | ORAL_TABLET | Freq: Two times a day (BID) | ORAL | 1 refills | Status: DC | PRN

## 2017-07-07 MED FILL — SERTRALINE HCL 100 MG TAB: 100 | 30 days supply | Qty: 30 | Fill #0

## 2017-07-07 MED FILL — hydrOXYzine HCL 25 MG TABS: 25 | 15 days supply | Qty: 30 | Fill #0

## 2017-07-07 NOTE — Patient Instructions (Addendum)
Please schedule appointment for pt to see Jasmine in 1 month for follow up on depression/anxiety.   [facility].  Increase Zoloft to 100 mg daily.  Take the Hydroxyzine twice a day as needed for anxiety.
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
## 2017-08-22 ENCOUNTER — Encounter: Payer: Self-pay | Admitting: Internal Medicine

## 2017-08-22 ENCOUNTER — Ambulatory Visit: Payer: Medicaid Other | Attending: Internal Medicine | Admitting: Internal Medicine

## 2017-08-22 ENCOUNTER — Ambulatory Visit: Payer: Medicaid Other | Admitting: Licensed Clinical Social Worker

## 2017-08-22 VITALS — BP 93/55 | HR 82 | Temp 98.1°F | Resp 16 | Wt 113.0 lb

## 2017-08-22 DIAGNOSIS — F329 Major depressive disorder, single episode, unspecified: Secondary | ICD-10-CM | POA: Diagnosis not present

## 2017-08-22 DIAGNOSIS — A630 Anogenital (venereal) warts: Secondary | ICD-10-CM | POA: Insufficient documentation

## 2017-08-22 DIAGNOSIS — F17210 Nicotine dependence, cigarettes, uncomplicated: Secondary | ICD-10-CM | POA: Diagnosis not present

## 2017-08-22 DIAGNOSIS — F419 Anxiety disorder, unspecified: Secondary | ICD-10-CM | POA: Diagnosis not present

## 2017-08-22 DIAGNOSIS — Z113 Encounter for screening for infections with a predominantly sexual mode of transmission: Secondary | ICD-10-CM | POA: Diagnosis not present

## 2017-08-22 DIAGNOSIS — Z79899 Other long term (current) drug therapy: Secondary | ICD-10-CM | POA: Diagnosis not present

## 2017-08-22 DIAGNOSIS — K439 Ventral hernia without obstruction or gangrene: Secondary | ICD-10-CM

## 2017-08-22 DIAGNOSIS — N765 Ulceration of vagina: Secondary | ICD-10-CM

## 2017-08-22 DIAGNOSIS — Z9189 Other specified personal risk factors, not elsewhere classified: Secondary | ICD-10-CM

## 2017-08-22 DIAGNOSIS — N898 Other specified noninflammatory disorders of vagina: Secondary | ICD-10-CM

## 2017-08-22 MED ORDER — BUSPIRONE HCL 5 MG PO TABS: 5.0000 mg | ORAL_TABLET | Freq: Two times a day (BID) | ORAL | 6 refills | Status: DC

## 2017-08-22 MED FILL — busPIRone HCL 10 MG TABS: 10 | 30 days supply | Qty: 30 | Fill #0

## 2017-08-22 NOTE — Progress Notes (Signed)
Pt states she thinks her bf is cheating on her and has been for a sometime now  Pt states she went through her bf phone and she that he was looking up symptoms of Chlamydia  Pt states she feels irration   Pt states she thinks she has bad circulation in her hands to where they turn purple and they fall asleep  Pt states the anxiety medication does not work  Pt states she tries to swallow how she has been feeling. Pt states she has been feeling angry and sad. Pt states she has been struggling  Pt states she has not been sleeping  Pt states she has not been eating. Pt states she just been drinking coffee. Pt states she has been drinking ensure to help.   Pt states she does smoke weed. Pt states it only calms her for an hour or 2 but then she wants to smoke again. Pt states she is smoking a lot of cigarettes.   Pt states her mother is in immigration prision so she is dealing with that.   Pt would like to speak with social worker today.  Pt states she has some pain when she pees

## 2017-08-22 NOTE — Patient Instructions (Signed)
Please establish care with [facility] for [facility] services.   Continue Zoloft. Add Buspar 5 mg twice a day.  Stop Hydroxyzine.

## 2017-08-22 NOTE — BH Specialist Note (Signed)
Integrated Behavioral Health Initial Visit  MRN: [id number] Name: [name]  Number of Integrated Behavioral Health Clinician visits:: 1/6 Session Start time: 11:45 AM  Session End time: 12:20 PM Total time: 35 minutes  Type of Service: Integrated Behavioral Health- Individual/Family Interpretor:No. Interpretor Name and Language: N/A   Warm Hand Off Completed.       SUBJECTIVE: [name] is a 32 y.o. female accompanied by minor child Patient was referred by Dr. Laural BenesJohnson for depression and anxiety. Patient reports the following symptoms/concerns: overwhelming feelings of sadness and worry, difficulty sleeping, low energy/motivation, decreased concentration, irritability, and substance use Duration of problem: Ongoing, Pt has been diagnosed with depression as a minor; Severity of problem: severe  OBJECTIVE: Mood: Anxious and Depressed and Affect: Tearful Risk of harm to self or others: No plan to harm self or others  LIFE CONTEXT: Family and Social: Pt resides with her four minor children and boyfriend. She has limited support as mother is currently detained by [place] School/Work: Pt is employed Self-Care: Pt participates in medication management. She smokes marijuana daily to assist in coping with stressors and decrease symptoms Life Changes: Pt is experiencing increased stress from partner and inability to assist mother financially while incarcerated.   GOALS ADDRESSED: Patient will: 1. Reduce symptoms of: agitation, anxiety and depression 2. Increase knowledge and/or ability of: coping skills and healthy habits  3. Demonstrate ability to: Increase adequate support systems for patient/family, Improve medication compliance and Decrease self-medicating behaviors  INTERVENTIONS: Interventions utilized: Mindfulness or Management consultantelaxation Training, Supportive Counseling, Psychoeducation and/or Health Education and Link to [facility]  Standardized Assessments  completed: GAD-7 and PHQ 2&9  ASSESSMENT: Patient currently experiencing depression and anxiety triggered by psychosocial stressors. She reports overwhelming feelings of sadness and worry, difficulty sleeping, low energy/motivation, decreased concentration, irritability, and substance use. Pt receives limited support.   Patient may benefit from psychoeducation and psychotherapy. She participates in medication management through PCP. LCSWA educated pt on correlation between one's physical and mental health, in addition, to how stress can negatively impact one's health. LCSWA discussed therapeutic interventions to assist in relaxation/mindfullness and decrease agitation. Pt is open to referral to [facility] for psychotherapy and peer support for co-occurring disorders. She agreed to continue medication management through PCP. LCSWA provided crisis intervention resources.   PLAN: 1. Follow up with behavioral health clinician on : Pt was encouraged to contact LCSWA if symptoms worsen or fail to improve to schedule behavioral appointments at [facility]. 2. Behavioral recommendations: LCSWA recommends that pt apply healthy coping skills discussed, comply with medication management, and utilize provided resources.  3. Referral(s): Integrated [facility]al Health Services (In Clinic) and Counselor [facility] 4. "From scale of 1-10, how likely are you to follow plan?": 8/10  Bridgett LarssonJasmine D Gregoire Bennis, LCSW 08/26/16 5:45 PM
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
## 2017-08-25 ENCOUNTER — Ambulatory Visit: Payer: Self-pay | Admitting: Internal Medicine

## 2017-08-25 LAB — URINE CYTOLOGY ANCILLARY ONLY
Chlamydia: NEGATIVE
NEISSERIA GONORRHEA: NEGATIVE
TRICH (WINDOWPATH): NEGATIVE

## 2017-08-25 LAB — HERPES SIMPLEX VIRUS CULTURE

## 2017-08-26 ENCOUNTER — Telehealth: Payer: Self-pay | Admitting: Internal Medicine

## 2017-08-26 NOTE — Telephone Encounter (Signed)
Pt was approved 100% [place] discount effective 07/05/17 to 01/03/18.  Patient asked me to let you know because she is supposed to be referred for surgery.

## 2017-09-01 ENCOUNTER — Ambulatory Visit: Payer: Self-pay | Admitting: General Surgery

## 2017-09-02 ENCOUNTER — Telehealth: Payer: Self-pay | Admitting: Licensed Clinical Social Worker

## 2017-09-02 NOTE — Telephone Encounter (Signed)
LCSWA contacted [facility] to confirm receipt of psychotherapy referral.   The referral was accepted and pt has been contacted by staff.

## 2017-09-04 ENCOUNTER — Telehealth: Payer: Self-pay | Admitting: Licensed Clinical Social Worker

## 2017-09-04 ENCOUNTER — Ambulatory Visit: Payer: Self-pay | Admitting: General Surgery

## 2017-09-04 NOTE — Telephone Encounter (Signed)
LCSWA attempted to contact pt to follow up on behavioral health. A message was left requesting a return call.

## 2017-09-08 ENCOUNTER — Ambulatory Visit: Payer: Self-pay | Admitting: Surgery

## 2017-09-10 ENCOUNTER — Ambulatory Visit: Payer: Self-pay | Admitting: Surgery

## 2017-09-10 ENCOUNTER — Telehealth: Payer: Self-pay | Admitting: Internal Medicine

## 2017-09-10 ENCOUNTER — Telehealth: Payer: Self-pay | Admitting: General Practice

## 2017-09-10 NOTE — Telephone Encounter (Signed)
error 

## 2017-09-10 NOTE — Telephone Encounter (Signed)
Pt called very upset since she was schedule an appt for surgery in [place], she said, she doesn't know why they sent her there since she live in [place], she said that she is on the way to the office to speak with the nurse since her PCP is not here, then she hang up, I investigate with the referral coordinator why she was sent to [place], she said that is the only place that take the [facility] financial program and since she does not have insurance that is the only place she can go unless she is willing to pay for a the appt.Marland Kitchen.Marland Kitchen..Marland Kitchen

## 2017-09-15 ENCOUNTER — Telehealth: Payer: Self-pay | Admitting: Surgery

## 2017-09-15 NOTE — Telephone Encounter (Signed)
Left message for patient to return call in reference  To her request to speak with the Practice Manager.

## 2017-09-16 ENCOUNTER — Other Ambulatory Visit: Payer: Self-pay

## 2017-09-18 ENCOUNTER — Ambulatory Visit: Payer: Self-pay | Admitting: Surgery

## 2017-09-18 ENCOUNTER — Other Ambulatory Visit: Payer: Self-pay

## 2017-09-18 ENCOUNTER — Encounter (HOSPITAL_BASED_OUTPATIENT_CLINIC_OR_DEPARTMENT_OTHER): Payer: Self-pay

## 2017-09-18 ENCOUNTER — Emergency Department (HOSPITAL_BASED_OUTPATIENT_CLINIC_OR_DEPARTMENT_OTHER)
Admission: EM | Admit: 2017-09-18 | Discharge: 2017-09-18 | Disposition: A | Discharge status: Home / Self Care | Payer: Medicaid Other | Attending: Emergency Medicine | Admitting: Emergency Medicine

## 2017-09-18 DIAGNOSIS — Z91040 Latex allergy status: Secondary | ICD-10-CM | POA: Insufficient documentation

## 2017-09-18 DIAGNOSIS — F17210 Nicotine dependence, cigarettes, uncomplicated: Secondary | ICD-10-CM | POA: Diagnosis not present

## 2017-09-18 DIAGNOSIS — R05 Cough: Secondary | ICD-10-CM | POA: Diagnosis present

## 2017-09-18 DIAGNOSIS — J069 Acute upper respiratory infection, unspecified: Secondary | ICD-10-CM | POA: Diagnosis not present

## 2017-09-18 LAB — RAPID STREP SCREEN (MED CTR MEBANE ONLY): Streptococcus, Group A Screen (Direct): NEGATIVE

## 2017-09-18 MED ORDER — FLUTICASONE PROPIONATE 50 MCG/ACT NA SUSP: 2.0000 | Freq: Every day | NASAL | 2 refills | Status: DC

## 2017-09-18 MED ORDER — IBUPROFEN 800 MG PO TABS
800.0000 mg | ORAL_TABLET | Freq: Once | ORAL | Status: AC
  Administered 2017-09-18: 800 mg via ORAL
  Filled 2017-09-18: qty 1
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
## 2017-09-18 NOTE — Discharge Instructions (Signed)
[name],  Please take tylenol and ibuprofen as needed for muscle aches and fever. You may have flu--cover mouth with coughing and stay out of work until no longer having fever. Try flonase for nasal congestion.

## 2017-09-18 NOTE — ED Triage Notes (Signed)
C/o flu like sx x 1 month-worse x 2 days-NAD-steady gait

## 2017-09-21 LAB — CULTURE, GROUP A STREP (THRC)

## 2017-09-23 ENCOUNTER — Encounter: Payer: Self-pay | Admitting: General Practice

## 2017-10-09 ENCOUNTER — Ambulatory Visit (INDEPENDENT_AMBULATORY_CARE_PROVIDER_SITE_OTHER): Payer: Self-pay | Admitting: Surgery

## 2017-10-09 ENCOUNTER — Encounter: Payer: Self-pay | Admitting: Surgery

## 2017-10-09 VITALS — BP 100/66 | HR 82 | Temp 98.0°F | Ht 59.0 in | Wt 117.0 lb

## 2017-10-09 DIAGNOSIS — K439 Ventral hernia without obstruction or gangrene: Secondary | ICD-10-CM

## 2017-10-09 NOTE — Patient Instructions (Signed)
You have requested to have a Ventral Hernia Repair. This will be done on 11/04/2017 by Dr[facility]. Please see your (BLUE) Pre-care sheet for more information.  You will need to arrange to be out of work for approximately 1-2 weeks and then you may return with a lifting restriction for 4 more weeks. If you have FMLA or Disability paperwork that needs to be filled out, please have your company fax your paperwork to [phone] or you may drop this by either office. This paperwork will be filled out within 3 days after your surgery has been completed.    Ventral Hernia A ventral hernia (also called an incisional hernia) is a hernia that occurs at the site of a previous surgical cut (incision) in the abdomen. The abdominal wall spans from your lower chest down to your pelvis. If the abdominal wall is weakened from a surgical incision, a hernia can occur. A hernia is a bulge of bowel or muscle tissue pushing out on the weakened part of the abdominal wall. Ventral hernias can get bigger from straining or lifting. Obese and older people are at higher risk for a ventral hernia. People who develop infections after surgery or require repeat incisions at the same site on the abdomen are also at increased risk. CAUSES  A ventral hernia occurs because of weakness in the abdominal wall at an incision site.  SYMPTOMS  Common symptoms include:  A visible bulge or lump on the abdominal wall.  Pain or tenderness around the lump.  Increased discomfort if you cough or make a sudden movement. If the hernia has blocked part of the intestine, a serious complication can occur (incarcerated or strangulated hernia). This can become a problem that requires emergency surgery because the blood flow to the blocked intestine may be cut off. Symptoms may include:  Feeling sick to your stomach (nauseous).  Throwing up (vomiting).  Stomach swelling (distention) or bloating.  Fever.  Rapid heartbeat. DIAGNOSIS   Your health care provider will take a medical history and perform a physical exam. Various tests may be ordered, such as:  Blood tests.  Urine tests.  Ultrasonography.  X-rays.  Computed tomography (CT). TREATMENT  Watchful waiting may be all that is needed for a smaller hernia that does not cause symptoms. Your health care provider may recommend the use of a supportive belt (truss) that helps to keep the abdominal wall intact. For larger hernias or those that cause pain, surgery to repair the hernia is usually recommended. If a hernia becomes strangulated, emergency surgery needs to be done right away. HOME CARE INSTRUCTIONS  Avoid putting pressure or strain on the abdominal area.  Avoid heavy lifting.  Use good body positioning for physical tasks. Ask your health care provider about proper body positioning.  Use a supportive belt as directed by your health care provider.  Maintain a healthy weight.  Eat foods that are high in fiber, such as whole grains, fruits, and vegetables. Fiber helps prevent difficult bowel movements (constipation).  Drink enough fluids to keep your urine clear or pale yellow.  Follow up with your health care provider as directed. SEEK MEDICAL CARE IF:   Your hernia seems to be getting larger or more painful. SEEK IMMEDIATE MEDICAL CARE IF:   You have abdominal pain that is sudden and sharp.  Your pain becomes severe.  You have repeated vomiting.  You are sweating a lot.  You notice a rapid heartbeat.  You develop a fever. MAKE SURE YOU:  Understand these instructions.  Will watch your condition.  Will get help right away if you are not doing well or get worse.     Open Ventral Hernia Repair Open ventral hernia repairis a surgery to fix a ventral hernia. Aventral hernia,  is a bulge of body tissue or intestines that pushes through the front part of the abdomen. This can happen if the connective tissue covering the muscles over the  abdomen has a weak spot or is torn because of a surgical cut (incision) from a previous surgery. A ventral hernia repair is often done soon after diagnosis to stop the hernia from getting bigger, becoming uncomfortable, or becoming an emergency. This surgery usually takes about 2 hours, but the time can vary greatly.  LET [facility] CARE PROVIDER KNOW ABOUT:  Any allergies you have.  All medicines you are taking, including steroids, vitamins, herbs, eye drops, creams, and over-the-counter medicines.  Previous problems you or members of your family have had with the use of anesthetics.  Any blood disorders you have.  Previous surgeries you have had.  Medical conditions you have.  RISKS AND COMPLICATIONS  Generally, Open ventral hernia repair is a safe procedure. However, as with any surgical procedure, problems can occur. Possible problems include:  Bleeding.  Trouble passing urine or having a bowel movement after the surgery.  Infection.  Pneumonia.  Blood clots.  Pain in the area of the hernia.  A bulge in the area of the hernia that may be caused by a collection of fluid.  Injury to intestines or other structures in the abdomen.  Return of the hernia after surgery.  BEFORE THE PROCEDURE   You may need to have blood tests, urine tests, a chest X-ray, or an electrocardiogram done before the day of the surgery.  Ask your health care provider about changing or stopping your regular medicines. This is especially important if you are taking diabetes medicines or blood thinners.  You may need to wash with a special type of germ-killing soap.  Do not eat or drink anything after midnight the night before the procedure or as directed by your health care provider.  Make plans to have someone drive you home after the procedure.  PROCEDURE   Small monitors will be put on your body. They are used to check your heart, blood pressure, and oxygen level.  An IV access tube will  be put into a vein in your hand or arm. Fluids and medicine will flow directly into your body through the IV tube.  You will be given medicine that makes you go to sleep (general anesthetic).  Your abdomen will be cleaned with a special soap to kill any germs on your skin.  Once you are asleep, a moderate - large size incision will be made in your abdomen. The size of incision depends on how large your hernia is.  Your surgeon puts the tissue or intestines that formed the hernia back in place.  A screen-like patch (mesh) is used to close the hernia. This helps make the area stronger. Stitches, tacks, or staples are used to keep the mesh in place.  Medicine and a bandage (dressing) or skin glue will be put over the incision.  AFTER THE PROCEDURE   You will stay in a recovery area until the anesthetic wears off. Your blood pressure and pulse will be checked often.  You may be able to go home the same day or may need to stay in the hospital  for 1-2 days after surgery. Your surgeon will decide when you can go home depending upon your recovery.  You may feel some pain. You will be given medicine for pain.  You will be urged to do breathing exercises that involve taking deep breaths. This helps prevent a lung infection after a surgery.  You may have to wear compression stockings while you are in the hospital. These stockings help keep blood clots from forming in your legs.   This information is not intended to replace advice given to you by your health care provider. Make sure you discuss any questions you have with your health care provider.   Document Released: 07/08/2012 Document Revised: 07/27/2013 Document Reviewed: 07/08/2012 Elsevier Interactive Patient Education [employer].

## 2017-10-10 ENCOUNTER — Encounter: Payer: Self-pay | Admitting: Surgery

## 2017-10-10 ENCOUNTER — Encounter: Payer: Self-pay | Admitting: *Deleted

## 2017-10-10 NOTE — Progress Notes (Signed)
10/10/2017  Reason for Visit:  Ventral incisional hernia  Referring Provider:  Jonah Blueeborah Johnson, MD  History of Present Illness: [name] is a 32 y.o. female who presents for evaluation of a ventral hernia.  She has actually been referred before and she reports that she has missed all her previous appointments due to her previous job and needing to get childcare for her children and she did not have insurance.  Now she has insurance and does not have a job so she was able to come to the appointment.  She reports that she's had multiple c-sections and her last one was about 2 years ago.  She has had a hernia at her c-section site since then.  The hernia has become more symptomatic with time.  The main symptom she has is lower abdominal discomfort from the hernia bulging.  She also feels that as it bulges more, there is tugging from the upper abdomen as well.  She denies any obstructive symptoms and particularly denies any significant constipation, nausea, or vomiting.  She is able to eat well, though recently she's had some cold symptoms and her appetite has not been as good.  She reports that when it first started, the hernia was self reducible when she would lie flat in bed.  Now it stays bulging.  She has not tried to push it back in.    Past Medical History: Past Medical History:  Diagnosis Date  . Anemia   . Anxiety 11/21/2011  . Blood transfusion complicating pregnancy   . Cervical lymphadenopathy 04/29/2017  . Complication of anesthesia    Swelling from some drug used for anesthesia. Does not  know name.  . Depression   . Genital warts   . Genital warts   . HPV test positive   . Immunization due 05/26/2017  . Preterm labor   . RhD negative   . Tobacco dependence 04/29/2017  . TOBACCO USER 04/22/2009   Qualifier: Diagnosis of  By: Knox Royaltydell, Erin    . Ventral hernia without obstruction or gangrene 04/29/2017     Past Surgical History: Past Surgical History:  Procedure  Laterality Date  . CESAREAN SECTION N/A 07/03/2015   Procedure: CESAREAN SECTION;  Surgeon: Levie HeritageJacob J Stinson, DO;  Location: [facility] OR[facility];  Service: Obstetrics;  Laterality: N/A;  . DILATION AND CURETTAGE OF UTERUS    . FRACTURE SURGERY Right    pinky finger  . fractured finger  2001   open reduction  . THERAPEUTIC ABORTION    . TUBAL LIGATION      Home Medications: Prior to Admission medications   None    Allergies: Allergies  Allergen Reactions  . Other Anaphylaxis    Childhood reaction, finger surgery, total body swelling to anesthesia  medication  . Latex Itching and Swelling    Social History:  reports that she has been smoking cigarettes.  She has a 2.00 pack-year smoking history. she has never used smokeless tobacco. She reports that she uses drugs. Drug: Marijuana. She reports that she does not drink alcohol.   Family History: Family History  Problem Relation Age of Onset  . Colon cancer Mother   . Heart disease Father   . Hyperlipidemia Father   . Kidney disease Father   . Alcohol abuse Father     Review of Systems: Review of Systems  Constitutional: Negative for chills and fever.  HENT: Negative for hearing loss.   Respiratory: Negative for shortness of breath.   Cardiovascular: Negative for chest  pain.  Gastrointestinal: Positive for abdominal pain. Negative for constipation, diarrhea, nausea and vomiting.  Genitourinary: Negative for dysuria.  Musculoskeletal: Negative for myalgias.  Skin: Negative for rash.  Neurological: Negative for dizziness.  Psychiatric/Behavioral: Negative for depression.  All other systems reviewed and are negative.   Physical Exam BP 100/66   Pulse 82   Temp 98 F (36.7 C) (Oral)   Ht 4\' 11"  (1.499 m)   Wt 53.1 kg (117 lb)   LMP 09/11/2017   BMI 23.63 kg/m  CONSTITUTIONAL: No acute distress HEENT:  Normocephalic, atraumatic, extraocular motion intact. NECK: Trachea is midline, and there is no jugular venous distension.   RESPIRATORY:  Lungs are clear, and breath sounds are equal bilaterally. Normal respiratory effort without pathologic use of accessory muscles. CARDIOVASCULAR: Heart is regular without murmurs, gallops, or rubs. GI: The abdomen is soft, non-distended, with mild discomfort to palpation over the low abdomen at the hernia.  The patient has a partially reducible large ventral hernia just superior to her prior c-section scar. The hernia sac contains small bowel. MUSCULOSKELETAL:  Normal muscle strength and tone in all four extremities.  No peripheral edema or cyanosis. SKIN: Skin turgor is normal. There are no pathologic skin lesions.  NEUROLOGIC:  Motor and sensation is grossly normal.  Cranial nerves are grossly intact. PSYCH:  Alert and oriented to person, place and time. Affect is normal.  Laboratory Analysis: None recently  Imaging: None recently  Assessment and Plan: This is a 32 y.o. female who presents with an incisional ventral hernia.  Discussed with the patient the options for laparoscopic vs open ventral hernia repair with mesh.  She has opted for an open repair.  She would rather not have more new scars from port sites if there is any chance that the procedure would have to be converted to open.  Instead she would rather just use the same c-section scar.  Discussed the risks of bleeding, infection, injury to surrounding structures, particularly bowel that's in the hernia sac, risk of not being able to use mesh to reinforce the repair.  She's willing to proceed.  Discussed that this could be a possible outpatient procedure or an overnight stay for pain control.  She understands this plan and all of her questions have been answered.  She will be scheduled for 11/03/17.  Face-to-face time spent with the patient and care providers was 60 minutes, with more than 50% of the time spent counseling, educating, and coordinating care of the patient.     Howie Ill, MD [facility]

## 2017-10-10 NOTE — H&P (View-Only) (Signed)
10/10/2017  Reason for Visit:  Ventral incisional hernia  Referring Provider:  Jonah Blueeborah Johnson, MD  History of Present Illness: [name] is a 32 y.o. female who presents for evaluation of a ventral hernia.  She has actually been referred before and she reports that she has missed all her previous appointments due to her previous job and needing to get childcare for her children and she did not have insurance.  Now she has insurance and does not have a job so she was able to come to the appointment.  She reports that she's had multiple c-sections and her last one was about 2 years ago.  She has had a hernia at her c-section site since then.  The hernia has become more symptomatic with time.  The main symptom she has is lower abdominal discomfort from the hernia bulging.  She also feels that as it bulges more, there is tugging from the upper abdomen as well.  She denies any obstructive symptoms and particularly denies any significant constipation, nausea, or vomiting.  She is able to eat well, though recently she's had some cold symptoms and her appetite has not been as good.  She reports that when it first started, the hernia was self reducible when she would lie flat in bed.  Now it stays bulging.  She has not tried to push it back in.    Past Medical History: Past Medical History:  Diagnosis Date  . Anemia   . Anxiety 11/21/2011  . Blood transfusion complicating pregnancy   . Cervical lymphadenopathy 04/29/2017  . Complication of anesthesia    Swelling from some drug used for anesthesia. Does not  know name.  . Depression   . Genital warts   . Genital warts   . HPV test positive   . Immunization due 05/26/2017  . Preterm labor   . RhD negative   . Tobacco dependence 04/29/2017  . TOBACCO USER 04/22/2009   Qualifier: Diagnosis of  By: Knox Royaltydell, Erin    . Ventral hernia without obstruction or gangrene 04/29/2017     Past Surgical History: Past Surgical History:  Procedure  Laterality Date  . CESAREAN SECTION N/A 07/03/2015   Procedure: CESAREAN SECTION;  Surgeon: Levie HeritageJacob J Stinson, DO;  Location: [facility];  Service: Obstetrics;  Laterality: N/A;  . DILATION AND CURETTAGE OF UTERUS    . FRACTURE SURGERY Right    pinky finger  . fractured finger  2001   open reduction  . THERAPEUTIC ABORTION    . TUBAL LIGATION      Home Medications: Prior to Admission medications   None    Allergies: Allergies  Allergen Reactions  . Other Anaphylaxis    Childhood reaction, finger surgery, total body swelling to anesthesia  medication  . Latex Itching and Swelling    Social History:  reports that she has been smoking cigarettes.  She has a 2.00 pack-year smoking history. she has never used smokeless tobacco. She reports that she uses drugs. Drug: Marijuana. She reports that she does not drink alcohol.   Family History: Family History  Problem Relation Age of Onset  . Colon cancer Mother   . Heart disease Father   . Hyperlipidemia Father   . Kidney disease Father   . Alcohol abuse Father     Review of Systems: Review of Systems  Constitutional: Negative for chills and fever.  HENT: Negative for hearing loss.   Respiratory: Negative for shortness of breath.   Cardiovascular: Negative for chest  pain.  Gastrointestinal: Positive for abdominal pain. Negative for constipation, diarrhea, nausea and vomiting.  Genitourinary: Negative for dysuria.  Musculoskeletal: Negative for myalgias.  Skin: Negative for rash.  Neurological: Negative for dizziness.  Psychiatric/Behavioral: Negative for depression.  All other systems reviewed and are negative.   Physical Exam BP 100/66   Pulse 82   Temp 98 F (36.7 C) (Oral)   Ht 4\' 11"  (1.499 m)   Wt 53.1 kg (117 lb)   LMP 09/11/2017   BMI 23.63 kg/m  CONSTITUTIONAL: No acute distress HEENT:  Normocephalic, atraumatic, extraocular motion intact. NECK: Trachea is midline, and there is no jugular venous distension.   RESPIRATORY:  Lungs are clear, and breath sounds are equal bilaterally. Normal respiratory effort without pathologic use of accessory muscles. CARDIOVASCULAR: Heart is regular without murmurs, gallops, or rubs. GI: The abdomen is soft, non-distended, with mild discomfort to palpation over the low abdomen at the hernia.  The patient has a partially reducible large ventral hernia just superior to her prior c-section scar. The hernia sac contains small bowel. MUSCULOSKELETAL:  Normal muscle strength and tone in all four extremities.  No peripheral edema or cyanosis. SKIN: Skin turgor is normal. There are no pathologic skin lesions.  NEUROLOGIC:  Motor and sensation is grossly normal.  Cranial nerves are grossly intact. PSYCH:  Alert and oriented to person, place and time. Affect is normal.  Laboratory Analysis: None recently  Imaging: None recently  Assessment and Plan: This is a 32 y.o. female who presents with an incisional ventral hernia.  Discussed with the patient the options for laparoscopic vs open ventral hernia repair with mesh.  She has opted for an open repair.  She would rather not have more new scars from port sites if there is any chance that the procedure would have to be converted to open.  Instead she would rather just use the same c-section scar.  Discussed the risks of bleeding, infection, injury to surrounding structures, particularly bowel that's in the hernia sac, risk of not being able to use mesh to reinforce the repair.  She's willing to proceed.  Discussed that this could be a possible outpatient procedure or an overnight stay for pain control.  She understands this plan and all of her questions have been answered.  She will be scheduled for 11/03/17.  Face-to-face time spent with the patient and care providers was 60 minutes, with more than 50% of the time spent counseling, educating, and coordinating care of the patient.     Howie Ill, MD [facility]

## 2017-10-14 ENCOUNTER — Telehealth: Payer: Self-pay | Admitting: Surgery

## 2017-10-14 NOTE — Telephone Encounter (Signed)
Pt advised of pre op date/time and sx date. Sx: 11/03/17 with Dr Piscoya-open ventral hernia repair with mesh.  Pre op: 10/27/17 between 9-1:00pm-telephone interview.   Patient made aware to call [phone], between 1-3:00pm the day before surgery, to find out what time to arrive.

## 2017-10-23 ENCOUNTER — Ambulatory Visit (INDEPENDENT_AMBULATORY_CARE_PROVIDER_SITE_OTHER): Payer: Self-pay | Admitting: Family Medicine

## 2017-10-23 ENCOUNTER — Encounter: Payer: Self-pay | Admitting: Family Medicine

## 2017-10-23 ENCOUNTER — Other Ambulatory Visit: Payer: Self-pay

## 2017-10-23 VITALS — BP 102/68 | HR 81 | Temp 98.0°F | Wt 119.0 lb

## 2017-10-23 DIAGNOSIS — A630 Anogenital (venereal) warts: Secondary | ICD-10-CM

## 2017-10-23 NOTE — Assessment & Plan Note (Signed)
Patient with genital warts resistant to past therapies. Came seeking laser treatment. Unfortunately that therapy is not offered at this clinic. Patient will need to speak with her insurance to see which providers (offering laser therapy) will be covered.

## 2017-10-23 NOTE — Patient Instructions (Signed)
You were seen in clinic for evaluation of your genital warts.  As you have already had several treatments with freezing and TCA acid in the past without relief, we agree that possible laser surgery for these may benefit you.  I would recommend calling your insurance to discuss if dermatology or gynecology procedures would be covered for this and to see if this is a procedure they can perform for you.

## 2017-10-23 NOTE — Progress Notes (Signed)
   HPI 32 year old female who presents for treatment for genital warts. She states that these have been present for several years. She has had multiple treatments for these in the past. She states that cryotherapy has been tried, TCA has been applied, and she tried aldera cream. These have had almost no results. The TCA was somewhat effective but she states that the pain from the applications became too much and she chose to discontinue the treatment. At this point she was prescribed the aldera cream and she states that despite being very expensive it had no effect. She was referred to this clinic by her PCP. She is seeking laser treatment as this is the only therapy she has not tried.  Her lesions are becoming quite painful at most times. She states that cosmetically these have also become a problem.   CC: Genital warts   CC, SH/smoking status, and VS noted  Objective: BP 102/68   Pulse 81   Temp 98 F (36.7 C) (Oral)   Wt 119 lb (54 kg)   SpO2 99%   BMI 24.04 kg/m   Gen: NAD, alert, cooperative, and pleasant. Latino female resting comfortably in chair GU: Multiple well circumscribed dark discolored growths. Largest growth at 6 o' clock. Multiple smaller growths at 2, 3 o'clock. Ext: No edema, warm Neuro: Alert and oriented, Speech clear, No gross deficits   Assessment and plan:  Genital warts Patient with genital warts resistant to past therapies. Came seeking laser treatment. Unfortunately that therapy is not offered at this clinic. Patient will need to speak with her insurance to see which providers (offering laser therapy) will be covered.   No orders of the defined types were placed in this encounter.   No orders of the defined types were placed in this encounter.    Myrene BuddyJacob Zacharius Funari MD PGY-1 Family Medicine Resident 10/23/2017 4:50 PM   I was present during history physical and assessment plan Agree with above Carney LivingMarshall L Chambliss

## 2017-10-27 ENCOUNTER — Encounter: Payer: Self-pay | Admitting: *Deleted

## 2017-10-27 ENCOUNTER — Other Ambulatory Visit: Payer: Self-pay

## 2017-10-27 ENCOUNTER — Encounter
Admission: RE | Admit: 2017-10-27 | Discharge: 2017-10-27 | Disposition: A | Discharge status: Home / Self Care | Payer: Self-pay | Source: Ambulatory Visit | Attending: Surgery | Admitting: Surgery

## 2017-10-27 HISTORY — DX: Personal history of Methicillin resistant Staphylococcus aureus infection: Z86.14

## 2017-10-27 NOTE — Patient Instructions (Addendum)
Your procedure is scheduled on: 11-03-17 MONDAY Report to Same Day Surgery 2nd floor medical mall [facility] Entrance-take elevator on left to 2nd floor.  Check in with surgery information desk.) To find out your arrival time please call [phone] between 1PM - 3PM on 10-31-17 FRIDAY  Remember: Instructions that are not followed completely may result in serious medical risk, up to and including death, or upon the discretion of your surgeon and anesthesiologist your surgery may need to be rescheduled.    _x___ 1. Do not eat food after midnight the night before your procedure. NO GUM OR CANDY AFTER MIDNIGHT.  You may drink clear liquids up to 2 hours before you are scheduled to arrive at the hospital for your procedure.  Do not drink clear liquids within 2 hours of your scheduled arrival to the hospital.  Clear liquids include  --Water or Apple juice without pulp  --Clear carbohydrate beverage such as ClearFast or Gatorade  --Black Coffee or Clear Tea (No milk, no creamers, do not add anything to the coffee or Tea     __x__ 2. No Alcohol for 24 hours before or after surgery.   __x__3. No Smoking or e-cigarettes for 24 prior to surgery.  Do not use any chewable tobacco products for at least 6 hour prior to surgery   ____  4. Bring all medications with you on the day of surgery if instructed.    __x__ 5. Notify your doctor if there is any change in your medical condition     (cold, fever, infections).    x___6. On the morning of surgery brush your teeth with toothpaste and water.  You may rinse your mouth with mouth wash if you wish.  Do not swallow any toothpaste or mouthwash.   Do not wear jewelry, make-up, hairpins, clips or nail polish.  Do not wear lotions, powders, or perfumes. You may wear deodorant.  Do not shave 48 hours prior to surgery. Men may shave face and neck.  Do not bring valuables to the hospital.    [facility] is not responsible for any belongings or  valuables.               Contacts, dentures or bridgework may not be worn into surgery.  Leave your suitcase in the car. After surgery it may be brought to your room.  For patients admitted to the hospital, discharge time is determined by your  treatment team.  _  Patients discharged the day of surgery will not be allowed to drive home.  You will need someone to drive you home and stay with you the night of your procedure.    Please read over the following fact sheets that you were given:   [facility] Preparing for Surgery and or MRSA Information   _x___ Take anti-hypertensive listed below, cardiac, seizure, asthma, anti-reflux and psychiatric medicines. These include:  1. YOU MAY TAKE YOUR BUSPAR (BUSPIRONE) AM OF SURGERY IF NEEDED WITH A SMALL SIP OF WATER  2.  3.  4.  5.  6.  ____Fleets enema or Magnesium Citrate as directed.   _x___ Use CHG Soap or sage wipes as directed on instruction sheet   ____ Use inhalers on the day of surgery and bring to hospital day of surgery  ____ Stop Metformin and Janumet 2 days prior to surgery.    ____ Take 1/2 of usual insulin dose the night before surgery and none on the morning surgery.   ____ Follow recommendations from  Cardiologist, Pulmonologist or PCP regarding stopping Aspirin, Coumadin, Plavix ,Eliquis, Effient, or Pradaxa, and Pletal.  X____Stop Anti-inflammatories such as Advil, Aleve, IBUPORFEN, Motrin, Naproxen, Naprosyn, Goodies powders or aspirin products NOW-OK to take Tylenol    ____ Stop supplements until after surgery.    ____ Bring C-Pap to the hospital.

## 2017-10-28 ENCOUNTER — Encounter
Admission: RE | Admit: 2017-10-28 | Discharge: 2017-10-28 | Disposition: A | Discharge status: Home / Self Care | Payer: Medicaid Other | Source: Ambulatory Visit | Attending: Surgery | Admitting: Surgery

## 2017-10-28 DIAGNOSIS — Z01812 Encounter for preprocedural laboratory examination: Secondary | ICD-10-CM | POA: Diagnosis present

## 2017-10-28 LAB — TYPE AND SCREEN
ABO/RH(D): O NEG
Antibody Screen: NEGATIVE

## 2017-10-28 LAB — CBC WITH DIFFERENTIAL/PLATELET
Basophils Absolute: 0 10*3/uL (ref 0–0.1)
Basophils Relative: 1 %
EOS ABS: 0.2 10*3/uL (ref 0–0.7)
Eosinophils Relative: 2 %
HEMATOCRIT: 39.2 % (ref 35.0–47.0)
HEMOGLOBIN: 12.8 g/dL (ref 12.0–16.0)
Lymphocytes Relative: 26 %
Lymphs Abs: 2.7 10*3/uL (ref 1.0–3.6)
MCH: 29.9 pg (ref 26.0–34.0)
MCHC: 32.7 g/dL (ref 32.0–36.0)
MCV: 91.6 fL (ref 80.0–100.0)
MONO ABS: 1 10*3/uL — AB (ref 0.2–0.9)
MONOS PCT: 9 %
NEUTROS ABS: 6.5 10*3/uL (ref 1.4–6.5)
NEUTROS PCT: 62 %
Platelets: 379 10*3/uL (ref 150–440)
RBC: 4.28 MIL/uL (ref 3.80–5.20)
RDW: 14.5 % (ref 11.5–14.5)
WBC: 10.5 10*3/uL (ref 3.6–11.0)

## 2017-10-28 LAB — COMPREHENSIVE METABOLIC PANEL
ALK PHOS: 53 U/L (ref 38–126)
ALT: 14 U/L (ref 14–54)
ANION GAP: 9 (ref 5–15)
AST: 20 U/L (ref 15–41)
Albumin: 4.1 g/dL (ref 3.5–5.0)
BUN: 15 mg/dL (ref 6–20)
CALCIUM: 9 mg/dL (ref 8.9–10.3)
CO2: 24 mmol/L (ref 22–32)
Chloride: 106 mmol/L (ref 101–111)
Creatinine, Ser: 0.77 mg/dL (ref 0.44–1.00)
GFR calc non Af Amer: >60 mL/min (ref >60)
Glucose, Bld: 82 mg/dL (ref 65–99)
POTASSIUM: 3.7 mmol/L (ref 3.5–5.1)
Sodium: 139 mmol/L (ref 135–145)
Total Bilirubin: 0.5 mg/dL (ref 0.3–1.2)
Total Protein: 7.2 g/dL (ref 6.5–8.1)

## 2017-10-28 LAB — SURGICAL PCR SCREEN
MRSA, PCR: NEGATIVE
STAPHYLOCOCCUS AUREUS: NEGATIVE

## 2017-10-28 LAB — PROTIME-INR
INR: 0.87
PROTHROMBIN TIME: 11.7 s (ref 11.4–15.2)

## 2017-10-31 ENCOUNTER — Encounter: Payer: Self-pay | Admitting: *Deleted

## 2017-11-02 MED ORDER — CEFAZOLIN SODIUM-DEXTROSE 2-4 GM/100ML-% IV SOLN
2.0000 g | INTRAVENOUS | Status: AC
  Administered 2017-11-03: 2 g via INTRAVENOUS

## 2017-11-03 ENCOUNTER — Other Ambulatory Visit: Payer: Self-pay

## 2017-11-03 ENCOUNTER — Ambulatory Visit: Payer: Medicaid Other | Admitting: Anesthesiology

## 2017-11-03 ENCOUNTER — Encounter: Payer: Self-pay | Admitting: *Deleted

## 2017-11-03 ENCOUNTER — Inpatient Hospital Stay
Admission: RE | Admit: 2017-11-03 | Discharge: 2017-11-09 | DRG: 355 | Disposition: A | Discharge status: Home / Self Care | Payer: Medicaid Other | Source: Ambulatory Visit | Attending: Surgery | Admitting: Surgery

## 2017-11-03 ENCOUNTER — Encounter
Admission: RE | Disposition: A | Discharge status: Home / Self Care | Payer: Self-pay | Source: Ambulatory Visit | Attending: Surgery

## 2017-11-03 DIAGNOSIS — Z91040 Latex allergy status: Secondary | ICD-10-CM

## 2017-11-03 DIAGNOSIS — N736 Female pelvic peritoneal adhesions (postinfective): Secondary | ICD-10-CM | POA: Diagnosis present

## 2017-11-03 DIAGNOSIS — T40605A Adverse effect of unspecified narcotics, initial encounter: Secondary | ICD-10-CM | POA: Diagnosis not present

## 2017-11-03 DIAGNOSIS — K432 Incisional hernia without obstruction or gangrene: Principal | ICD-10-CM | POA: Diagnosis present

## 2017-11-03 DIAGNOSIS — F17210 Nicotine dependence, cigarettes, uncomplicated: Secondary | ICD-10-CM | POA: Diagnosis present

## 2017-11-03 DIAGNOSIS — Z884 Allergy status to anesthetic agent status: Secondary | ICD-10-CM

## 2017-11-03 DIAGNOSIS — F329 Major depressive disorder, single episode, unspecified: Secondary | ICD-10-CM | POA: Diagnosis present

## 2017-11-03 DIAGNOSIS — F419 Anxiety disorder, unspecified: Secondary | ICD-10-CM | POA: Diagnosis present

## 2017-11-03 DIAGNOSIS — Z91410 Personal history of adult physical and sexual abuse: Secondary | ICD-10-CM

## 2017-11-03 DIAGNOSIS — Z590 Homelessness: Secondary | ICD-10-CM

## 2017-11-03 DIAGNOSIS — K5903 Drug induced constipation: Secondary | ICD-10-CM | POA: Diagnosis not present

## 2017-11-03 HISTORY — PX: VENTRAL HERNIA REPAIR: SHX424

## 2017-11-03 HISTORY — DX: Liver and biliary tract disorders in pregnancy, unspecified trimester: O26.619

## 2017-11-03 LAB — URINE DRUG SCREEN, QUALITATIVE (ARMC ONLY)
Amphetamines, Ur Screen: NOT DETECTED
BARBITURATES, UR SCREEN: NOT DETECTED
Benzodiazepine, Ur Scrn: NOT DETECTED
CANNABINOID 50 NG, UR ~~LOC~~: POSITIVE — AB
Cocaine Metabolite,Ur ~~LOC~~: NOT DETECTED
MDMA (Ecstasy)Ur Screen: NOT DETECTED
Methadone Scn, Ur: NOT DETECTED
Opiate, Ur Screen: NOT DETECTED
Phencyclidine (PCP) Ur S: NOT DETECTED
TRICYCLIC, UR SCREEN: NOT DETECTED

## 2017-11-03 LAB — POCT PREGNANCY, URINE: Preg Test, Ur: NEGATIVE

## 2017-11-03 LAB — ABO/RH: ABO/RH(D): O NEG

## 2017-11-03 SURGERY — REPAIR, HERNIA, VENTRAL
Anesthesia: General

## 2017-11-03 MED ORDER — KETOROLAC TROMETHAMINE 30 MG/ML IJ SOLN
INTRAMUSCULAR | Status: DC | PRN
  Administered 2017-11-03: 30 mg via INTRAVENOUS

## 2017-11-03 MED ORDER — GABAPENTIN 300 MG PO CAPS
ORAL_CAPSULE | ORAL | Status: AC
  Administered 2017-11-03: 300 mg
  Filled 2017-11-03: qty 1

## 2017-11-03 MED ORDER — ACETAMINOPHEN 500 MG PO TABS
1000.0000 mg | ORAL_TABLET | Freq: Four times a day (QID) | ORAL | Status: DC
  Administered 2017-11-03 – 2017-11-09 (×22): 1000 mg via ORAL
  Filled 2017-11-03 (×24): qty 2

## 2017-11-03 MED ORDER — ONDANSETRON HCL 4 MG/2ML IJ SOLN
INTRAMUSCULAR | Status: DC | PRN
  Administered 2017-11-03: 4 mg via INTRAVENOUS

## 2017-11-03 MED ORDER — CEFAZOLIN SODIUM-DEXTROSE 2-4 GM/100ML-% IV SOLN
INTRAVENOUS | Status: AC
  Filled 2017-11-03: qty 100

## 2017-11-03 MED ORDER — CISATRACURIUM BESYLATE (PF) 200 MG/20ML IV SOLN
10.0000 mg | Freq: Once | INTRAVENOUS | Status: DC
  Filled 2017-11-03: qty 20

## 2017-11-03 MED ORDER — ENOXAPARIN SODIUM 40 MG/0.4ML ~~LOC~~ SOLN
40.0000 mg | SUBCUTANEOUS | Status: DC
  Administered 2017-11-04 – 2017-11-09 (×6): 40 mg via SUBCUTANEOUS
  Filled 2017-11-03 (×6): qty 0.4

## 2017-11-03 MED ORDER — BUPIVACAINE-EPINEPHRINE (PF) 0.5% -1:200000 IJ SOLN
INTRAMUSCULAR | Status: AC
  Filled 2017-11-03: qty 30

## 2017-11-03 MED ORDER — KETAMINE HCL 50 MG/ML IJ SOLN
INTRAMUSCULAR | Status: AC
  Filled 2017-11-03: qty 10

## 2017-11-03 MED ORDER — BUPIVACAINE-EPINEPHRINE (PF) 0.25% -1:200000 IJ SOLN
INTRAMUSCULAR | Status: AC
  Filled 2017-11-03: qty 30

## 2017-11-03 MED ORDER — FAMOTIDINE 20 MG PO TABS
20.0000 mg | ORAL_TABLET | Freq: Once | ORAL | Status: AC
  Administered 2017-11-03: 20 mg via ORAL

## 2017-11-03 MED ORDER — FENTANYL CITRATE (PF) 100 MCG/2ML IJ SOLN
INTRAMUSCULAR | Status: DC | PRN
  Administered 2017-11-03: 25 ug via INTRAVENOUS
  Administered 2017-11-03 (×3): 50 ug via INTRAVENOUS

## 2017-11-03 MED ORDER — PROPOFOL 10 MG/ML IV BOLUS
INTRAVENOUS | Status: AC
  Filled 2017-11-03: qty 20

## 2017-11-03 MED ORDER — FAMOTIDINE 20 MG PO TABS
ORAL_TABLET | ORAL | Status: AC
  Filled 2017-11-03: qty 1

## 2017-11-03 MED ORDER — FENTANYL CITRATE (PF) 100 MCG/2ML IJ SOLN
INTRAMUSCULAR | Status: AC
  Administered 2017-11-03: 25 ug via INTRAVENOUS
  Filled 2017-11-03: qty 2

## 2017-11-03 MED ORDER — CISATRACURIUM BOLUS VIA INFUSION
10.0000 mg | Freq: Once | INTRAVENOUS | Status: DC
  Filled 2017-11-03 (×2): qty 10

## 2017-11-03 MED ORDER — KETOROLAC TROMETHAMINE 30 MG/ML IJ SOLN
INTRAMUSCULAR | Status: AC
  Filled 2017-11-03: qty 1

## 2017-11-03 MED ORDER — HYDROMORPHONE HCL 1 MG/ML IJ SOLN
0.2500 mg | INTRAMUSCULAR | Status: DC | PRN
  Administered 2017-11-03: 0.25 mg via INTRAVENOUS

## 2017-11-03 MED ORDER — DIPHENHYDRAMINE HCL 50 MG/ML IJ SOLN
INTRAMUSCULAR | Status: DC | PRN
  Administered 2017-11-03: 12.5 mg via INTRAVENOUS

## 2017-11-03 MED ORDER — SEVOFLURANE IN SOLN
RESPIRATORY_TRACT | Status: AC
  Filled 2017-11-03: qty 250

## 2017-11-03 MED ORDER — NEOSTIGMINE METHYLSULFATE 10 MG/10ML IV SOLN
INTRAVENOUS | Status: DC | PRN
  Administered 2017-11-03: 3 mg via INTRAVENOUS

## 2017-11-03 MED ORDER — SODIUM CHLORIDE 0.9 % IV SOLN
INTRAVENOUS | Status: DC | PRN
  Administered 2017-11-03: 30 mL

## 2017-11-03 MED ORDER — LIDOCAINE HCL (CARDIAC) 20 MG/ML IV SOLN
INTRAVENOUS | Status: DC | PRN
  Administered 2017-11-03: 60 mg via INTRAVENOUS

## 2017-11-03 MED ORDER — SODIUM CHLORIDE 0.9 % IJ SOLN
INTRAMUSCULAR | Status: AC
  Filled 2017-11-03: qty 50

## 2017-11-03 MED ORDER — POLYETHYLENE GLYCOL 3350 17 G PO PACK: 17.0000 g | PACK | Freq: Every day | ORAL | Status: DC | PRN

## 2017-11-03 MED ORDER — LACTATED RINGERS IV SOLN
INTRAVENOUS | Status: DC
  Administered 2017-11-03 (×2): via INTRAVENOUS

## 2017-11-03 MED ORDER — DEXAMETHASONE SODIUM PHOSPHATE 10 MG/ML IJ SOLN
INTRAMUSCULAR | Status: DC | PRN
  Administered 2017-11-03: 10 mg via INTRAVENOUS

## 2017-11-03 MED ORDER — HYDROMORPHONE HCL 1 MG/ML IJ SOLN
0.5000 mg | INTRAMUSCULAR | Status: DC | PRN
  Administered 2017-11-03 – 2017-11-04 (×4): 0.5 mg via INTRAVENOUS
  Filled 2017-11-03 (×4): qty 0.5

## 2017-11-03 MED ORDER — ONDANSETRON HCL 4 MG/2ML IJ SOLN
4.0000 mg | Freq: Four times a day (QID) | INTRAMUSCULAR | Status: DC | PRN
  Administered 2017-11-03 – 2017-11-07 (×3): 4 mg via INTRAVENOUS
  Filled 2017-11-03 (×3): qty 2

## 2017-11-03 MED ORDER — ONDANSETRON HCL 4 MG/2ML IJ SOLN: 4.0000 mg | Freq: Once | INTRAMUSCULAR | Status: DC | PRN

## 2017-11-03 MED ORDER — OXYCODONE HCL 5 MG PO TABS
5.0000 mg | ORAL_TABLET | ORAL | Status: DC | PRN
  Administered 2017-11-04: 5 mg via ORAL
  Administered 2017-11-04: 10 mg via ORAL
  Filled 2017-11-03: qty 2
  Filled 2017-11-03: qty 1

## 2017-11-03 MED ORDER — EPHEDRINE SULFATE 50 MG/ML IJ SOLN
INTRAMUSCULAR | Status: DC | PRN
  Administered 2017-11-03: 5 mg via INTRAVENOUS

## 2017-11-03 MED ORDER — DEXAMETHASONE SODIUM PHOSPHATE 10 MG/ML IJ SOLN
INTRAMUSCULAR | Status: AC
  Filled 2017-11-03: qty 1

## 2017-11-03 MED ORDER — ONDANSETRON 4 MG PO TBDP
4.0000 mg | ORAL_TABLET | Freq: Four times a day (QID) | ORAL | Status: DC | PRN
  Administered 2017-11-06: 4 mg via ORAL
  Filled 2017-11-03: qty 1

## 2017-11-03 MED ORDER — LACTATED RINGERS IV SOLN
125.0000 mL/h | INTRAVENOUS | Status: DC
  Administered 2017-11-03 – 2017-11-04 (×2): 125 mL/h via INTRAVENOUS

## 2017-11-03 MED ORDER — MIDAZOLAM HCL 5 MG/5ML IJ SOLN
INTRAMUSCULAR | Status: DC | PRN
  Administered 2017-11-03: 2 mg via INTRAVENOUS

## 2017-11-03 MED ORDER — FENTANYL CITRATE (PF) 100 MCG/2ML IJ SOLN
INTRAMUSCULAR | Status: AC
  Filled 2017-11-03: qty 2

## 2017-11-03 MED ORDER — HYDROMORPHONE HCL 1 MG/ML IJ SOLN
INTRAMUSCULAR | Status: AC
  Administered 2017-11-03: 0.25 mg via INTRAVENOUS
  Filled 2017-11-03: qty 1

## 2017-11-03 MED ORDER — GABAPENTIN 300 MG PO CAPS: 300.0000 mg | ORAL_CAPSULE | ORAL | Status: DC

## 2017-11-03 MED ORDER — ACETAMINOPHEN 500 MG PO TABS: 1000.0000 mg | ORAL_TABLET | ORAL | Status: DC

## 2017-11-03 MED ORDER — CISATRACURIUM BESYLATE (PF) 10 MG/5ML IV SOLN
INTRAVENOUS | Status: DC | PRN
  Administered 2017-11-03: 2 mg via INTRAVENOUS
  Administered 2017-11-03: 4 mg via INTRAVENOUS
  Administered 2017-11-03 (×2): 2 mg via INTRAVENOUS

## 2017-11-03 MED ORDER — MIDAZOLAM HCL 2 MG/2ML IJ SOLN
INTRAMUSCULAR | Status: AC
  Filled 2017-11-03: qty 2

## 2017-11-03 MED ORDER — FENTANYL CITRATE (PF) 100 MCG/2ML IJ SOLN
25.0000 ug | INTRAMUSCULAR | Status: AC | PRN
  Administered 2017-11-03 (×6): 25 ug via INTRAVENOUS

## 2017-11-03 MED ORDER — BUPIVACAINE LIPOSOME 1.3 % IJ SUSP
INTRAMUSCULAR | Status: AC
  Filled 2017-11-03: qty 20

## 2017-11-03 MED ORDER — CHLORHEXIDINE GLUCONATE CLOTH 2 % EX PADS: 6.0000 | MEDICATED_PAD | Freq: Once | CUTANEOUS | Status: DC

## 2017-11-03 MED ORDER — PROPOFOL 10 MG/ML IV BOLUS
INTRAVENOUS | Status: DC | PRN
  Administered 2017-11-03: 30 mg via INTRAVENOUS
  Administered 2017-11-03: 200 mg via INTRAVENOUS

## 2017-11-03 MED ORDER — GLYCOPYRROLATE 0.2 MG/ML IJ SOLN
INTRAMUSCULAR | Status: DC | PRN
  Administered 2017-11-03: 0.6 mg via INTRAVENOUS

## 2017-11-03 MED ORDER — PROPOFOL 500 MG/50ML IV EMUL
INTRAVENOUS | Status: DC | PRN
  Administered 2017-11-03: 25 ug/kg/min via INTRAVENOUS

## 2017-11-03 MED ORDER — BUPIVACAINE-EPINEPHRINE 0.5% -1:200000 IJ SOLN
INTRAMUSCULAR | Status: DC | PRN
  Administered 2017-11-03: 30 mL

## 2017-11-03 MED ORDER — ACETAMINOPHEN 500 MG PO TABS
ORAL_TABLET | ORAL | Status: AC
  Administered 2017-11-03: 1000 mg
  Filled 2017-11-03: qty 2

## 2017-11-03 MED ORDER — LIDOCAINE HCL (PF) 2 % IJ SOLN
INTRAMUSCULAR | Status: AC
  Filled 2017-11-03: qty 10

## 2017-11-03 MED ORDER — KETOROLAC TROMETHAMINE 30 MG/ML IJ SOLN
30.0000 mg | Freq: Four times a day (QID) | INTRAMUSCULAR | Status: DC
  Administered 2017-11-04 – 2017-11-06 (×11): 30 mg via INTRAVENOUS
  Filled 2017-11-03 (×12): qty 1

## 2017-11-03 MED ORDER — PANTOPRAZOLE SODIUM 40 MG IV SOLR
40.0000 mg | Freq: Every day | INTRAVENOUS | Status: DC
  Administered 2017-11-04 – 2017-11-07 (×5): 40 mg via INTRAVENOUS
  Filled 2017-11-03 (×6): qty 40

## 2017-11-03 MED ORDER — DIPHENHYDRAMINE HCL 50 MG/ML IJ SOLN
INTRAMUSCULAR | Status: AC
  Filled 2017-11-03: qty 1

## 2017-11-03 SURGICAL SUPPLY — 32 items
ADH SKN CLS APL DERMABOND .7 (GAUZE/BANDAGES/DRESSINGS) ×1
CANISTER SUCT 1200ML W/VALVE (MISCELLANEOUS) ×3 IMPLANT
CHLORAPREP W/TINT 26ML (MISCELLANEOUS) ×3 IMPLANT
DERMABOND ADVANCED (GAUZE/BANDAGES/DRESSINGS) ×2
DERMABOND ADVANCED .7 DNX12 (GAUZE/BANDAGES/DRESSINGS) ×1 IMPLANT
DRAPE LAPAROTOMY 100X77 ABD (DRAPES) ×3 IMPLANT
ELECT CAUTERY BLADE 6.4 (BLADE) ×3 IMPLANT
ELECT REM PT RETURN 9FT ADLT (ELECTROSURGICAL) ×3
ELECTRODE REM PT RTRN 9FT ADLT (ELECTROSURGICAL) ×1 IMPLANT
GLOVE PROTEXIS LATEX SZ 7.5 (GLOVE) ×6 IMPLANT
GLOVE SURG LATEX 7.5 PF (GLOVE) ×1 IMPLANT
GLOVE SURG SYN 7.0 (GLOVE) ×6 IMPLANT
GLOVE SURG SYN 7.0 PF PI (GLOVE) ×1 IMPLANT
GOWN STRL REUS W/ TWL LRG LVL3 (GOWN DISPOSABLE) ×2 IMPLANT
GOWN STRL REUS W/TWL LRG LVL3 (GOWN DISPOSABLE) ×12
NEEDLE HYPO 22GX1.5 SAFETY (NEEDLE) ×3 IMPLANT
NS IRRIG 500ML POUR BTL (IV SOLUTION) ×3 IMPLANT
PACK BASIN MINOR ARMC (MISCELLANEOUS) ×3 IMPLANT
SUT ETHIBOND 0 (SUTURE) ×1 IMPLANT
SUT ETHILON 3-0 FS-10 30 BLK (SUTURE) ×6
SUT MNCRL AB 4-0 PS2 18 (SUTURE) ×3 IMPLANT
SUT PROLENE 2 0 SH DA (SUTURE) ×2 IMPLANT
SUT SILK 0 (SUTURE) ×3
SUT SILK 0 30XBRD TIE 6 (SUTURE) ×1 IMPLANT
SUT SILK 0 SH 30 (SUTURE) ×5 IMPLANT
SUT VIC AB 0 CT1 36 (SUTURE) ×4 IMPLANT
SUT VIC AB 0 SH 27 (SUTURE) ×3 IMPLANT
SUT VIC AB 3-0 SH 27 (SUTURE) ×9
SUT VIC AB 3-0 SH 27X BRD (SUTURE) ×3 IMPLANT
SUTURE EHLN 3-0 FS-10 30 BLK (SUTURE) IMPLANT
SYR 10ML LL (SYRINGE) ×3 IMPLANT
TRAY FOLEY CATH SILVER 16FR LF (SET/KITS/TRAYS/PACK) ×2 IMPLANT

## 2017-11-03 NOTE — Transfer of Care (Signed)
Immediate Anesthesia Transfer of Care Note  Patient: [name]  Procedure(s) Performed: HERNIA REPAIR VENTRAL ADULT (N/A )  Patient Location: PACU  Anesthesia Type:General  Level of Consciousness: sedated and patient cooperative  Airway & Oxygen Therapy: Patient Spontanous Breathing  Post-op Assessment: Report given to RN and Post -op Vital signs reviewed and stable  Post vital signs: Reviewed and stable  Last Vitals:  Vitals Value Taken Time  BP 108/62 11/03/2017  6:56 PM  Temp    Pulse 73 11/03/2017  6:56 PM  Resp 23 11/03/2017  6:56 PM  SpO2 100 % 11/03/2017  6:56 PM  Vitals shown include unvalidated device data.  Last Pain:  Vitals:   11/03/17 1319  TempSrc: Oral         Complications: No apparent anesthesia complications

## 2017-11-03 NOTE — Interval H&P Note (Signed)
History and Physical Interval Note:  11/03/2017 2:25 PM  [name]  has presented today for surgery, with the diagnosis of VENTRAL HERNIA  The various methods of treatment have been discussed with the patient and family. Discussed with the patient that although her incisional hernia is due to her prior cesarean section surgery, the hernia extends superiorly to the level of the umbilicus.  Thus a midline vertical incision would be used instead of the prior Pfannenstiel incision.  Also discussed the option for laparoscopic repair, but the patient preferred open repair.  After consideration of risks, benefits and other options for treatment, the patient has consented to  Procedure(s): HERNIA REPAIR VENTRAL ADULT (N/A) INSERTION OF MESH (N/A) as a surgical intervention .  The patient's history has been reviewed, patient examined, no change in status, stable for surgery.  I have reviewed the patient's chart and labs.  Questions were answered to the patient's satisfaction.     Jayion Schneck

## 2017-11-03 NOTE — Op Note (Signed)
Procedure Date:  11/03/2017  Pre-operative Diagnosis:  Incisional ventral hernia  Post-operative Diagnosis:  Incisional ventral hernia  Procedure:  Incisional ventral hernia repair, partial omentectomy, uterine wall repair.  Surgeon:  Howie IllJose Luis Nozomi Mettler, MD  Assistant:  Hulda Marinimothy Oaks, MD.  His assistance was critical due to the significant adhesions and complexity of the hernia.  He was essential in retraction, dissection, and repair of the hernia.  Anesthesia:  General endotracheal  Estimated Blood Loss:  30 ml  Specimens:  None  Complications:  None  Indications for Procedure:  This is a 32 y.o. female who presents with a 2 year history of an incisional hernia from a prior c-section.  She presents for hernia repair.  The risks of bleeding, abscess or infection, injury to surrounding structures, and need for further procedures were all discussed with the patient and she was willing to proceed.  Description of Procedure: The patient was correctly identified in the preoperative area and brought into the operating room.  The patient was placed supine with VTE prophylaxis in place.  Appropriate time-outs were performed.  Anesthesia was induced and the patient was intubated.  Foley catheter was placed.  Appropriate antibiotics were infused.  The abdomen was prepped and draped in a sterile fashion.  A low midline incision was made and from the umbilicus to the low pelvis.  Electrocautery was used to dissect down the subcutaneous tissue.  The hernia sac was encountered over the superior portion of the incision.  This was entered and it contained omentum.  Careful attention was used to dissect the omentum off the hernia sac and off the fascial edges.  The incision was extended superiorly to the top of the umbilicus for better exposure.  The fascial incision was extended inferiorly once assured that there was nothing adhered to the inferior portion of the abdominal wall.  The omentum was significantly  adhered.  There were two hernia sacs noted, one going to the left side and one to the right, each one containing omentum.  Once the omentum was fully mobilized, it was brought out of the abdominal cavity and examined.  Two small sections of omentum were removed in order to prevent internal hernias.  Once the superior portion of the incision was fully dissected and mobilized, we started dissecting the inferior portion.  The uterus was adhered to the left portion of the lower incision, and in order to free it from the abdominal wall, partial uterine myotomy at the anterior wall had to be made.  Once the uterus was freed, the anterior wall was repaired using interrupted figure of eight 0 Vicryl sutures x 4.    The fascial edges were then tested for mobility and they both reached the midline without any significant tension.  The edges were healthy.  Portions of the rectus muscle were visible on either side of the wound, without any evidence fo necrosis.  The peritoneum and anterior sheath were healthy.  It was decided to repair primarily without mesh.  A total of 60 ml of Exparel solution mixed with 0.5% bupivacaine with epi was infiltrated into the peritoneum, fascia, and subcutaneous tissue of the incision.  Then the wound was closed with two #1 PDS sutures in running fashion, with 4 interrupted 0 Vicryl figure of eight sutures distributed along the incision for better support.  The subcutaneous tissue was irrigated and the wound was then closed in two layers using 3-0 Vicryl and 4-0 Monocryl.  The wound was cleaned and sealed with DermaBond.  Foley catheter was removed.  The patient was emerged from anesthesia, extubated, and brought to the recovery room for further management.    The patient tolerated the procedure well and all counts were correct at the end of the case.   Howie Ill, MD

## 2017-11-03 NOTE — Anesthesia Procedure Notes (Signed)
Procedure Name: Intubation Date/Time: 11/03/2017 3:55 PM Performed by: Dionne Bucy, CRNA Pre-anesthesia Checklist: Patient identified, Patient being monitored, Timeout performed, Emergency Drugs available and Suction available Patient Re-evaluated:Patient Re-evaluated prior to induction Oxygen Delivery Method: Circle system utilized Preoxygenation: Pre-oxygenation with 100% oxygen Induction Type: IV induction Ventilation: Mask ventilation without difficulty Laryngoscope Size: Mac and 3 Grade View: Grade I Tube type: Oral Tube size: 7.0 mm Number of attempts: 1 Airway Equipment and Method: Stylet Placement Confirmation: ETT inserted through vocal cords under direct vision,  positive ETCO2 and breath sounds checked- equal and bilateral Secured at: 19 cm Tube secured with: Tape Dental Injury: Teeth and Oropharynx as per pre-operative assessment

## 2017-11-03 NOTE — Anesthesia Preprocedure Evaluation (Signed)
Anesthesia Evaluation  Patient identified by MRN, date of birth, ID band Patient awake    Reviewed: Allergy & Precautions, NPO status , Patient's Chart, lab work & pertinent test results  History of Anesthesia Complications (+) history of anesthetic complications (whole body swelling with anesthesia?)  Airway Mallampati: II       Dental   Pulmonary neg sleep apnea, neg COPD, Current Smoker,           Cardiovascular (-) hypertension(-) Past MI and (-) CHF (-) dysrhythmias (-) Valvular Problems/Murmurs     Neuro/Psych neg Seizures Anxiety Depression    GI/Hepatic Neg liver ROS, neg GERD  ,  Endo/Other  neg diabetes  Renal/GU negative Renal ROS     Musculoskeletal   Abdominal   Peds  Hematology  (+) anemia ,   Anesthesia Other Findings   Reproductive/Obstetrics                             Anesthesia Physical Anesthesia Plan  ASA: II  Anesthesia Plan: General   Post-op Pain Management:    Induction: Intravenous  PONV Risk Score and Plan: Ondansetron and Dexamethasone  Airway Management Planned: Oral ETT  Additional Equipment:   Intra-op Plan:   Post-operative Plan:   Informed Consent: I have reviewed the patients History and Physical, chart, labs and discussed the procedure including the risks, benefits and alternatives for the proposed anesthesia with the patient or authorized representative who has indicated his/her understanding and acceptance.     Plan Discussed with:   Anesthesia Plan Comments:         Anesthesia Quick Evaluation

## 2017-11-03 NOTE — Brief Op Note (Signed)
11/03/2017  7:11 PM  PATIENT:  [name]  32 y.o. female  PRE-OPERATIVE DIAGNOSIS:  Incisional HERNIA  POST-OPERATIVE DIAGNOSIS:  Incisional HERNIA  PROCEDURE:  Procedure(s): HERNIA REPAIR Incisional ADULT (N/A)  SURGEON:  Surgeon(s) and Role:    * Djon Tith, MD - Primary    * Hulda Marinaks, Timothy, MD - Assisting  PHYSICIAN ASSISTANT:   ASSISTANTS: Dr. Hulda Marinimothy Oaks  ANESTHESIA:   general  EBL:  30 mL   BLOOD ADMINISTERED:none  DRAINS: none   LOCAL MEDICATIONS USED:  BUPIVICAINE   SPECIMEN:  No Specimen  DISPOSITION OF SPECIMEN:  N/A  COUNTS:  YES  TOURNIQUET:  * No tourniquets in log *  DICTATION: .Dragon Dictation  PLAN OF CARE: Admit for overnight observation  PATIENT DISPOSITION:  PACU - hemodynamically stable.   Delay start of Pharmacological VTE agent (>24hrs) due to surgical blood loss or risk of bleeding: yes

## 2017-11-03 NOTE — Anesthesia Post-op Follow-up Note (Signed)
Anesthesia QCDR form completed.        

## 2017-11-04 ENCOUNTER — Encounter: Payer: Self-pay | Admitting: Surgery

## 2017-11-04 LAB — BASIC METABOLIC PANEL WITH GFR
Anion gap: 6 (ref 5–15)
BUN: 12 mg/dL (ref 6–20)
CO2: 25 mmol/L (ref 22–32)
Calcium: 8.5 mg/dL — ABNORMAL LOW (ref 8.9–10.3)
Chloride: 108 mmol/L (ref 101–111)
Creatinine, Ser: 0.63 mg/dL (ref 0.44–1.00)
GFR calc Af Amer: >60 mL/min
GFR calc non Af Amer: >60 mL/min
Glucose, Bld: 97 mg/dL (ref 65–99)
Potassium: 3.6 mmol/L (ref 3.5–5.1)
Sodium: 139 mmol/L (ref 135–145)

## 2017-11-04 LAB — CBC
HCT: 36.1 % (ref 35.0–47.0)
Hemoglobin: 12.1 g/dL (ref 12.0–16.0)
MCH: 30.6 pg (ref 26.0–34.0)
MCHC: 33.6 g/dL (ref 32.0–36.0)
MCV: 90.8 fL (ref 80.0–100.0)
Platelets: 341 10*3/uL (ref 150–440)
RBC: 3.97 MIL/uL (ref 3.80–5.20)
RDW: 14.3 % (ref 11.5–14.5)
WBC: 18.9 10*3/uL — ABNORMAL HIGH (ref 3.6–11.0)

## 2017-11-04 LAB — MAGNESIUM: Magnesium: 1.8 mg/dL (ref 1.7–2.4)

## 2017-11-04 MED ORDER — CYCLOBENZAPRINE HCL 10 MG PO TABS
5.0000 mg | ORAL_TABLET | Freq: Three times a day (TID) | ORAL | Status: DC | PRN
  Administered 2017-11-04 – 2017-11-08 (×3): 5 mg via ORAL
  Filled 2017-11-04 (×3): qty 1

## 2017-11-04 MED ORDER — LACTATED RINGERS IV SOLN
INTRAVENOUS | Status: DC
  Administered 2017-11-04 – 2017-11-06 (×5): via INTRAVENOUS

## 2017-11-04 MED ORDER — HYDROMORPHONE HCL 1 MG/ML IJ SOLN
0.5000 mg | INTRAMUSCULAR | Status: DC | PRN
  Administered 2017-11-04 – 2017-11-07 (×6): 0.5 mg via INTRAVENOUS
  Filled 2017-11-04 (×6): qty 0.5

## 2017-11-04 MED ORDER — HYDROMORPHONE HCL 2 MG PO TABS
1.0000 mg | ORAL_TABLET | ORAL | Status: DC | PRN
  Administered 2017-11-05 (×2): 1 mg via ORAL
  Administered 2017-11-05 – 2017-11-09 (×10): 2 mg via ORAL
  Filled 2017-11-04 (×12): qty 1

## 2017-11-04 MED ORDER — ALPRAZOLAM 0.5 MG PO TABS
0.5000 mg | ORAL_TABLET | Freq: Three times a day (TID) | ORAL | Status: DC | PRN
  Administered 2017-11-04 – 2017-11-09 (×6): 0.5 mg via ORAL
  Filled 2017-11-04 (×6): qty 1

## 2017-11-04 NOTE — Progress Notes (Signed)
11/04/2017  Subjective: Patient is 1 Day Post-Op s/p incisional ventral hernia repair.  Despite infusing Exparel, she's been having pain control issues overnight.  Denies any nausea and tolerated clears and is asking for regular diet.  Vital signs: Temp:  [98.1 F (36.7 C)-99 F (37.2 C)] 98.3 F (36.8 C) (04/02 0512) Pulse Rate:  [54-108] 54 (04/02 0512) Resp:  [14-25] 18 (04/02 0512) BP: (92-115)/(58-75) 92/58 (04/02 0512) SpO2:  [95 %-100 %] 100 % (04/02 0512) Weight:  [119 lb (54 kg)] 119 lb (54 kg) (04/01 1319)   Intake/Output: 04/01 0701 - 04/02 0700 In: 2652 [P.O.:180; I.V.:2472] Out: 185 [Urine:155; Blood:30] Last BM Date: 11/02/17  Physical Exam: Constitutional: No acute distress Abdomen:  Soft, non-distended, appropriately tender to palpation surrounding midline incision.  Incision is clean, dry, intact with no evidence of infection.  Labs:  Recent Labs    11/04/17 0504  WBC 18.9*  HGB 12.1  HCT 36.1  PLT 341   Recent Labs    11/04/17 0504  NA 139  K 3.6  CL 108  CO2 25  GLUCOSE 97  BUN 12  CREATININE 0.63  CALCIUM 8.5*   No results for input(s): LABPROT, INR in the last 72 hours.  Imaging: No results found.  Assessment/Plan: 32 yo female s/p incisional ventral hernia repair  --will adjust pain medications so patient should be able to have Tylenol, Toradol, Oxycodone as needed.  Will also add Flexeril for muscle spasms possibly given the fascial sutures placed.  She has IV dilaudid for breakthrough --encouraged ambulation today --will also order abdominal binder for comfort. --advance to regular diet. --likely will stay today for pain control, possible discharge tomorrow.   Howie IllJose Luis Dene Nazir, MD [facility]

## 2017-11-04 NOTE — Anesthesia Postprocedure Evaluation (Signed)
Anesthesia Post Note  Patient: [name]  Procedure(s) Performed: HERNIA REPAIR VENTRAL ADULT (N/A )  Patient location during evaluation: PACU Anesthesia Type: General Level of consciousness: awake and alert and oriented Pain management: pain level controlled Vital Signs Assessment: post-procedure vital signs reviewed and stable Respiratory status: spontaneous breathing, nonlabored ventilation and respiratory function stable Cardiovascular status: blood pressure returned to baseline and stable Postop Assessment: no signs of nausea or vomiting Anesthetic complications: no     Last Vitals:  Vitals:   11/03/17 2033 11/03/17 2109  BP: 115/72 100/63  Pulse: 96 62  Resp: 20 18  Temp: 36.8 C 36.9 C  SpO2: 100% 100%    Last Pain:  Vitals:   11/03/17 2126  TempSrc:   PainSc: 4                  Shekira Drummer

## 2017-11-05 MED ORDER — SODIUM CHLORIDE 0.9 % IV BOLUS
1000.0000 mL | Freq: Once | INTRAVENOUS | Status: AC
Start: 2017-11-05 — End: 2017-11-05
  Administered 2017-11-05: 1000 mL via INTRAVENOUS

## 2017-11-05 MED ORDER — POLYETHYLENE GLYCOL 3350 17 G PO PACK
17.0000 g | PACK | Freq: Every day | ORAL | Status: DC
  Administered 2017-11-05 – 2017-11-09 (×5): 17 g via ORAL
  Filled 2017-11-05 (×5): qty 1

## 2017-11-05 NOTE — Progress Notes (Signed)
MD notified of pt c/o dizziness and light-headedness. BP soft. Orders for 1L NS stat. Will give and continue to monitor.

## 2017-11-06 DIAGNOSIS — K432 Incisional hernia without obstruction or gangrene: Secondary | ICD-10-CM | POA: Diagnosis present

## 2017-11-06 DIAGNOSIS — Z590 Homelessness: Secondary | ICD-10-CM | POA: Diagnosis not present

## 2017-11-06 DIAGNOSIS — T40605A Adverse effect of unspecified narcotics, initial encounter: Secondary | ICD-10-CM | POA: Diagnosis not present

## 2017-11-06 DIAGNOSIS — N736 Female pelvic peritoneal adhesions (postinfective): Secondary | ICD-10-CM | POA: Diagnosis present

## 2017-11-06 DIAGNOSIS — K5903 Drug induced constipation: Secondary | ICD-10-CM | POA: Diagnosis not present

## 2017-11-06 DIAGNOSIS — F329 Major depressive disorder, single episode, unspecified: Secondary | ICD-10-CM | POA: Diagnosis present

## 2017-11-06 DIAGNOSIS — Z91040 Latex allergy status: Secondary | ICD-10-CM | POA: Diagnosis not present

## 2017-11-06 DIAGNOSIS — F17210 Nicotine dependence, cigarettes, uncomplicated: Secondary | ICD-10-CM | POA: Diagnosis present

## 2017-11-06 DIAGNOSIS — Z91410 Personal history of adult physical and sexual abuse: Secondary | ICD-10-CM | POA: Diagnosis not present

## 2017-11-06 DIAGNOSIS — F419 Anxiety disorder, unspecified: Secondary | ICD-10-CM | POA: Diagnosis present

## 2017-11-06 DIAGNOSIS — Z884 Allergy status to anesthetic agent status: Secondary | ICD-10-CM | POA: Diagnosis not present

## 2017-11-06 MED ORDER — IBUPROFEN 400 MG PO TABS
600.0000 mg | ORAL_TABLET | Freq: Four times a day (QID) | ORAL | Status: DC | PRN
  Administered 2017-11-08 – 2017-11-09 (×4): 600 mg via ORAL
  Filled 2017-11-06 (×4): qty 2

## 2017-11-06 MED ORDER — BISACODYL 5 MG PO TBEC
5.0000 mg | DELAYED_RELEASE_TABLET | Freq: Once | ORAL | Status: AC
  Administered 2017-11-06: 5 mg via ORAL
  Filled 2017-11-06: qty 1

## 2017-11-06 MED ORDER — BISACODYL 10 MG RE SUPP
10.0000 mg | Freq: Every day | RECTAL | Status: DC
  Administered 2017-11-07 – 2017-11-08 (×2): 10 mg via RECTAL
  Filled 2017-11-06 (×2): qty 1

## 2017-11-06 MED ORDER — BISACODYL 10 MG RE SUPP
10.0000 mg | Freq: Once | RECTAL | Status: AC
  Administered 2017-11-06: 10 mg via RECTAL
  Filled 2017-11-06: qty 1

## 2017-11-06 MED ORDER — KETOROLAC TROMETHAMINE 30 MG/ML IJ SOLN
30.0000 mg | Freq: Four times a day (QID) | INTRAMUSCULAR | Status: DC
  Administered 2017-11-06 – 2017-11-08 (×6): 30 mg via INTRAVENOUS
  Filled 2017-11-06 (×7): qty 1

## 2017-11-06 NOTE — Progress Notes (Signed)
11/06/2017  Subjective: Patient is 3 Days Post-Op s/p incisional hernia repair.  Continues having pain but is better controlled with dilaudid po.  Has been able to ambulate better.  NO flatus or BM yet.  Vital signs: Temp:  [98.3 F (36.8 C)-98.6 F (37 C)] 98.4 F (36.9 C) (04/04 0458) Pulse Rate:  [67-72] 69 (04/04 0458) Resp:  [18-20] 20 (04/04 0458) BP: (93-114)/(59-69) 93/59 (04/04 0458) SpO2:  [100 %] 100 % (04/04 0458)   Intake/Output: 04/03 0701 - 04/04 0700 In: 1610.93687.2 [P.O.:240; I.V.:1480; IV Piggyback:1967.2] Out: 700 [Urine:700] Last BM Date: 11/03/17  Physical Exam: Constitutional: No acute distress Abdomen:  Soft, nondistended, tender to palpation over midline incision.  Labs:  Recent Labs    11/04/17 0504  WBC 18.9*  HGB 12.1  HCT 36.1  PLT 341   Recent Labs    11/04/17 0504  NA 139  K 3.6  CL 108  CO2 25  GLUCOSE 97  BUN 12  CREATININE 0.63  CALCIUM 8.5*   No results for input(s): LABPROT, INR in the last 72 hours.  Imaging: No results found.  Assessment/Plan: 32 yo female s/p incisional hernia repair  --continue pain medication regimen --hopefully discharge to home today --will give suppository and miralax for constipation, likely related to narcotics.   Howie IllJose Luis Blinda Turek, MD [facility]

## 2017-11-06 NOTE — Progress Notes (Signed)
MD notified of pt increased pain levels. Orders to restart IV toradol.

## 2017-11-07 NOTE — Progress Notes (Signed)
Patient ID: [name], female   DOB: Dec 30, 1985, 30 y.o.   MRN: [id number]     SURGICAL PROGRESS NOTE   Hospital Day(s): 1.   Post op day(s): 4 Days Post-Op.   Interval History: Patient seen and examined, no acute events or new complaints overnight. Patient reports pain is still not controlled with oral pain medication, denies nausea or vomiting.  Vital signs in last 24 hours: [min-max] current  Temp:  [98.6 F (37 C)-99.3 F (37.4 C)] 99.2 F (37.3 C) (04/05 0444) Pulse Rate:  [62-68] 63 (04/05 0444) Resp:  [18] 18 (04/05 0444) BP: (96-114)/(55-67) 97/57 (04/05 0444) SpO2:  [100 %] 100 % (04/05 0444)     Height: 4\' 11"  (149.9 cm) Weight: 54 kg (119 lb) BMI (Calculated): 24.02   Physical Exam:  Constitutional: alert, cooperative and no distress  Respiratory: breathing non-labored at rest  Cardiovascular: regular rate and sinus rhythm  Gastrointestinal: soft, tender on lower quadrants, and non-distended  Labs:  CBC Latest Ref Rng & Units 11/04/2017 10/28/2017 04/19/2017  WBC 3.6 - 11.0 K/uL 18.9(H) 10.5 10.1  Hemoglobin 12.0 - 16.0 g/dL 40.912.1 81.112.8 91.414.0  Hematocrit 35.0 - 47.0 % 36.1 39.2 41.1  Platelets 150 - 440 K/uL 341 379 380   CMP Latest Ref Rng & Units 11/04/2017 10/28/2017 04/29/2017  Glucose 65 - 99 mg/dL 97 82 -  BUN 6 - 20 mg/dL 12 15 -  Creatinine 7.820.44 - 1.00 mg/dL 9.560.63 2.130.77 -  Sodium 086135 - 145 mmol/L 139 139 -  Potassium 3.5 - 5.1 mmol/L 3.6 3.7 -  Chloride 101 - 111 mmol/L 108 106 -  CO2 22 - 32 mmol/L 25 24 -  Calcium 8.9 - 10.3 mg/dL 5.7(Q8.5(L) 9.0 -  Total Protein 6.5 - 8.1 g/dL - 7.2 6.7  Total Bilirubin 0.3 - 1.2 mg/dL - 0.5 -  Alkaline Phos 38 - 126 U/L - 53 -  AST 15 - 41 U/L - 20 -  ALT 14 - 54 U/L - 14 -    Assessment/Plan:  32 y.o. female status post ventral hernia repair. Still with significant pain that cannot be controlled with oral pain medications. Encourage to ambulate. Will continue pain control to be able to discharge patient.   Gae GallopEdgardo  Cintrn-Daz, MD

## 2017-11-08 MED ORDER — PANTOPRAZOLE SODIUM 40 MG PO TBEC
40.0000 mg | DELAYED_RELEASE_TABLET | Freq: Every day | ORAL | Status: DC
  Administered 2017-11-08: 40 mg via ORAL
  Filled 2017-11-08: qty 1

## 2017-11-08 MED ORDER — MAGNESIUM CITRATE PO SOLN
1.0000 | Freq: Once | ORAL | Status: AC
  Administered 2017-11-08: 1 via ORAL
  Filled 2017-11-08: qty 296

## 2017-11-08 MED ORDER — HYDROMORPHONE HCL 2 MG PO TABS: 1.0000 mg | ORAL_TABLET | ORAL | 0 refills | Status: DC | PRN

## 2017-11-08 NOTE — Progress Notes (Signed)
Patient and her boyfriend are concerned that the patient has not had a BM or passed gas since her admission.  Dr Earlene Plateravis notified of this and an order was given for magnesium citrate

## 2017-11-08 NOTE — Progress Notes (Signed)
Patients IV started leaking when flushed earlier today.  The patient did not want me to put in a new IV because she said she was leaving.  She then said later in the day that she was going to spend the night here tonight and discharge tomorrow.  Approval given from Dr Earlene Plateravis to leave the IV out, discontinue toradol and change protonix to po
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
## 2017-11-09 MED ORDER — DOCUSATE SODIUM 100 MG PO CAPS: 100.0000 mg | ORAL_CAPSULE | Freq: Every day | ORAL | 2 refills | Status: DC

## 2017-11-09 NOTE — Progress Notes (Signed)
Patient discharge teaching given, including activity, diet, follow-up appoints, and medications. Patient verbalized understanding of all discharge instructions. IV access was d/c'd. Vitals are stable. Skin is intact except as charted in most recent assessments. Pt refused to be escorted out by, to be driven home by family.  [name]  

## 2017-11-09 NOTE — Discharge Instructions (Signed)
In addition to included general post-operative instructions for Open Hernia Repair,  Diet: Resume home heart healthy diet.   Activity: No heavy lifting >15 pounds (children, pets, laundry, garbage) or strenuous activity until follow-up, but light activity and walking are encouraged. Do not drive or drink alcohol if taking narcotic pain medications.  Wound care: You may shower/get incision wet with soapy water and pat dry (do not rub incisions), but no baths or submerging incision underwater until follow-up.   Medications: Resume all home medications. For mild to moderate pain: acetaminophen (Tylenol) or ibuprofen/naproxen (if no kidney disease). Combining Tylenol with alcohol can substantially increase your risk of causing liver disease. Narcotic pain medications, if prescribed, can be used for severe pain, though may cause nausea, constipation, and drowsiness. Colace stool softener has also been prescribed to reduce your risk for constipation while taking narcotic pain medication. Do not combine Tylenol and Percocet (or similar) within a 6 hour period as Percocet (and similar) contain(s) Tylenol. If you do not need the narcotic pain medication, you do not need to fill the prescription.  Call office [phone]) at any time if any questions, worsening pain, fevers/chills, bleeding, drainage from incision site, or other concerns.

## 2017-11-14 ENCOUNTER — Ambulatory Visit (INDEPENDENT_AMBULATORY_CARE_PROVIDER_SITE_OTHER): Payer: Self-pay | Admitting: Surgery

## 2017-11-14 ENCOUNTER — Encounter: Payer: Self-pay | Admitting: Surgery

## 2017-11-14 ENCOUNTER — Ambulatory Visit
Admission: RE | Admit: 2017-11-14 | Discharge: 2017-11-14 | Disposition: A | Discharge status: Home / Self Care | Payer: Medicaid Other | Source: Ambulatory Visit | Attending: Surgery | Admitting: Surgery

## 2017-11-14 ENCOUNTER — Other Ambulatory Visit
Admission: RE | Admit: 2017-11-14 | Discharge: 2017-11-14 | Disposition: A | Discharge status: Home / Self Care | Payer: Medicaid Other | Source: Ambulatory Visit | Attending: Surgery | Admitting: Surgery

## 2017-11-14 VITALS — BP 140/93 | HR 90 | Temp 98.9°F | Ht 59.0 in | Wt 115.0 lb

## 2017-11-14 DIAGNOSIS — R1084 Generalized abdominal pain: Secondary | ICD-10-CM | POA: Diagnosis present

## 2017-11-14 DIAGNOSIS — Z09 Encounter for follow-up examination after completed treatment for conditions other than malignant neoplasm: Secondary | ICD-10-CM

## 2017-11-14 LAB — CBC WITH DIFFERENTIAL/PLATELET
BASOS PCT: 1 %
Basophils Absolute: 0.1 10*3/uL (ref 0–0.1)
Eosinophils Absolute: 0.3 10*3/uL (ref 0–0.7)
Eosinophils Relative: 3 %
HEMATOCRIT: 38.3 % (ref 35.0–47.0)
HEMOGLOBIN: 12.7 g/dL (ref 12.0–16.0)
LYMPHS ABS: 2.4 10*3/uL (ref 1.0–3.6)
Lymphocytes Relative: 22 %
MCH: 30.1 pg (ref 26.0–34.0)
MCHC: 33.1 g/dL (ref 32.0–36.0)
MCV: 90.9 fL (ref 80.0–100.0)
MONO ABS: 1 10*3/uL — AB (ref 0.2–0.9)
MONOS PCT: 9 %
NEUTROS ABS: 7.5 10*3/uL — AB (ref 1.4–6.5)
NEUTROS PCT: 65 %
Platelets: 491 10*3/uL — ABNORMAL HIGH (ref 150–440)
RBC: 4.21 MIL/uL (ref 3.80–5.20)
RDW: 14.1 % (ref 11.5–14.5)
WBC: 11.3 10*3/uL — ABNORMAL HIGH (ref 3.6–11.0)

## 2017-11-14 LAB — COMPREHENSIVE METABOLIC PANEL
ALBUMIN: 4.5 g/dL (ref 3.5–5.0)
ALT: 26 U/L (ref 14–54)
AST: 24 U/L (ref 15–41)
Alkaline Phosphatase: 56 U/L (ref 38–126)
Anion gap: 7 (ref 5–15)
BUN: 18 mg/dL (ref 6–20)
CHLORIDE: 104 mmol/L (ref 101–111)
CO2: 25 mmol/L (ref 22–32)
Calcium: 9.2 mg/dL (ref 8.9–10.3)
Creatinine, Ser: 0.53 mg/dL (ref 0.44–1.00)
GFR calc Af Amer: >60 mL/min (ref >60)
GFR calc non Af Amer: >60 mL/min (ref >60)
GLUCOSE: 102 mg/dL — AB (ref 65–99)
POTASSIUM: 3.8 mmol/L (ref 3.5–5.1)
Sodium: 136 mmol/L (ref 135–145)
Total Bilirubin: 0.2 mg/dL — ABNORMAL LOW (ref 0.3–1.2)
Total Protein: 8 g/dL (ref 6.5–8.1)

## 2017-11-14 NOTE — Progress Notes (Signed)
11/14/2017  HPI: Patient is status post incisional hernia repair on 11/03/17.  Her postoperative course was complicated with significant peri-incisional pain as well as anxiety.  She was eventually discharged to home on 4/7 and presents today for postop follow-up.  She reports that she is been able to walk better but she still has a significant amount of pain over the abdomen particularly at the incision and surrounding area.  She reports having some nausea and anxiety which contributes to her nausea and did have one episode of emesis.  However she is tolerating a diet and having normal bowel movements which she is no longer constipated.  Reports having some night sweats at home as well  Vital signs: BP (!) 140/93   Pulse 90   Temp 98.9 F (37.2 C) (Oral)   Ht 4\' 11"  (1.499 m)   Wt 115 lb (52.2 kg)   BMI 23.23 kg/m    Physical Exam: Constitutional: No acute distress Abdomen: Soft, nondistended, with tenderness to palpation in the midline around the incision.  The incision itself is clean dry and intact with no evidence of infection and no fluctuance.  Assessment/Plan: 32 year old female status post incisional hernia repair.  - Given the patient's symptoms, as a precaution, will order a CBC and CMP and as well as CT scan of abdomen and pelvis with contrast to make sure there is no intra-abdominal abscess or other pathology that could be contributing to her pain.  I suspect that pain is more related to the sutures pulling on the muscle in the midline and that this along with her anxiety contribute to her overall symptoms. - Patient will follow-up in 2 weeks for another postop check.  We will call her back with the results of her labs and CT scan. -Patient can continue taking her pain medications as prescribed.   Howie IllJose Luis Alica Shellhammer, MD [facility]
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
## 2017-11-14 NOTE — Patient Instructions (Addendum)
We need for you to stop by the lab and CT Scan today.  Please see your follow up appointment listed below.    GENERAL POST-OPERATIVE PATIENT INSTRUCTIONS   WOUND CARE INSTRUCTIONS:  Keep a dry clean dressing on the wound if there is drainage. The initial bandage may be removed after 24 hours.  Once the wound has quit draining you may leave it open to air.  If clothing rubs against the wound or causes irritation and the wound is not draining you may cover it with a dry dressing during the daytime.  Try to keep the wound dry and avoid ointments on the wound unless directed to do so.  If the wound becomes bright red and painful or starts to drain infected material that is not clear, please contact your physician immediately.  If the wound is mildly pink and has a thick firm ridge underneath it, this is normal, and is referred to as a healing ridge.  This will resolve over the next 4-6 weeks.  BATHING: You may shower if you have been informed of this by your surgeon. However, Please do not submerge in a tub, hot tub, or pool until incisions are completely sealed or have been told by your surgeon that you may do so.  DIET:  You may eat any foods that you can tolerate.  It is a good idea to eat a high fiber diet and take in plenty of fluids to prevent constipation.  If you do become constipated you may want to take a mild laxative or take ducolax tablets on a daily basis until your bowel habits are regular.  Constipation can be very uncomfortable, along with straining, after recent surgery.  ACTIVITY:  You are encouraged to cough and deep breath or use your incentive spirometer if you were given one, every 15-30 minutes when awake.  This will help prevent respiratory complications and low grade fevers post-operatively if you had a general anesthetic.  You may want to hug a pillow when coughing and sneezing to add additional support to the surgical area, if you had abdominal or chest surgery, which will  decrease pain during these times.  You are encouraged to walk and engage in light activity for the next two weeks.  You should not lift more than 20 pounds, until 12/01/2017 as it could put you at increased risk for complications.  Twenty pounds is roughly equivalent to a plastic bag of groceries. At that time- Listen to your body when lifting, if you have pain when lifting, stop and then try again in a few days. Soreness after doing exercises or activities of daily living is normal as you get back in to your normal routine.  MEDICATIONS:  Try to take narcotic medications and anti-inflammatory medications, such as tylenol, ibuprofen, naprosyn, etc., with food.  This will minimize stomach upset from the medication.  Should you develop nausea and vomiting from the pain medication, or develop a rash, please discontinue the medication and contact your physician.  You should not drive, make important decisions, or operate machinery when taking narcotic pain medication.  SUNBLOCK Use sun block to incision area over the next year if this area will be exposed to sun. This helps decrease scarring and will allow you avoid a permanent darkened area over your incision.  QUESTIONS:  Please feel free to call our office if you have any questions, and we will be glad to assist you. [phone]

## 2017-11-18 ENCOUNTER — Telehealth: Payer: Self-pay

## 2017-11-18 NOTE — Telephone Encounter (Signed)
Called patient and had to leave her a message.  Patient needs to have her CT Scan done if she continues to have abdominal pain and before she comes to her follow up appointment with Dr. Aleen CampiPiscoya 11/27/2017.

## 2017-11-19 NOTE — Telephone Encounter (Signed)
Called patient and was not able to leave her a voicemail.

## 2017-11-27 ENCOUNTER — Encounter: Payer: Self-pay | Admitting: Surgery

## 2017-11-27 ENCOUNTER — Ambulatory Visit (INDEPENDENT_AMBULATORY_CARE_PROVIDER_SITE_OTHER): Payer: Self-pay | Admitting: Surgery

## 2017-11-27 VITALS — BP 113/77 | HR 101 | Temp 98.3°F | Ht 59.0 in | Wt 115.0 lb

## 2017-11-27 DIAGNOSIS — Z09 Encounter for follow-up examination after completed treatment for conditions other than malignant neoplasm: Secondary | ICD-10-CM

## 2017-11-27 NOTE — Progress Notes (Signed)
11/27/2017  HPI: Patient is s/p ventral hernia repair on 11/03/17.  She was seen last on 4/12 at which time she reported that her pain had improved somewhat but she was still having pain issues.  Labs and CT scan were ordered and her labs were overall unremarkable, and she did not show up for her CT scan and never re-scheduled.    Today she reports that she's been doing better.  Her pain has improved and is no longer sharp-like, but some dull type of discomfort like she did a heavy workout.  Tolerating a diet and having normal bowel movements.  No drainage from the incision.  Vital signs: BP 113/77   Pulse (!) 101   Temp 98.3 F (36.8 C) (Oral)   Ht 4\' 11"  (1.499 m)   Wt 115 lb (52.2 kg)   BMI 23.23 kg/m    Physical Exam: Constitutional: No acute distress Abdomen:  Soft, nondistended, appropriately tender to palpation.  Midline incision is clean, dry, intact and is healing appropriately.  No evidence of recurrent hernia.  Assessment/Plan: 32 yo female s/p ventral hernia repair.  --reassured patient that her labs were overall unremarkable and there is no longer a need for a CT scan.  She is healing well and though it has been a bit of a longer or slower course, she is doing well and her pain and discomfort will continue to improve. --she still has a no heavy lifting or pushing restriction until 5/1.  She is asking about when she can go back to work and it is reasonable to start on 5/1.  Will give her a note for this. --Patient is currently looking for a new PCP and will give her the [facility] number to call to find one in network that works best for her. --patient may follow up prn.   Howie IllJose Luis Tayen Narang, MD [facility]

## 2017-11-27 NOTE — Patient Instructions (Signed)
Please give us a call in case you have any questions or concerns.  GENERAL POST-OPERATIVE PATIENT INSTRUCTIONS   WOUND CARE INSTRUCTIONS:  Keep a dry clean dressing on the wound if there is drainage. The initial bandage may be removed after 24 hours.  Once the wound has quit draining you may leave it open to air.  If clothing rubs against the wound or causes irritation and the wound is not draining you may cover it with a dry dressing during the daytime.  Try to keep the wound dry and avoid ointments on the wound unless directed to do so.  If the wound becomes bright red and painful or starts to drain infected material that is not clear, please contact your physician immediately.  If the wound is mildly pink and has a thick firm ridge underneath it, this is normal, and is referred to as a healing ridge.  This will resolve over the next 4-6 weeks.  BATHING: You may shower if you have been informed of this by your surgeon. However, Please do not submerge in a tub, hot tub, or pool until incisions are completely sealed or have been told by your surgeon that you may do so.  DIET:  You may eat any foods that you can tolerate.  It is a good idea to eat a high fiber diet and take in plenty of fluids to prevent constipation.  If you do become constipated you may want to take a mild laxative or take ducolax tablets on a daily basis until your bowel habits are regular.  Constipation can be very uncomfortable, along with straining, after recent surgery.  ACTIVITY:  You are encouraged to cough and deep breath or use your incentive spirometer if you were given one, every 15-30 minutes when awake.  This will help prevent respiratory complications and low grade fevers post-operatively if you had a general anesthetic.  You may want to hug a pillow when coughing and sneezing to add additional support to the surgical area, if you had abdominal or chest surgery, which will decrease pain during these times.  You are  encouraged to walk and engage in light activity for the next two weeks.  You should not lift more than 20 pounds, until 12/03/2017 as it could put you at increased risk for complications.  Twenty pounds is roughly equivalent to a plastic bag of groceries. At that time- Listen to your body when lifting, if you have pain when lifting, stop and then try again in a few days. Soreness after doing exercises or activities of daily living is normal as you get back in to your normal routine.  MEDICATIONS:  Try to take narcotic medications and anti-inflammatory medications, such as tylenol, ibuprofen, naprosyn, etc., with food.  This will minimize stomach upset from the medication.  Should you develop nausea and vomiting from the pain medication, or develop a rash, please discontinue the medication and contact your physician.  You should not drive, make important decisions, or operate machinery when taking narcotic pain medication.  SUNBLOCK Use sun block to incision area over the next year if this area will be exposed to sun. This helps decrease scarring and will allow you avoid a permanent darkened area over your incision.  QUESTIONS:  Please feel free to call our office if you have any questions, and we will be glad to assist you. [phone]

## 2017-11-28 NOTE — Telephone Encounter (Signed)
Patient was seen yesterday.

## 2017-12-11 ENCOUNTER — Emergency Department (HOSPITAL_COMMUNITY)
Admission: EM | Admit: 2017-12-11 | Discharge: 2017-12-11 | Disposition: A | Discharge status: Home / Self Care | Payer: Medicaid Other | Attending: Emergency Medicine | Admitting: Emergency Medicine

## 2017-12-11 ENCOUNTER — Emergency Department (HOSPITAL_COMMUNITY): Payer: Medicaid Other

## 2017-12-11 ENCOUNTER — Encounter (HOSPITAL_COMMUNITY): Payer: Self-pay | Admitting: Emergency Medicine

## 2017-12-11 DIAGNOSIS — N9489 Other specified conditions associated with female genital organs and menstrual cycle: Secondary | ICD-10-CM | POA: Diagnosis not present

## 2017-12-11 DIAGNOSIS — R1032 Left lower quadrant pain: Secondary | ICD-10-CM | POA: Diagnosis not present

## 2017-12-11 DIAGNOSIS — F17210 Nicotine dependence, cigarettes, uncomplicated: Secondary | ICD-10-CM | POA: Insufficient documentation

## 2017-12-11 DIAGNOSIS — Z9889 Other specified postprocedural states: Secondary | ICD-10-CM | POA: Diagnosis not present

## 2017-12-11 DIAGNOSIS — R1033 Periumbilical pain: Secondary | ICD-10-CM | POA: Diagnosis not present

## 2017-12-11 DIAGNOSIS — F1210 Cannabis abuse, uncomplicated: Secondary | ICD-10-CM | POA: Diagnosis not present

## 2017-12-11 DIAGNOSIS — R1031 Right lower quadrant pain: Secondary | ICD-10-CM | POA: Diagnosis present

## 2017-12-11 LAB — COMPREHENSIVE METABOLIC PANEL
ALT: 11 U/L — ABNORMAL LOW (ref 14–54)
AST: 14 U/L — ABNORMAL LOW (ref 15–41)
Albumin: 4.4 g/dL (ref 3.5–5.0)
Alkaline Phosphatase: 61 U/L (ref 38–126)
Anion gap: 11 (ref 5–15)
BUN: 14 mg/dL (ref 6–20)
CO2: 23 mmol/L (ref 22–32)
Calcium: 9.3 mg/dL (ref 8.9–10.3)
Chloride: 106 mmol/L (ref 101–111)
Creatinine, Ser: 0.57 mg/dL (ref 0.44–1.00)
GFR calc Af Amer: >60 mL/min (ref >60)
GFR calc non Af Amer: >60 mL/min (ref >60)
Glucose, Bld: 88 mg/dL (ref 65–99)
Potassium: 3.9 mmol/L (ref 3.5–5.1)
Sodium: 140 mmol/L (ref 135–145)
Total Bilirubin: 0.6 mg/dL (ref 0.3–1.2)
Total Protein: 7.7 g/dL (ref 6.5–8.1)

## 2017-12-11 LAB — CBC
HCT: 39.9 % (ref 36.0–46.0)
Hemoglobin: 13.4 g/dL (ref 12.0–15.0)
MCH: 30.2 pg (ref 26.0–34.0)
MCHC: 33.6 g/dL (ref 30.0–36.0)
MCV: 89.9 fL (ref 78.0–100.0)
Platelets: 454 10*3/uL — ABNORMAL HIGH (ref 150–400)
RBC: 4.44 MIL/uL (ref 3.87–5.11)
RDW: 13.3 % (ref 11.5–15.5)
WBC: 11.8 10*3/uL — ABNORMAL HIGH (ref 4.0–10.5)

## 2017-12-11 LAB — I-STAT BETA HCG BLOOD, ED (MC, WL, AP ONLY): I-stat hCG, quantitative: <5 m[IU]/mL (ref <5)

## 2017-12-11 LAB — LIPASE, BLOOD: Lipase: 28 U/L (ref 11–51)

## 2017-12-11 MED ORDER — IOPAMIDOL (ISOVUE-300) INJECTION 61%
100.0000 mL | Freq: Once | INTRAVENOUS | Status: AC | PRN
  Administered 2017-12-11: 100 mL via INTRAVENOUS

## 2017-12-11 MED ORDER — MELOXICAM 15 MG PO TABS: 15.0000 mg | ORAL_TABLET | Freq: Every day | ORAL | 0 refills | Status: DC

## 2017-12-11 MED ORDER — IOPAMIDOL (ISOVUE-300) INJECTION 61%
INTRAVENOUS | Status: AC
  Filled 2017-12-11: qty 100

## 2017-12-11 MED ORDER — ONDANSETRON HCL 4 MG/2ML IJ SOLN
4.0000 mg | INTRAMUSCULAR | Status: AC
  Administered 2017-12-11: 4 mg via INTRAVENOUS
  Filled 2017-12-11: qty 2

## 2017-12-11 MED ORDER — FENTANYL CITRATE (PF) 100 MCG/2ML IJ SOLN
50.0000 ug | INTRAMUSCULAR | Status: DC | PRN
  Administered 2017-12-11: 50 ug via INTRAVENOUS
  Filled 2017-12-11: qty 2

## 2017-12-11 MED ORDER — SODIUM CHLORIDE 0.9 % IV BOLUS
1000.0000 mL | Freq: Once | INTRAVENOUS | Status: AC
  Administered 2017-12-11: 1000 mL via INTRAVENOUS
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
## 2017-12-11 NOTE — ED Triage Notes (Addendum)
Patient here from home with complaints of increase right side abdominal pain and nausea that started last night. Recent hernia surgery 1 month ago.

## 2017-12-11 NOTE — Discharge Instructions (Signed)
See your OB/GYN if: Medicine does not help your pain. Your pain comes back. You have new symptoms. You have abnormal vaginal discharge or bleeding, including bleeding after menopause. You have a fever or chills. You are constipated. You have blood in your urine or stool. You have foul-smelling urine. You feel weak or lightheaded. Come to the Emergency Department: You have sudden severe pain. Your pain gets steadily worse. You have severe pain along with fever, nausea, vomiting, or excessive sweating. You lose consciousness.

## 2017-12-11 NOTE — ED Notes (Signed)
Pt aware urine sample is needed 

## 2017-12-12 MED FILL — MELOXICAM 15 MG TABLET: 15 | 15 days supply | Qty: 15 | Fill #0

## 2017-12-12 MED FILL — busPIRone HCL 10 MG TABS: 10 | 30 days supply | Qty: 30 | Fill #1

## 2018-01-05 ENCOUNTER — Ambulatory Visit: Payer: Self-pay | Admitting: Obstetrics and Gynecology

## 2018-03-09 ENCOUNTER — Encounter: Payer: Self-pay | Admitting: Obstetrics and Gynecology

## 2018-03-09 ENCOUNTER — Ambulatory Visit: Payer: Medicaid Other | Admitting: Obstetrics and Gynecology

## 2018-07-15 ENCOUNTER — Telehealth: Payer: Self-pay | Admitting: Internal Medicine

## 2018-07-15 NOTE — Telephone Encounter (Signed)
Patient called to schedule the appointment however, was told that because it has been awhile since the appointment was scheduled the PCP will have to schedule/resubmit the order.  Due to the patient also no showing/rescheduling appointments numerous times.   Please follow up.

## 2018-07-15 NOTE — Telephone Encounter (Signed)
Contacted patient and provided number to reschedule MRI

## 2018-07-15 NOTE — Telephone Encounter (Signed)
Patient called because she would like for her MRI to be rescheduled. She says that she did not go to her last appointment.  Patient states that she prefers a location in [place] or [place].

## 2018-07-15 NOTE — Telephone Encounter (Signed)
Pt can call the scheduling department and reschedule MRI   Pt can call [phone]

## 2018-08-07 ENCOUNTER — Ambulatory Visit: Payer: Medicaid Other | Attending: Internal Medicine | Admitting: Internal Medicine

## 2018-08-07 ENCOUNTER — Other Ambulatory Visit (HOSPITAL_COMMUNITY)
Admission: RE | Admit: 2018-08-07 | Discharge: 2018-08-07 | Disposition: A | Discharge status: Home / Self Care | Payer: Medicaid Other | Source: Ambulatory Visit | Attending: Internal Medicine | Admitting: Internal Medicine

## 2018-08-07 ENCOUNTER — Encounter: Payer: Self-pay | Admitting: Internal Medicine

## 2018-08-07 VITALS — BP 111/62 | HR 72 | Temp 98.9°F | Resp 16 | Wt 117.4 lb

## 2018-08-07 DIAGNOSIS — Z800 Family history of malignant neoplasm of digestive organs: Secondary | ICD-10-CM | POA: Insufficient documentation

## 2018-08-07 DIAGNOSIS — Z91040 Latex allergy status: Secondary | ICD-10-CM | POA: Diagnosis not present

## 2018-08-07 DIAGNOSIS — F17210 Nicotine dependence, cigarettes, uncomplicated: Secondary | ICD-10-CM | POA: Insufficient documentation

## 2018-08-07 DIAGNOSIS — N938 Other specified abnormal uterine and vaginal bleeding: Secondary | ICD-10-CM | POA: Diagnosis present

## 2018-08-07 DIAGNOSIS — Z884 Allergy status to anesthetic agent status: Secondary | ICD-10-CM | POA: Diagnosis not present

## 2018-08-07 DIAGNOSIS — Z8249 Family history of ischemic heart disease and other diseases of the circulatory system: Secondary | ICD-10-CM | POA: Insufficient documentation

## 2018-08-07 DIAGNOSIS — F329 Major depressive disorder, single episode, unspecified: Secondary | ICD-10-CM | POA: Insufficient documentation

## 2018-08-07 DIAGNOSIS — R102 Pelvic and perineal pain: Secondary | ICD-10-CM

## 2018-08-07 DIAGNOSIS — Z79899 Other long term (current) drug therapy: Secondary | ICD-10-CM | POA: Insufficient documentation

## 2018-08-07 DIAGNOSIS — Z124 Encounter for screening for malignant neoplasm of cervix: Secondary | ICD-10-CM | POA: Insufficient documentation

## 2018-08-07 DIAGNOSIS — G8929 Other chronic pain: Secondary | ICD-10-CM | POA: Insufficient documentation

## 2018-08-07 DIAGNOSIS — A630 Anogenital (venereal) warts: Secondary | ICD-10-CM | POA: Diagnosis not present

## 2018-08-07 DIAGNOSIS — R55 Syncope and collapse: Secondary | ICD-10-CM | POA: Insufficient documentation

## 2018-08-07 DIAGNOSIS — K529 Noninfective gastroenteritis and colitis, unspecified: Secondary | ICD-10-CM | POA: Insufficient documentation

## 2018-08-07 MED ORDER — IBUPROFEN 800 MG PO TABS: 800.0000 mg | ORAL_TABLET | Freq: Three times a day (TID) | ORAL | 1 refills | Status: DC | PRN
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
## 2018-08-07 NOTE — Progress Notes (Signed)
Pt states she is having a lot of pain like labor pain to where her bp was dropping to where she passed out  Pt states she is weak from last night and she was vomiting and she is still having lower abdominal pain   Pt states since she had surgery she has been having bad back pain

## 2018-08-08 LAB — COMPREHENSIVE METABOLIC PANEL
A/G RATIO: 2.5 — AB (ref 1.2–2.2)
ALBUMIN: 4.8 g/dL (ref 3.5–5.5)
ALK PHOS: 58 IU/L (ref 39–117)
ALT: 8 IU/L (ref 0–32)
AST: 13 IU/L (ref 0–40)
BUN/Creatinine Ratio: 21 (ref 9–23)
BUN: 13 mg/dL (ref 6–20)
CO2: 24 mmol/L (ref 20–29)
CREATININE: 0.61 mg/dL (ref 0.57–1.00)
Calcium: 9.7 mg/dL (ref 8.7–10.2)
Chloride: 100 mmol/L (ref 96–106)
GFR, EST AFRICAN AMERICAN: 141 mL/min/{1.73_m2} (ref >59)
GFR, EST NON AFRICAN AMERICAN: 122 mL/min/{1.73_m2} (ref >59)
GLOBULIN, TOTAL: 1.9 g/dL (ref 1.5–4.5)
Glucose: 70 mg/dL (ref 65–99)
POTASSIUM: 4.3 mmol/L (ref 3.5–5.2)
Sodium: 141 mmol/L (ref 134–144)
Total Protein: 6.7 g/dL (ref 6.0–8.5)

## 2018-08-08 LAB — CBC
HEMATOCRIT: 38.3 % (ref 34.0–46.6)
HEMOGLOBIN: 12.9 g/dL (ref 11.1–15.9)
MCH: 29.9 pg (ref 26.6–33.0)
MCHC: 33.7 g/dL (ref 31.5–35.7)
MCV: 89 fL (ref 79–97)
Platelets: 403 10*3/uL (ref 150–450)
RBC: 4.31 x10E6/uL (ref 3.77–5.28)
RDW: 11.9 % — ABNORMAL LOW (ref 12.3–15.4)
WBC: 11.2 10*3/uL — ABNORMAL HIGH (ref 3.4–10.8)

## 2018-08-10 ENCOUNTER — Ambulatory Visit (INDEPENDENT_AMBULATORY_CARE_PROVIDER_SITE_OTHER): Payer: Medicaid Other

## 2018-08-10 DIAGNOSIS — R102 Pelvic and perineal pain: Secondary | ICD-10-CM | POA: Diagnosis not present

## 2018-08-10 DIAGNOSIS — N938 Other specified abnormal uterine and vaginal bleeding: Secondary | ICD-10-CM

## 2018-08-10 DIAGNOSIS — G8929 Other chronic pain: Secondary | ICD-10-CM | POA: Diagnosis not present

## 2018-08-11 LAB — CYTOLOGY - PAP
ADEQUACY: ABSENT
Chlamydia: NEGATIVE
Diagnosis: NEGATIVE
HPV: NOT DETECTED
Neisseria Gonorrhea: NEGATIVE
Trichomonas: NEGATIVE

## 2018-08-15 IMAGING — CT CT ABD-PELV W/ CM
3 of 5 series · 16 of 46 positions shown, 18 images · IV contrast (ISOVUE)
Comparison: CT scan of December 01, 2015.

CLINICAL DATA: Acute right-sided abdominal pain.

EXAM:
CT ABDOMEN AND PELVIS WITH CONTRAST
TECHNIQUE: Multidetector CT imaging of the abdomen and pelvis was performed
using the standard protocol following bolus administration of
intravenous contrast.
CONTRAST:  100mL GAFL0L-3FF IOPAMIDOL (GAFL0L-3FF) INJECTION 61%

[Series 2: axial st · axial · 0.68mm/px · z∈[-348,-23]mm · 11 of 79 slices shown, 13 images]
[im 7/79  soft-tissue]
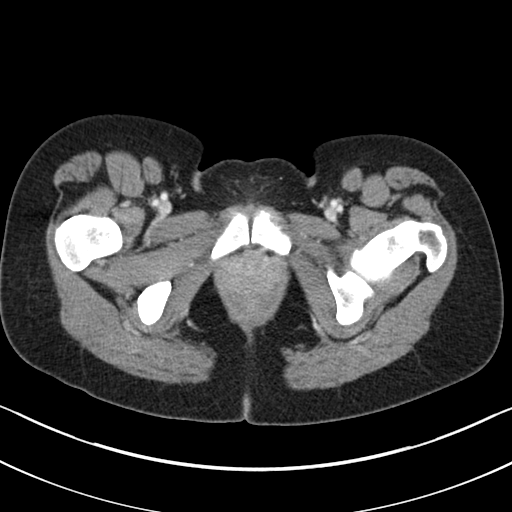
[im 7/79  bone]
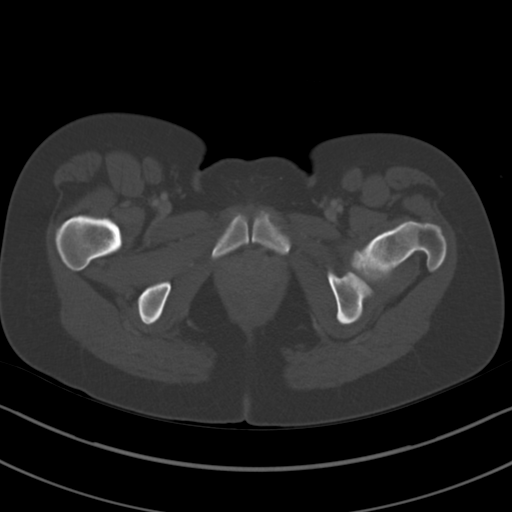
[im 14/79  soft-tissue]
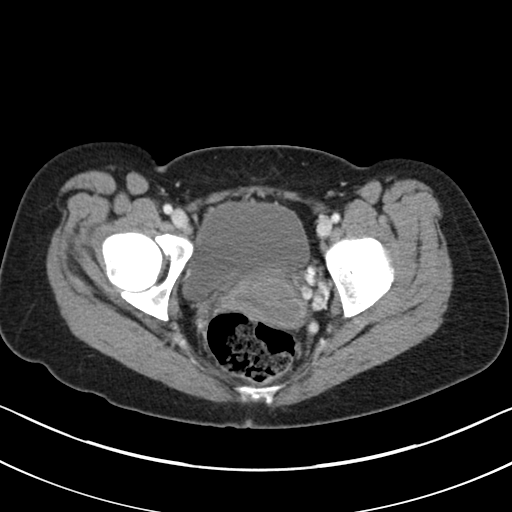
[im 20/79  soft-tissue]
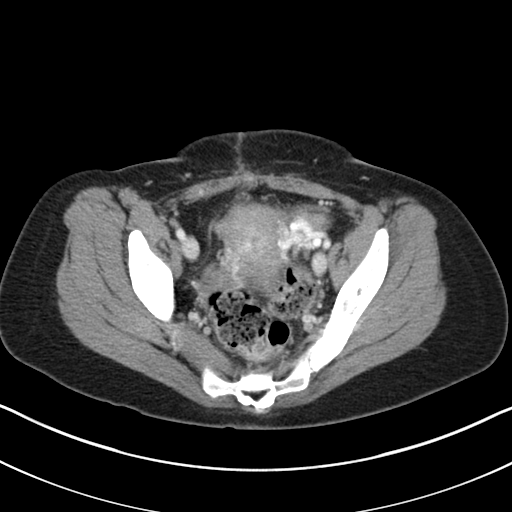
[im 27/79  soft-tissue]
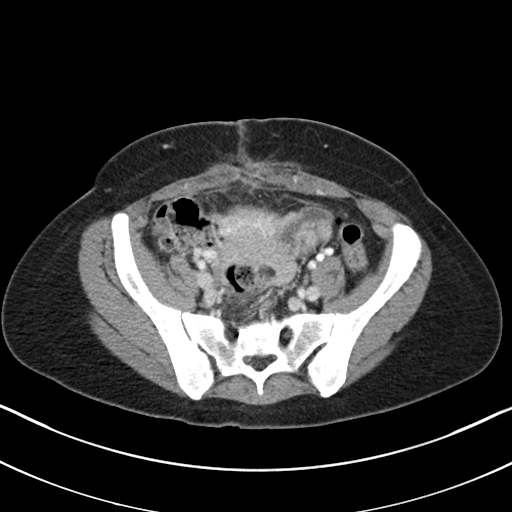
[im 33/79  soft-tissue]
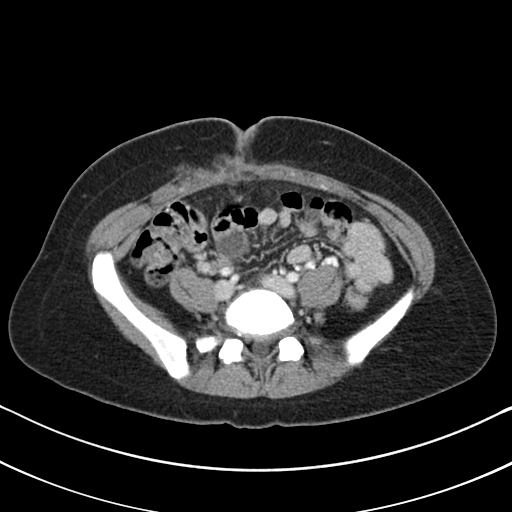
[im 40/79  soft-tissue]
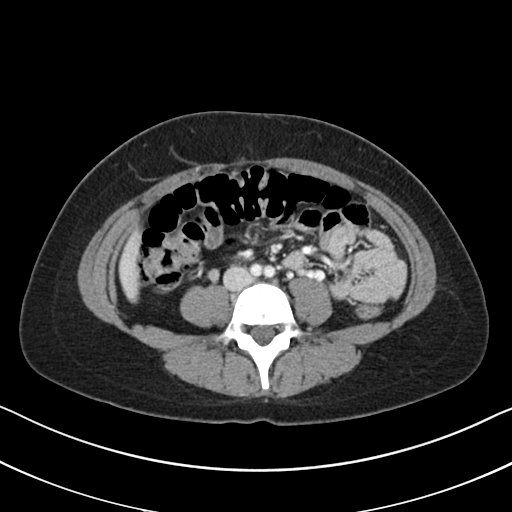
[im 46/79  soft-tissue]
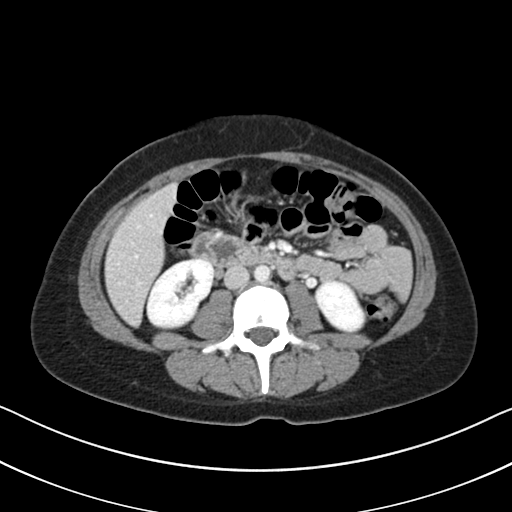
[im 53/79  soft-tissue]
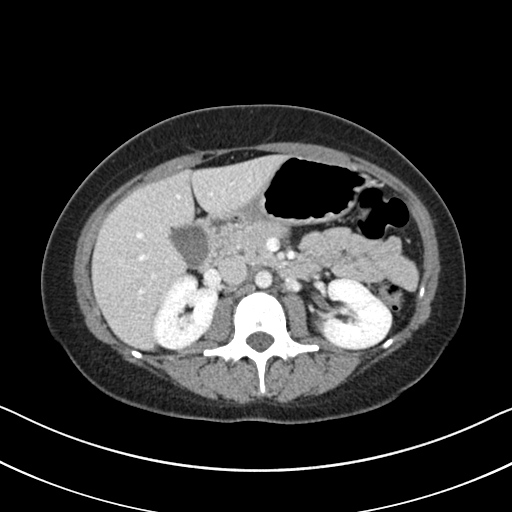
[im 59/79  soft-tissue]
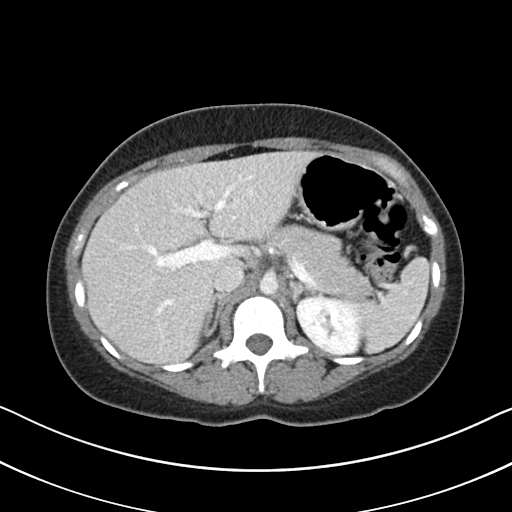
[im 59/79  bone]
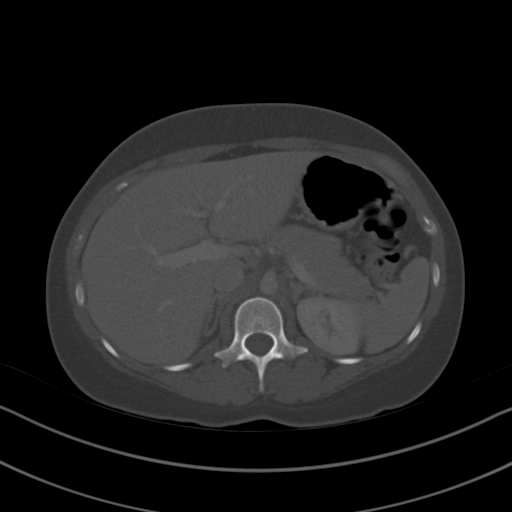
[im 66/79  soft-tissue]
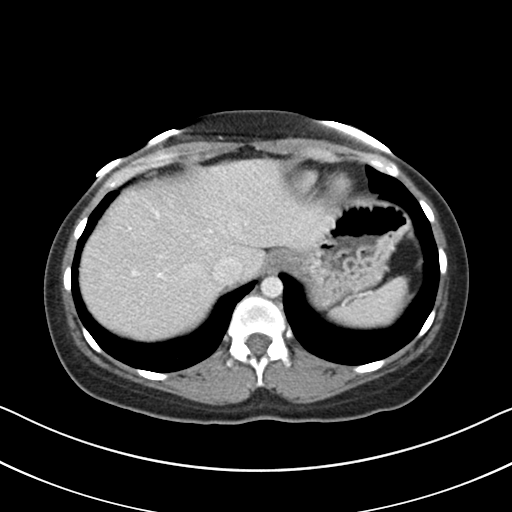
[im 72/79  soft-tissue]
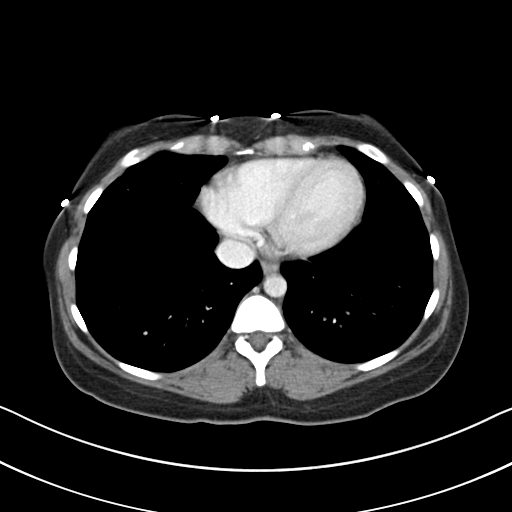

[Series 4: lung bases · axial · 0.68mm/px · z∈[-64,-52]mm · 2 of 45 slices shown]
[im 7/45  bone]
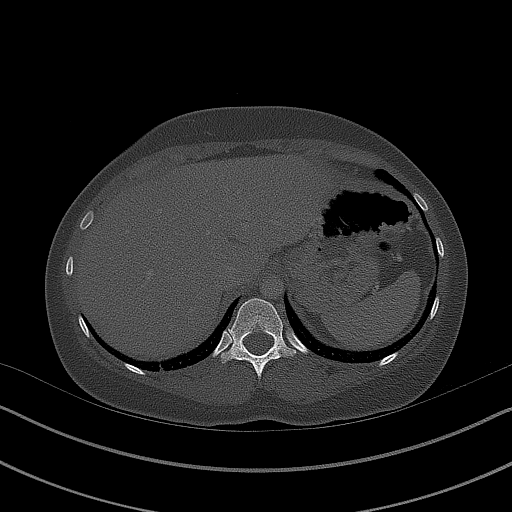
[im 13/45  bone]
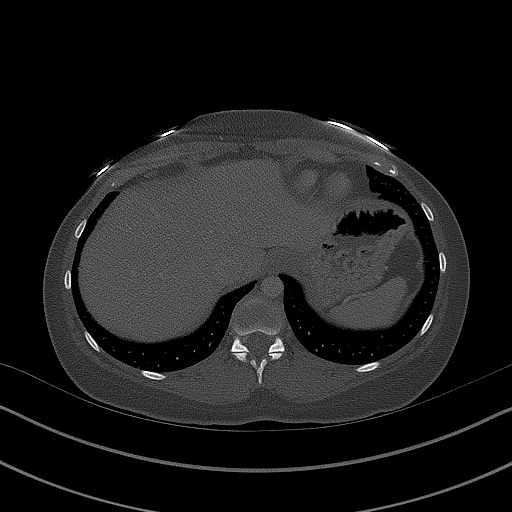

[Series 5: coronal st · coronal · 0.60mm/px · 3 of 72 slices shown]
[im 24/72  soft-tissue]
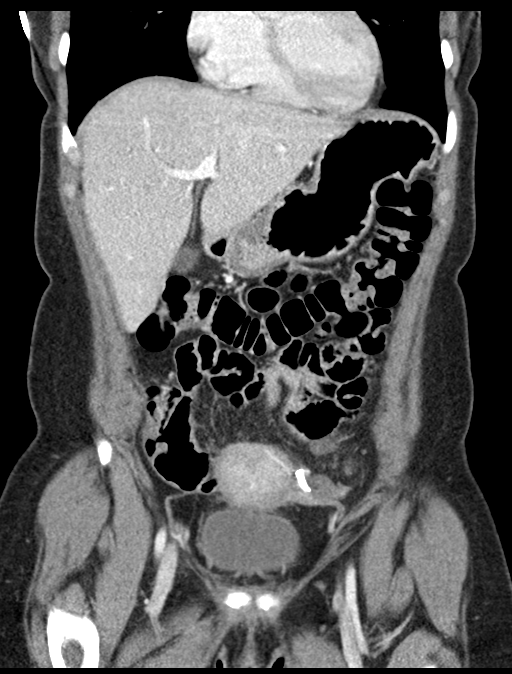
[im 32/72  soft-tissue]
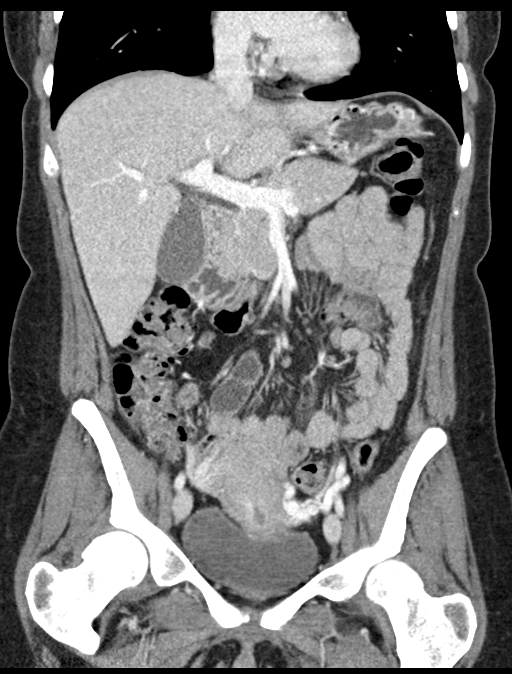
[im 40/72  soft-tissue]
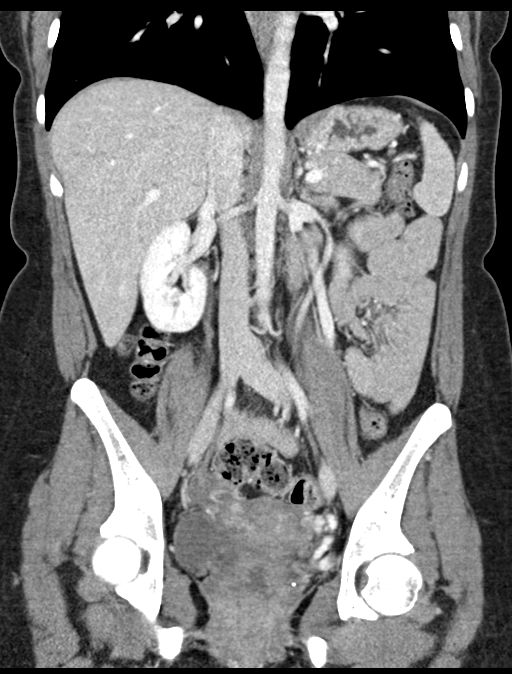

[16 of 46 positions shown; findings below may reference images not displayed]

FINDINGS: Lower chest: No acute abnormality.

Hepatobiliary: No focal liver abnormality is seen. No gallstones,
gallbladder wall thickening, or biliary dilatation.

Pancreas: Unremarkable. No pancreatic ductal dilatation or
surrounding inflammatory changes.

Spleen: Normal in size without focal abnormality.

Adrenals/Urinary Tract: Adrenal glands are unremarkable. Kidneys are
normal, without renal calculi, focal lesion, or hydronephrosis.
Bladder is unremarkable.

Stomach/Bowel: Stomach is within normal limits. Appendix appears
normal. No evidence of bowel wall thickening, distention, or
inflammatory changes.

Vascular/Lymphatic: No adenopathy is noted. Enlarged left pelvic
varices are noted with dilated left ovarian vein consistent with
pelvic congestion syndrome.

Reproductive: Uterus and ovaries are unremarkable.

Other: Status post ventral hernia repair, with several small fat
containing hernias along incisional site.

Musculoskeletal: No acute or significant osseous findings.
IMPRESSION: Enlarged left pelvic varices are noted with dilated left ovarian
vein consistent with pelvic congestion syndrome.

Status post ventral hernia repair, with several small fat containing
hernias along the incisional site.

## 2018-08-24 ENCOUNTER — Telehealth: Payer: Self-pay | Admitting: Internal Medicine

## 2018-08-24 NOTE — Telephone Encounter (Signed)
Pt called to request a call back in regards to her lab results available on Mychart, she would like a call back as soon as [id number].

## 2018-09-01 NOTE — Telephone Encounter (Signed)
Returned pt call and lvm asking pt to give me a call at her earliest convenience  

## 2018-09-10 ENCOUNTER — Encounter: Payer: Self-pay | Admitting: Obstetrics & Gynecology

## 2018-09-10 ENCOUNTER — Other Ambulatory Visit (HOSPITAL_COMMUNITY)
Admission: RE | Admit: 2018-09-10 | Discharge: 2018-09-10 | Disposition: A | Discharge status: Home / Self Care | Payer: Medicaid Other | Source: Ambulatory Visit | Attending: Obstetrics & Gynecology | Admitting: Obstetrics & Gynecology

## 2018-09-10 ENCOUNTER — Ambulatory Visit (INDEPENDENT_AMBULATORY_CARE_PROVIDER_SITE_OTHER): Payer: Medicaid Other | Admitting: Obstetrics & Gynecology

## 2018-09-10 VITALS — BP 102/63 | HR 93 | Resp 16 | Ht 59.0 in | Wt 116.0 lb

## 2018-09-10 DIAGNOSIS — N938 Other specified abnormal uterine and vaginal bleeding: Secondary | ICD-10-CM | POA: Insufficient documentation

## 2018-09-10 NOTE — Progress Notes (Signed)
   Subjective:    Patient ID: [name], female    DOB: May 01, 1986, 33 y.o.   MRN: [id number]  HPI 33 yo single P3 (12, 91, and 76, and 22 yo kids) here today with the issue of 3 months of heavy bleeding, irregular, "bright pink red" versus her usual dark red. She uses super Tampax and has to change within 90 minutes.  She used to have regular periods that lasted 3-5 days and now she bleeds almost daily. She has a lot of pain with her bleeding for several months. It radiates to both inner thighs and then into her pelvis.  She also has genital warts. She has been treated with cryo, TCA, and Aldara.  Her u/s only showed a probable Nabothian cyst  Review of Systems She has had a BTL.    Objective:   Physical Exam Breathing, conversing, and ambulating normally Well nourished, well hydrated Latina, no apparent distress  Abd- benign, s/p repair of rectus diathesis Cervix with small Nabothian cyst normal size and shape, anteverted, mobile, non-tender, normal adnexal exam      Assessment & Plan:  DUB and PCB- check cervical cultures, cbc, and TSH Genital warts resistent to medical treatment- I have offered to surgically remove them Pelvic pain with radiation to her thighs- I have offered a diagnostic laparoscopy I will plan on doing a d&c in the OR also with possible Minerva ablation.

## 2018-09-11 LAB — CBC
HCT: 39.3 % (ref 35.0–45.0)
HEMOGLOBIN: 13.2 g/dL (ref 11.7–15.5)
MCH: 30 pg (ref 27.0–33.0)
MCHC: 33.6 g/dL (ref 32.0–36.0)
MCV: 89.3 fL (ref 80.0–100.0)
MPV: 9.8 fL (ref 7.5–12.5)
Platelets: 359 10*3/uL (ref 140–400)
RBC: 4.4 10*6/uL (ref 3.80–5.10)
RDW: 12.2 % (ref 11.0–15.0)
WBC: 8.6 10*3/uL (ref 3.8–10.8)

## 2018-09-11 LAB — TSH: TSH: 1.12 m[IU]/L

## 2018-09-12 LAB — CERVICOVAGINAL ANCILLARY ONLY
Chlamydia: NEGATIVE
Neisseria Gonorrhea: NEGATIVE

## 2018-09-15 ENCOUNTER — Encounter (HOSPITAL_COMMUNITY): Payer: Self-pay

## 2018-12-10 ENCOUNTER — Encounter: Payer: Self-pay | Admitting: Internal Medicine

## 2018-12-10 MED ORDER — BUSPIRONE HCL 10 MG PO TABS: 10.0000 mg | ORAL_TABLET | Freq: Two times a day (BID) | ORAL | 2 refills | Status: DC

## 2018-12-17 ENCOUNTER — Ambulatory Visit: Payer: Medicaid Other | Admitting: Obstetrics & Gynecology

## 2018-12-17 ENCOUNTER — Encounter: Payer: Self-pay | Admitting: Obstetrics & Gynecology

## 2018-12-17 ENCOUNTER — Other Ambulatory Visit: Payer: Self-pay

## 2018-12-17 VITALS — BP 87/55 | HR 96 | Wt 118.0 lb

## 2018-12-17 DIAGNOSIS — N938 Other specified abnormal uterine and vaginal bleeding: Secondary | ICD-10-CM | POA: Diagnosis not present

## 2018-12-17 DIAGNOSIS — N946 Dysmenorrhea, unspecified: Secondary | ICD-10-CM | POA: Diagnosis not present

## 2018-12-17 DIAGNOSIS — N907 Vulvar cyst: Secondary | ICD-10-CM | POA: Diagnosis not present

## 2018-12-17 DIAGNOSIS — A630 Anogenital (venereal) warts: Secondary | ICD-10-CM | POA: Diagnosis not present

## 2018-12-17 NOTE — Progress Notes (Signed)
   Subjective:    Patient ID: [name], female    DOB: February 10, 1986, 33 y.o.   MRN: [id number]  HPI  33 yo single P4 (12, 65, 13 and 46 yo kids) here for a pre op visit. She is scheduled for a diagnostic laparoscopy, d&c, Minerva ablation, removal of genital warts, and excision of left labial cyst.   She reports her periods are irregular and very painful. She has had a BTL.    Review of Systems She has been monogamous for 10 years. She is a Armed forces training and education officer    Objective:   Physical Exam Breathing, conversing, and ambulating normally Well nourished, well hydrated Latina, no apparent distress  Abd- benign The left labia cyst is small today (about 6 mm, but she says that at times it gets bigger).   There are a few scattered genital warts.     Assessment & Plan:  Dysmenorrhea- plan for diagnostic laparoscopy Menorrhagia, irregular cycle- plan for d&c and Minerva Genital warts resistent to medical treatment. I have agreed to remove them Labia cyst- I will excise this She is aware that Minerva has a 90% satisfaction rate She has had an umbilical hernia repair so I will place my camera in the LUQ.

## 2018-12-17 NOTE — Progress Notes (Signed)
Pt c/o vaginal lesion

## 2018-12-18 ENCOUNTER — Encounter: Payer: Self-pay | Admitting: Internal Medicine

## 2019-01-01 ENCOUNTER — Ambulatory Visit: Payer: Medicaid Other | Admitting: Internal Medicine

## 2019-01-04 ENCOUNTER — Ambulatory Visit: Payer: Medicaid Other | Admitting: Internal Medicine

## 2019-01-06 ENCOUNTER — Encounter (HOSPITAL_BASED_OUTPATIENT_CLINIC_OR_DEPARTMENT_OTHER): Payer: Self-pay | Admitting: *Deleted

## 2019-01-06 ENCOUNTER — Other Ambulatory Visit: Payer: Self-pay

## 2019-01-11 ENCOUNTER — Encounter (HOSPITAL_BASED_OUTPATIENT_CLINIC_OR_DEPARTMENT_OTHER)
Admission: RE | Admit: 2019-01-11 | Discharge: 2019-01-11 | Disposition: A | Discharge status: Home / Self Care | Payer: Medicaid Other | Source: Ambulatory Visit | Attending: Obstetrics & Gynecology | Admitting: Obstetrics & Gynecology

## 2019-01-11 ENCOUNTER — Other Ambulatory Visit: Payer: Self-pay

## 2019-01-11 ENCOUNTER — Other Ambulatory Visit (HOSPITAL_COMMUNITY)
Admission: RE | Admit: 2019-01-11 | Discharge: 2019-01-11 | Disposition: A | Discharge status: Home / Self Care | Payer: Medicaid Other | Source: Ambulatory Visit | Attending: Obstetrics & Gynecology | Admitting: Obstetrics & Gynecology

## 2019-01-11 DIAGNOSIS — B079 Viral wart, unspecified: Secondary | ICD-10-CM | POA: Insufficient documentation

## 2019-01-11 DIAGNOSIS — Z01812 Encounter for preprocedural laboratory examination: Secondary | ICD-10-CM | POA: Insufficient documentation

## 2019-01-11 DIAGNOSIS — Z1159 Encounter for screening for other viral diseases: Secondary | ICD-10-CM | POA: Insufficient documentation

## 2019-01-11 LAB — POCT PREGNANCY, URINE: Preg Test, Ur: NEGATIVE

## 2019-01-11 LAB — SARS CORONAVIRUS 2 BY RT PCR (HOSPITAL ORDER, PERFORMED IN ~~LOC~~ HOSPITAL LAB): SARS Coronavirus 2: NEGATIVE

## 2019-01-11 NOTE — Progress Notes (Signed)
Ensure pre surgery drink given with instructions to complete by 0745 dos, pt verbalized understanding. 

## 2019-01-13 ENCOUNTER — Ambulatory Visit (HOSPITAL_BASED_OUTPATIENT_CLINIC_OR_DEPARTMENT_OTHER): Payer: Medicaid Other | Admitting: Anesthesiology

## 2019-01-13 ENCOUNTER — Other Ambulatory Visit: Payer: Self-pay | Admitting: Obstetrics & Gynecology

## 2019-01-13 ENCOUNTER — Encounter (HOSPITAL_BASED_OUTPATIENT_CLINIC_OR_DEPARTMENT_OTHER)
Admission: RE | Disposition: A | Discharge status: Home / Self Care | Payer: Self-pay | Source: Home / Self Care | Attending: Obstetrics & Gynecology

## 2019-01-13 ENCOUNTER — Other Ambulatory Visit: Payer: Self-pay

## 2019-01-13 ENCOUNTER — Ambulatory Visit (HOSPITAL_BASED_OUTPATIENT_CLINIC_OR_DEPARTMENT_OTHER)
Admission: RE | Admit: 2019-01-13 | Discharge: 2019-01-13 | Disposition: A | Discharge status: Home / Self Care | Payer: Medicaid Other | Attending: Obstetrics & Gynecology | Admitting: Obstetrics & Gynecology

## 2019-01-13 ENCOUNTER — Encounter (HOSPITAL_BASED_OUTPATIENT_CLINIC_OR_DEPARTMENT_OTHER): Payer: Self-pay | Admitting: *Deleted

## 2019-01-13 DIAGNOSIS — N938 Other specified abnormal uterine and vaginal bleeding: Secondary | ICD-10-CM | POA: Diagnosis present

## 2019-01-13 DIAGNOSIS — F17210 Nicotine dependence, cigarettes, uncomplicated: Secondary | ICD-10-CM | POA: Diagnosis not present

## 2019-01-13 DIAGNOSIS — A630 Anogenital (venereal) warts: Secondary | ICD-10-CM | POA: Diagnosis not present

## 2019-01-13 DIAGNOSIS — G8929 Other chronic pain: Secondary | ICD-10-CM | POA: Diagnosis not present

## 2019-01-13 DIAGNOSIS — F419 Anxiety disorder, unspecified: Secondary | ICD-10-CM | POA: Diagnosis not present

## 2019-01-13 DIAGNOSIS — R102 Pelvic and perineal pain: Secondary | ICD-10-CM

## 2019-01-13 DIAGNOSIS — F329 Major depressive disorder, single episode, unspecified: Secondary | ICD-10-CM | POA: Diagnosis not present

## 2019-01-13 DIAGNOSIS — Z79899 Other long term (current) drug therapy: Secondary | ICD-10-CM | POA: Insufficient documentation

## 2019-01-13 DIAGNOSIS — Z791 Long term (current) use of non-steroidal anti-inflammatories (NSAID): Secondary | ICD-10-CM | POA: Diagnosis not present

## 2019-01-13 DIAGNOSIS — Z8614 Personal history of Methicillin resistant Staphylococcus aureus infection: Secondary | ICD-10-CM | POA: Diagnosis not present

## 2019-01-13 DIAGNOSIS — N736 Female pelvic peritoneal adhesions (postinfective): Secondary | ICD-10-CM | POA: Insufficient documentation

## 2019-01-13 HISTORY — PX: LAPAROSCOPY: SHX197

## 2019-01-13 HISTORY — PX: HYSTEROSCOPY W/D&C: SHX1775

## 2019-01-13 HISTORY — PX: LESION REMOVAL: SHX5196

## 2019-01-13 SURGERY — LAPAROSCOPY, DIAGNOSTIC
Anesthesia: General | Site: Vagina

## 2019-01-13 MED ORDER — IBUPROFEN 800 MG PO TABS: 800.0000 mg | ORAL_TABLET | Freq: Three times a day (TID) | ORAL | 0 refills | Status: DC | PRN

## 2019-01-13 MED ORDER — KETOROLAC TROMETHAMINE 30 MG/ML IJ SOLN
INTRAMUSCULAR | Status: AC
  Filled 2019-01-13: qty 1

## 2019-01-13 MED ORDER — ACETAMINOPHEN 325 MG PO TABS: 325.0000 mg | ORAL_TABLET | ORAL | Status: DC | PRN

## 2019-01-13 MED ORDER — DEXMEDETOMIDINE HCL IN NACL 200 MCG/50ML IV SOLN
INTRAVENOUS | Status: AC
  Filled 2019-01-13: qty 50

## 2019-01-13 MED ORDER — SUGAMMADEX SODIUM 200 MG/2ML IV SOLN
INTRAVENOUS | Status: DC | PRN
  Administered 2019-01-13: 110 mg via INTRAVENOUS

## 2019-01-13 MED ORDER — MIDAZOLAM HCL 2 MG/2ML IJ SOLN: 1.0000 mg | INTRAMUSCULAR | Status: DC | PRN

## 2019-01-13 MED ORDER — BUPIVACAINE HCL (PF) 0.5 % IJ SOLN
INTRAMUSCULAR | Status: DC | PRN
  Administered 2019-01-13: 30 mL

## 2019-01-13 MED ORDER — CELECOXIB 200 MG PO CAPS
200.0000 mg | ORAL_CAPSULE | Freq: Once | ORAL | Status: AC
  Administered 2019-01-13: 200 mg via ORAL

## 2019-01-13 MED ORDER — FENTANYL CITRATE (PF) 100 MCG/2ML IJ SOLN
25.0000 ug | INTRAMUSCULAR | Status: DC | PRN
  Administered 2019-01-13 (×2): 50 ug via INTRAVENOUS

## 2019-01-13 MED ORDER — DEXAMETHASONE SODIUM PHOSPHATE 10 MG/ML IJ SOLN
INTRAMUSCULAR | Status: AC
  Filled 2019-01-13: qty 1

## 2019-01-13 MED ORDER — ROCURONIUM BROMIDE 10 MG/ML (PF) SYRINGE
PREFILLED_SYRINGE | INTRAVENOUS | Status: AC
  Filled 2019-01-13: qty 10

## 2019-01-13 MED ORDER — BUPIVACAINE HCL (PF) 0.5 % IJ SOLN
INTRAMUSCULAR | Status: AC
  Filled 2019-01-13: qty 30

## 2019-01-13 MED ORDER — SODIUM CHLORIDE 0.9 % IR SOLN
Status: DC | PRN
  Administered 2019-01-13: 3000 mL

## 2019-01-13 MED ORDER — FENTANYL CITRATE (PF) 100 MCG/2ML IJ SOLN
INTRAMUSCULAR | Status: AC
  Filled 2019-01-13: qty 2

## 2019-01-13 MED ORDER — KETAMINE HCL 100 MG/ML IJ SOLN
INTRAMUSCULAR | Status: AC
  Filled 2019-01-13: qty 1

## 2019-01-13 MED ORDER — MEPERIDINE HCL 25 MG/ML IJ SOLN: 6.2500 mg | INTRAMUSCULAR | Status: DC | PRN

## 2019-01-13 MED ORDER — ACETAMINOPHEN 500 MG PO TABS
1000.0000 mg | ORAL_TABLET | Freq: Once | ORAL | Status: AC
  Administered 2019-01-13: 1000 mg via ORAL

## 2019-01-13 MED ORDER — ONDANSETRON HCL 4 MG/2ML IJ SOLN
INTRAMUSCULAR | Status: AC
  Filled 2019-01-13: qty 2

## 2019-01-13 MED ORDER — SILVER NITRATE-POT NITRATE 75-25 % EX MISC
CUTANEOUS | Status: AC
  Filled 2019-01-13: qty 1

## 2019-01-13 MED ORDER — OXYCODONE-ACETAMINOPHEN 5-300 MG PO TABS: 1.0000 | ORAL_TABLET | ORAL | 0 refills | Status: DC | PRN

## 2019-01-13 MED ORDER — MIDAZOLAM HCL 2 MG/2ML IJ SOLN
INTRAMUSCULAR | Status: DC | PRN
  Administered 2019-01-13: 2 mg via INTRAVENOUS

## 2019-01-13 MED ORDER — ROCURONIUM BROMIDE 100 MG/10ML IV SOLN
INTRAVENOUS | Status: DC | PRN
  Administered 2019-01-13: 30 mg via INTRAVENOUS

## 2019-01-13 MED ORDER — MIDAZOLAM HCL 2 MG/2ML IJ SOLN
INTRAMUSCULAR | Status: AC
  Filled 2019-01-13: qty 2

## 2019-01-13 MED ORDER — OXYCODONE HCL 5 MG PO TABS: 5.0000 mg | ORAL_TABLET | Freq: Once | ORAL | Status: DC | PRN

## 2019-01-13 MED ORDER — LIDOCAINE HCL (CARDIAC) PF 100 MG/5ML IV SOSY
PREFILLED_SYRINGE | INTRAVENOUS | Status: DC | PRN
  Administered 2019-01-13: 50 mg via INTRAVENOUS

## 2019-01-13 MED ORDER — ONDANSETRON HCL 4 MG/2ML IJ SOLN
INTRAMUSCULAR | Status: DC | PRN
  Administered 2019-01-13: 4 mg via INTRAVENOUS

## 2019-01-13 MED ORDER — LIDOCAINE HCL URETHRAL/MUCOSAL 2 % EX GEL: 1.0000 "application " | CUTANEOUS | 2 refills | Status: DC | PRN

## 2019-01-13 MED ORDER — PROPOFOL 10 MG/ML IV BOLUS
INTRAVENOUS | Status: DC | PRN
  Administered 2019-01-13: 150 mg via INTRAVENOUS
  Administered 2019-01-13: 20 mg via INTRAVENOUS

## 2019-01-13 MED ORDER — LACTATED RINGERS IV SOLN
INTRAVENOUS | Status: DC
  Administered 2019-01-13 (×3): via INTRAVENOUS

## 2019-01-13 MED ORDER — FENTANYL CITRATE (PF) 100 MCG/2ML IJ SOLN
INTRAMUSCULAR | Status: DC | PRN
  Administered 2019-01-13: 100 ug via INTRAVENOUS

## 2019-01-13 MED ORDER — OXYCODONE-ACETAMINOPHEN 5-325 MG PO TABS: 1.0000 | ORAL_TABLET | ORAL | 0 refills | Status: DC | PRN

## 2019-01-13 MED ORDER — GABAPENTIN 300 MG PO CAPS
300.0000 mg | ORAL_CAPSULE | Freq: Once | ORAL | Status: AC
  Administered 2019-01-13: 300 mg via ORAL

## 2019-01-13 MED ORDER — SUGAMMADEX SODIUM 200 MG/2ML IV SOLN
INTRAVENOUS | Status: AC
  Filled 2019-01-13: qty 2

## 2019-01-13 MED ORDER — OXYCODONE HCL 5 MG/5ML PO SOLN: 5.0000 mg | Freq: Once | ORAL | Status: DC | PRN

## 2019-01-13 MED ORDER — DEXAMETHASONE SODIUM PHOSPHATE 10 MG/ML IJ SOLN
INTRAMUSCULAR | Status: DC | PRN
  Administered 2019-01-13: 5 mg via INTRAVENOUS

## 2019-01-13 MED ORDER — ONDANSETRON HCL 4 MG/2ML IJ SOLN: 4.0000 mg | Freq: Once | INTRAMUSCULAR | Status: DC | PRN

## 2019-01-13 MED ORDER — LIDOCAINE 2% (20 MG/ML) 5 ML SYRINGE
INTRAMUSCULAR | Status: AC
  Filled 2019-01-13: qty 5

## 2019-01-13 MED ORDER — SCOPOLAMINE 1 MG/3DAYS TD PT72: 1.0000 | MEDICATED_PATCH | Freq: Once | TRANSDERMAL | Status: DC | PRN

## 2019-01-13 MED ORDER — GABAPENTIN 300 MG PO CAPS
ORAL_CAPSULE | ORAL | Status: AC
  Filled 2019-01-13: qty 1

## 2019-01-13 MED ORDER — KETAMINE HCL 100 MG/ML IJ SOLN
INTRAMUSCULAR | Status: DC | PRN
  Administered 2019-01-13: 25 mg via INTRAVENOUS

## 2019-01-13 MED ORDER — ACETAMINOPHEN 160 MG/5ML PO SOLN: 325.0000 mg | ORAL | Status: DC | PRN

## 2019-01-13 MED ORDER — ACETAMINOPHEN 500 MG PO TABS
ORAL_TABLET | ORAL | Status: AC
  Filled 2019-01-13: qty 2

## 2019-01-13 MED ORDER — FENTANYL CITRATE (PF) 100 MCG/2ML IJ SOLN: 50.0000 ug | INTRAMUSCULAR | Status: DC | PRN

## 2019-01-13 MED ORDER — CELECOXIB 200 MG PO CAPS
ORAL_CAPSULE | ORAL | Status: AC
  Filled 2019-01-13: qty 1

## 2019-01-13 MED ORDER — ALPRAZOLAM 0.25 MG PO TABS
0.2500 mg | ORAL_TABLET | Freq: Once | ORAL | Status: AC
  Administered 2019-01-13: 13:00:00 0.25 mg via ORAL
  Filled 2019-01-13: qty 1

## 2019-01-13 SURGICAL SUPPLY — 50 items
ADH SKN CLS APL DERMABOND .7 (GAUZE/BANDAGES/DRESSINGS) ×2
BAG SPEC RTRVL LRG 6X4 10 (ENDOMECHANICALS)
BIPOLAR CUTTING LOOP 21FR (ELECTRODE)
BRIEF STRETCH FOR OB PAD XXL (UNDERPADS AND DIAPERS) ×3 IMPLANT
CABLE HIGH FREQUENCY MONO STRZ (ELECTRODE) IMPLANT
CANISTER SUCT 3000ML PPV (MISCELLANEOUS) ×3 IMPLANT
DERMABOND ADVANCED (GAUZE/BANDAGES/DRESSINGS) ×1
DERMABOND ADVANCED .7 DNX12 (GAUZE/BANDAGES/DRESSINGS) IMPLANT
DILATOR CANAL MILEX (MISCELLANEOUS) IMPLANT
DRSG OPSITE POSTOP 3X4 (GAUZE/BANDAGES/DRESSINGS) IMPLANT
DURAPREP 26ML APPLICATOR (WOUND CARE) ×3 IMPLANT
ELECT REM PT RETURN 9FT ADLT (ELECTROSURGICAL) ×3
ELECTRODE REM PT RTRN 9FT ADLT (ELECTROSURGICAL) IMPLANT
GAUZE 4X4 16PLY RFD (DISPOSABLE) ×1 IMPLANT
GLOVE BIO SURGEON STRL SZ 6.5 (GLOVE) ×3 IMPLANT
GLOVE BIOGEL PI IND STRL 7.0 (GLOVE) ×4 IMPLANT
GLOVE BIOGEL PI INDICATOR 7.0 (GLOVE) ×2
GOWN STRL REUS W/ TWL XL LVL3 (GOWN DISPOSABLE) ×2 IMPLANT
GOWN STRL REUS W/TWL LRG LVL3 (GOWN DISPOSABLE) ×3 IMPLANT
GOWN STRL REUS W/TWL XL LVL3 (GOWN DISPOSABLE) ×3
HANDPIECE ABLA MINERVA ENDO (MISCELLANEOUS) ×1 IMPLANT
IV NS IRRIG 3000ML ARTHROMATIC (IV SOLUTION) ×1 IMPLANT
KIT PROCEDURE FLUENT (KITS) ×2 IMPLANT
LOOP CUTTING BIPOLAR 21FR (ELECTRODE) IMPLANT
NDL SPNL 18GX3.5 QUINCKE PK (NEEDLE) IMPLANT
NEEDLE INSUFFLATION 120MM (ENDOMECHANICALS) ×3 IMPLANT
NEEDLE SPNL 18GX3.5 QUINCKE PK (NEEDLE) IMPLANT
NS IRRIG 1000ML POUR BTL (IV SOLUTION) ×2 IMPLANT
PACK LAPAROSCOPY BASIN (CUSTOM PROCEDURE TRAY) ×3 IMPLANT
PACK TRENDGUARD 450 HYBRID PRO (MISCELLANEOUS) IMPLANT
PACK TRENDGUARD 600 HYBRD PROC (MISCELLANEOUS) IMPLANT
PACK VAGINAL MINOR WOMEN LF (CUSTOM PROCEDURE TRAY) ×3 IMPLANT
PAD OB MATERNITY 4.3X12.25 (PERSONAL CARE ITEMS) ×3 IMPLANT
PAD PREP 24X48 CUFFED NSTRL (MISCELLANEOUS) ×3 IMPLANT
PENCIL BUTTON HOLSTER BLD 10FT (ELECTRODE) ×1 IMPLANT
POUCH SPECIMEN RETRIEVAL 10MM (ENDOMECHANICALS) IMPLANT
SEAL ROD LENS SCOPE MYOSURE (ABLATOR) IMPLANT
SET IRRIG TUBING LAPAROSCOPIC (IRRIGATION / IRRIGATOR) ×1 IMPLANT
SET TUBE SMOKE EVAC HIGH FLOW (TUBING) ×3 IMPLANT
SHEARS HARMONIC ACE PLUS 36CM (ENDOMECHANICALS) ×1 IMPLANT
SLEEVE SCD COMPRESS KNEE MED (MISCELLANEOUS) ×3 IMPLANT
SLEEVE XCEL OPT CAN 5 100 (ENDOMECHANICALS) ×2 IMPLANT
SUT VICRYL 0 UR6 27IN ABS (SUTURE) IMPLANT
SUT VICRYL 4-0 PS2 18IN ABS (SUTURE) IMPLANT
TOWEL GREEN STERILE FF (TOWEL DISPOSABLE) ×6 IMPLANT
TRENDGUARD 450 HYBRID PRO PACK (MISCELLANEOUS) ×3
TRENDGUARD 600 HYBRID PROC PK (MISCELLANEOUS)
TROCAR OPTI TIP 5M 100M (ENDOMECHANICALS) ×3 IMPLANT
TROCAR XCEL NON-BLD 11X100MML (ENDOMECHANICALS) IMPLANT
WARMER LAPAROSCOPE (MISCELLANEOUS) ×3 IMPLANT

## 2019-01-13 NOTE — Anesthesia Preprocedure Evaluation (Addendum)
Anesthesia Evaluation  Patient identified by MRN, date of birth, ID band Patient awake    Reviewed: Allergy & Precautions, NPO status , Patient's Chart, lab work & pertinent test results  History of Anesthesia Complications (+) history of anesthetic complications (whole body swelling with anesthesia?)  Airway Mallampati: I  TM Distance: >3 FB Neck ROM: Full    Dental no notable dental hx. (+) Teeth Intact   Pulmonary neg sleep apnea, neg COPD, Current Smoker,    Pulmonary exam normal breath sounds clear to auscultation       Cardiovascular (-) hypertension(-) Past MI and (-) CHF Normal cardiovascular exam(-) dysrhythmias (-) Valvular Problems/Murmurs Rhythm:Regular Rate:Normal     Neuro/Psych neg Seizures PSYCHIATRIC DISORDERS Anxiety Depression    GI/Hepatic Neg liver ROS, neg GERD  ,  Endo/Other  neg diabetes  Renal/GU negative Renal ROS     Musculoskeletal   Abdominal   Peds  Hematology  (+) Blood dyscrasia, anemia ,   Anesthesia Other Findings   Reproductive/Obstetrics                            Anesthesia Physical  Anesthesia Plan  ASA: II  Anesthesia Plan: General   Post-op Pain Management:    Induction: Intravenous  PONV Risk Score and Plan: 3 and Ondansetron, Dexamethasone, Treatment may vary due to age or medical condition and Midazolam  Airway Management Planned: Oral ETT and LMA  Additional Equipment:   Intra-op Plan:   Post-operative Plan: Extubation in OR  Informed Consent: I have reviewed the patients History and Physical, chart, labs and discussed the procedure including the risks, benefits and alternatives for the proposed anesthesia with the patient or authorized representative who has indicated his/her understanding and acceptance.       Plan Discussed with: CRNA, Anesthesiologist and Surgeon  Anesthesia Plan Comments: (  )        Anesthesia  Quick Evaluation

## 2019-01-13 NOTE — Discharge Instructions (Signed)
Dilation and Curettage or Vacuum Curettage, Care After This sheet gives you information about how to care for yourself after your procedure. Your health care provider may also give you more specific instructions. If you have problems or questions, contact your health care provider. What can I expect after the procedure? After your procedure, it is common to have:  Mild pain or cramping.  Some vaginal bleeding or spotting. These may last for up to 2 weeks after your procedure. Follow these instructions at home: Activity   Do not drive or use heavy machinery while taking prescription pain medicine.  Avoid driving for the first 24 hours after your procedure.  Take frequent, short walks, followed by rest periods, throughout the day. Ask your health care provider what activities are safe for you. After 1-2 days, you may be able to return to your normal activities.  Do not lift anything heavier than 10 lb (4.5 kg) until your health care provider approves.  For at least 2 weeks, or as long as told by your health care provider, do not: ? Douche. ? Use tampons. ? Have sexual intercourse. General instructions   Take over-the-counter and prescription medicines only as told by your health care provider. This is especially important if you take blood thinning medicine.  Do not take baths, swim, or use a hot tub until your health care provider approves. Take showers instead of baths.  Wear compression stockings as told by your health care provider. These stockings help to prevent blood clots and reduce swelling in your legs.  It is your responsibility to get the results of your procedure. Ask your health care provider, or the department performing the procedure, when your results will be ready.  Keep all follow-up visits as told by your health care provider. This is important. Contact a health care provider if:  You have severe cramps that get worse or that do not get better with  medicine.  You have severe abdominal pain.  You cannot drink fluids without vomiting.  You develop pain in a different area of your pelvis.  You have bad-smelling vaginal discharge.  You have a rash. Get help right away if:  You have vaginal bleeding that soaks more than one sanitary pad in 1 hour, for 2 hours in a row.  You pass large blood clots from your vagina.  You have a fever that is above 100.29F (38.0C).  Your abdomen feels very tender or hard.  You have chest pain.  You have shortness of breath.  You cough up blood.  You feel dizzy or light-headed.  You faint.  You have pain in your neck or shoulder area. This information is not intended to replace advice given to you by your health care provider. Make sure you discuss any questions you have with your health care provider. Document Released: 07/19/2000 Document Revised: 03/20/2016 Document Reviewed: 02/22/2016 Elsevier Interactive Patient Education  2019 [employer] Diagnostic Laparoscopy, Care After This sheet gives you information about how to care for yourself after your procedure. Your health care provider may also give you more specific instructions. If you have problems or questions, contact your health care provider. What can I expect after the procedure? After the procedure, it is common to have:  Mild discomfort in the abdomen.  Sore throat. Women who have laparoscopy with pelvic examination may have mild cramping and fluid coming from the vagina for a few days after the procedure. Follow these instructions at home: Medicines  Take over-the-counter and prescription  medicines only as told by your health care provider.  If you were prescribed an antibiotic medicine, take it as told by your health care provider. Do not stop taking the antibiotic even if you start to feel better. Driving  Do not drive for 24 hours if you were given a medicine to help you relax (sedative) during your  procedure.  Do not drive or use heavy machinery while taking prescription pain medicine. Bathing  Do not take baths, swim, or use a hot tub until your health care provider approves. You may take showers. Incision care   Follow instructions from your health care provider about how to take care of your incisions. Make sure you: ? Wash your hands with soap and water before you change your bandage (dressing). If soap and water are not available, use hand sanitizer. ? Change your dressing as told by your health care provider. ? Leave stitches (sutures), skin glue, or adhesive strips in place. These skin closures may need to stay in place for 2 weeks or longer. If adhesive strip edges start to loosen and curl up, you may trim the loose edges. Do not remove adhesive strips completely unless your health care provider tells you to do that.  Check your incision areas every day for signs of infection. Check for: ? Redness, swelling, or pain. ? Fluid or blood. ? Warmth. ? Pus or a bad smell. Activity  Return to your normal activities as told by your health care provider. Ask your health care provider what activities are safe for you.  Do not lift anything that is heavier than 10 lb (4.5 kg), or the limit that you are told, until your health care provider says that it is safe. General instructions  To prevent or treat constipation while you are taking prescription pain medicine, your health care provider may recommend that you: ? Drink enough fluid to keep your urine pale yellow. ? Take over-the-counter or prescription medicines. ? Eat foods that are high in fiber, such as fresh fruits and vegetables, whole grains, and beans. ? Limit foods that are high in fat and processed sugars, such as fried and sweet foods.  Do not use any products that contain nicotine or tobacco, such as cigarettes and e-cigarettes. If you need help quitting, ask your health care provider.  Keep all follow-up visits as  told by your health care provider. This is important. Contact a health care provider if:  You develop shoulder pain.  You feel lightheaded or faint.  You are unable to pass gas or have a bowel movement.  You feel nauseous or you vomit.  You develop a rash.  You have redness, swelling, or pain around any incision.  You have fluid or blood coming from any incision.  Any incision feels warm to the touch.  You have pus or a bad smell coming from any incision.  You have a fever or chills. Get help right away if:  You have severe pain.  You have vomiting that does not go away.  You have heavy bleeding from the vagina.  Any incision opens.  You have trouble breathing.  You have chest pain. Summary  After the procedure, it is common to have mild discomfort in the abdomen and a sore throat.  Check your incision areas every day for signs of infection.  Return to your normal activities as told by your health care provider. Ask your health care provider what activities are safe for you. This information is not intended  to replace advice given to you by your health care provider. Make sure you discuss any questions you have with your health care provider. Document Released: 07/03/2015 Document Revised: 01/15/2017 Document Reviewed: 01/15/2017 Elsevier Interactive Patient Education  2019 [employer]     Post Anesthesia Home Care Instructions  Activity: Get plenty of rest for the remainder of the day. A responsible individual must stay with you for 24 hours following the procedure.  For the next 24 hours, DO NOT: -Drive a car -Advertising copywriterperate machinery -Drink alcoholic beverages -Take any medication unless instructed by your physician -Make any legal decisions or sign important papers.  Meals: Start with liquid foods such as gelatin or soup. Progress to regular foods as tolerated. Avoid greasy, spicy, heavy foods. If nausea and/or vomiting occur, drink only clear liquids  until the nausea and/or vomiting subsides. Call your physician if vomiting continues.  Special Instructions/Symptoms: Your throat may feel dry or sore from the anesthesia or the breathing tube placed in your throat during surgery. If this causes discomfort, gargle with warm salt water. The discomfort should disappear within 24 hours.  If you had a scopolamine patch placed behind your ear for the management of post- operative nausea and/or vomiting:  1. The medication in the patch is effective for 72 hours, after which it should be removed.  Wrap patch in a tissue and discard in the trash. Wash hands thoroughly with soap and water. 2. You may remove the patch earlier than 72 hours if you experience unpleasant side effects which may include dry mouth, dizziness or visual disturbances. 3. Avoid touching the patch. Wash your hands with soap and water after contact with the patch.     Post Anesthesia Home Care Instructions  Activity: Get plenty of rest for the remainder of the day. A responsible individual must stay with you for 24 hours following the procedure.  For the next 24 hours, DO NOT: -Drive a car -Advertising copywriterperate machinery -Drink alcoholic beverages -Take any medication unless instructed by your physician -Make any legal decisions or sign important papers.  Meals: Start with liquid foods such as gelatin or soup. Progress to regular foods as tolerated. Avoid greasy, spicy, heavy foods. If nausea and/or vomiting occur, drink only clear liquids until the nausea and/or vomiting subsides. Call your physician if vomiting continues.  Special Instructions/Symptoms: Your throat may feel dry or sore from the anesthesia or the breathing tube placed in your throat during surgery. If this causes discomfort, gargle with warm salt water. The discomfort should disappear within 24 hours.  If you had a scopolamine patch placed behind your ear for the management of post- operative nausea and/or  vomiting:  1. The medication in the patch is effective for 72 hours, after which it should be removed.  Wrap patch in a tissue and discard in the trash. Wash hands thoroughly with soap and water. 2. You may remove the patch earlier than 72 hours if you experience unpleasant side effects which may include dry mouth, dizziness or visual disturbances. 3. Avoid touching the patch. Wash your hands with soap and water after contact with the patch.

## 2019-01-13 NOTE — Anesthesia Postprocedure Evaluation (Signed)
Anesthesia Post Note  Patient: [name]  Procedure(s) Performed: LAPAROSCOPY DIAGNOSTIC, Lysis of Adhesions, Biopsy Posterior Cul de Sac (N/A Abdomen) DILATATION AND CURETTAGE WITH MINERVA ABLATION (N/A Vagina ) EXCISION OF Labial Warts (N/A Vagina )     Anesthesia Type: General    Last Vitals:  Vitals:   01/13/19 1230 01/13/19 1310  BP: 124/69 127/80  Pulse: (!) 51 68  Resp: (!) 22 18  Temp:  36.8 C  SpO2: 100% 100%    Last Pain:  Vitals:   01/13/19 1230  TempSrc:   PainSc: 10-Worst pain ever   Extremely hard to manage postoperatively secondary to N/V, tremor, agitation and pain. I feel that the underlying issue was anxiety and preconditioning.              Norinne Jeane

## 2019-01-13 NOTE — Transfer of Care (Signed)
Immediate Anesthesia Transfer of Care Note  Patient: [name]  Procedure(s) Performed: LAPAROSCOPY DIAGNOSTIC, Lysis of Adhesions, Biopsy Posterior Cul de Sac (N/A Abdomen) DILATATION AND CURETTAGE WITH MINERVA ABLATION (N/A Vagina ) EXCISION OF Labial Warts (N/A Vagina )  Patient Location: PACU  Anesthesia Type:General  Level of Consciousness: awake, alert , oriented, drowsy and patient cooperative  Airway & Oxygen Therapy: Patient Spontanous Breathing and Patient connected to face mask oxygen  Post-op Assessment: Report given to RN and Post -op Vital signs reviewed and stable  Post vital signs: Reviewed and stable  Last Vitals:  Vitals Value Taken Time  BP 124/72 01/13/2019 11:30 AM  Temp    Pulse 75 01/13/2019 11:31 AM  Resp 23 01/13/2019 11:31 AM  SpO2 100 % 01/13/2019 11:31 AM  Vitals shown include unvalidated device data.  Last Pain:  Vitals:   01/13/19 0849  TempSrc: Oral  PainSc: 4       Patients Stated Pain Goal: 4 (64/33/29 5188)  Complications: No apparent anesthesia complications

## 2019-01-13 NOTE — H&P (Signed)
[name] is an 33 y.o. single P4 (7512, 3611, 6910 and 323 yo kids) here a diagnostic laparoscopy, d&c, Minerva ablation, removal of genital warts. She had a left labial cyst in the past, but it has resolved on its own.   She reports her periods are irregular and very painful. She also has the pelvic pain without her periods, worse with her periods. This has been present for "many, many years". She takes IBU and naproxyn with some little bit of help. She has had a BTL.    Menstrual History: Menarche age: [age] Patient's last menstrual period was 12/29/2018 (exact date).    Past Medical History:  Diagnosis Date  . Anemia   . Anxiety 11/21/2011  . Blood transfusion complicating pregnancy   . Cervical lymphadenopathy 04/29/2017  . Complication of anesthesia    Swelling from some drug used for anesthesia. Does not  know name.  . Depression   . Genital warts   . History of methicillin resistant staphylococcus aureus (MRSA) 2017  . HPV test positive   . RhD negative     Past Surgical History:  Procedure Laterality Date  . CESAREAN SECTION N/A 07/03/2015   Procedure: CESAREAN SECTION;  Surgeon: Levie HeritageJacob J Stinson, DO;  Location: [facility];  Service: Obstetrics;  Laterality: N/A;  . DILATION AND CURETTAGE OF UTERUS    . FRACTURE SURGERY Right    pinky finger  . fractured finger  2001   open reduction  . THERAPEUTIC ABORTION    . TUBAL LIGATION    . VENTRAL HERNIA REPAIR N/A 11/03/2017   Procedure: HERNIA REPAIR VENTRAL ADULT;  Surgeon: Henrene DodgePiscoya, Jose, MD;  Location: [facility];  Service: General;  Laterality: N/A;    Family History  Problem Relation Age of Onset  . Colon cancer Mother   . Heart disease Father   . Hyperlipidemia Father   . Kidney disease Father   . Alcohol abuse Father     Social History:  reports that she has been smoking cigarettes. She has a 2.00 pack-year smoking history. She has never used smokeless tobacco. She reports current drug use. Drug: Marijuana. She reports  that she does not drink alcohol.  Allergies:  Allergies  Allergen Reactions  . Other Anaphylaxis    Childhood reaction, finger surgery, total body swelling to anesthesia  medication  . Latex Itching and Swelling    Medications Prior to Admission  Medication Sig Dispense Refill Last Dose  . ALPRAZolam (XANAX) 0.25 MG tablet Take 0.5 mg by mouth at bedtime as needed for anxiety.    Past Week at Unknown time  . busPIRone (BUSPAR) 10 MG tablet Take 10 mg by mouth 3 (three) times daily.   01/12/2019 at Unknown time  . naproxen (NAPROSYN) 250 MG tablet Take by mouth 2 (two) times daily with a meal.   Past Week at Unknown time  . Melatonin 10 MG TABS Take 10-40 mg by mouth at bedtime as needed (for sleep.).   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   Blood pressure (!) 100/55, pulse 94, temperature 98.4 F (36.9 C), temperature source Oral, resp. rate 18, height 4\' 11"  (1.499 m), weight 53 kg, last menstrual period 12/29/2018, SpO2 100 %. Physical Exam  Breathing, conversing, and ambulating normally Heart- rrr Lungs- CTAB Abd- benign  No results found for this or any previous visit (from the past 24 hour(s)).  No results found.  Assessment/Plan: Chronic pelvic pain-  sounds like endometriosis. I will plan for a diagnostic laparoscopy. She is aware that there is a 67% chance that I will find an etiology of her pain. DUB- plan for d&c and Minerva ablation Genital warts- failed treatment with cryo, aldara, and TCA-  I will excise them. She is aware that they may return. She had a long admission after a hernia repair for pain management. I will pretreat with ERAS drugs to try to prevent post op pain issues.   She understands the risks of surgery, including, but not to infection, bleeding, DVTs, damage to bowel, bladder, ureters. She wishes to proceed.     [name] 01/13/2019, 9:23 AM

## 2019-01-13 NOTE — Op Note (Addendum)
01/13/2019  11:18 AM  PATIENT:  [name]  33 y.o. female  PRE-OPERATIVE DIAGNOSIS:  DUB Chronic Pelvic Pain Genital Warts  POST-OPERATIVE DIAGNOSIS: same, dense pelvic adhesions  PROCEDURE:  Procedure(s) with comments: LAPAROSCOPY DIAGNOSTIC, Lysis of Adhesions, Biopsy Posterior Cul de Sac (N/A) DILATATION AND CURETTAGE WITH MINERVA ABLATION (N/A) - Rep will be here confirmed 01/08/2019 CS EXCISION OF Labial Warts (N/A)  SURGEON:  Surgeon(s) and Role:    * Catlin Aycock C, MD - Primary   ANESTHESIA:   local and general  EBL:  20 mL   BLOOD ADMINISTERED:none  DRAINS: none   LOCAL MEDICATIONS USED:  MARCAINE     SPECIMEN:  Source of Specimen:  biopsy of cul de sac, endometrial curettings, vulvar warts  DISPOSITION OF SPECIMEN:  PATHOLOGY  COUNTS:  YES  TOURNIQUET:  * No tourniquets in log *  DICTATION: .Dragon Dictation  PLAN OF CARE: Discharge to home after PACU  PATIENT DISPOSITION:  PACU - hemodynamically stable.   Delay start of Pharmacological VTE agent (>24hrs) due to surgical blood loss or risk of bleeding: not applicable   The risks, benefits, alternatives of surgery were explained understood, accepted. All questions were answered. Her urine pregnancy test was negative. She was taken to the operating room and general anesthesia was applied without complication. She was placed in dorsal lithotomy position. Her abdomen and vagina were prepped and draped in the usual sterile fashion. A time out procedure was done. A bimanual exam revealed a normal, size and shape mobile uterus with nonenlarged adnexa. A Hulka manipulator was placed on the cervix. Gloves were changed and attention was turned to the abdomen. Approximately 10 mL of 0.5% Marcaine was used to infiltrate the subcutaneous tissue in the left upper quadrant. An OG tube was placed. A vertical 5 mm incision was made. A Veres needle was placed in the pelvis. Low-flow CO2 was used to insufflate the abdomen  to approximately 3 L. After good pneumoperitoneum was established, a 5 mm trocar was placed. Laparoscopy confirmed correct placement. She was placed in the Trendelenburg position. Patient abdominal pressure was always less than 15. There was a large amount of dense omental adhesions to the umbilicus (She had a hernia repair). The uterus was densely adherent to the anterior abdominal wall from midway fundus and lower. Her  ovaries and tubes (had Filsche clips present) appeared normal. I placed a 5 mm port in the left and right mid abdomen under direct laparoscopic visualization. I then used the harmonic scalpel to carefully take down all the omental adhesions. I then removed approximately half of the dense uterine adhesions to the anterior abdominal wall. I stopped when the adhesions became so dense that I could not assuredly avoid damage to the bladder. Hemostasis was noted. I then very carefully inspected the pelvis, not finding any obvious endometriosis. There were a few tiny reddish areas in the cul de sac and I biopsied one. It was not very suspicious for endometriosis, but her history surely suggests this diagnosis. The CO2 was allowed to escape from her abdomen. The skin closure was done with dermabond. suture. The Hulka manipulator was removed.  I then proceeded with the vaginal portion of the case.   A speculum was placed and a single-tooth tenaculum was used to grasp the anterior lip of her cervix. A total of 30 mL of 0.5% Marcaine was used to perform a paracervical block. Her uterus sounded to 10 cm and her cervical length was 5 cm. Her cervix  was carefully and slowly dilated to accommodate a small curette. A curettage was done in all quadrants and the fundus of the uterus. A small amount of polypoid-type  tissue was obtained.  I then proceeded with the Minerva procedure. The procedure proceeded as expected. There was no bleeding noted at the end of the case. I then elevated each of her 5 vaginal warts  and excised them. I used silver nitrate on each of the small bases to achieve hemostasis. There was one that was large enough that I had to close the defect with 4-0 vicryl suture. She was taken to the recovery room after being extubated. She tolerated the procedure well. The instrument, sponge, and needle counts were correct.

## 2019-01-13 NOTE — Anesthesia Procedure Notes (Signed)
Procedure Name: Intubation Date/Time: 01/13/2019 10:07 AM Performed by: Raenette Rover, CRNA Pre-anesthesia Checklist: Patient identified, Emergency Drugs available, Suction available and Patient being monitored Patient Re-evaluated:Patient Re-evaluated prior to induction Oxygen Delivery Method: Circle system utilized Preoxygenation: Pre-oxygenation with 100% oxygen Induction Type: IV induction and Rapid sequence Laryngoscope Size: Mac and 3 Grade View: Grade I Tube type: Oral Tube size: 7.0 mm Number of attempts: 1 Airway Equipment and Method: Stylet Placement Confirmation: ETT inserted through vocal cords under direct vision,  positive ETCO2,  breath sounds checked- equal and bilateral and CO2 detector Secured at: 22 cm Tube secured with: Tape Dental Injury: Teeth and Oropharynx as per pre-operative assessment

## 2019-01-14 ENCOUNTER — Encounter (HOSPITAL_BASED_OUTPATIENT_CLINIC_OR_DEPARTMENT_OTHER): Payer: Self-pay | Admitting: Obstetrics & Gynecology

## 2019-01-14 ENCOUNTER — Telehealth: Payer: Self-pay | Admitting: *Deleted

## 2019-01-14 NOTE — Telephone Encounter (Signed)
Pt given letter to return to work 01/21/19 per VO Dr Hulan Fray

## 2019-01-14 NOTE — Telephone Encounter (Signed)
I returned her call about her post op pain. I reminded her that we had a conversation pre op about her low pain tolerance. She was admitted for a week after a hernia repair for pain tolerance. We established prior to this surgery that admission for pain management was not an option. The pain she is describing is from the CO2 from the laparoscopy. I have rec'd walking and a heating pad to her abdomen.

## 2019-01-14 NOTE — Progress Notes (Signed)
Patient stated she was sent home with no paperwork, she stated she had no information, did not know who was with her or who brought her home. When she was told that her boyfriend took her home she stated she was not speaking to him right now and did not have any information on her discharge from him. I explained her discharge information to her and told her I would mail a copy to her address. She stated understanding. I gave her Dr. Alease Medina phone number in order to get questions answered that I could not about pain management and concerns about her incisions after surgery. AVS given to [facility] at desk to mail to patient.

## 2019-02-11 ENCOUNTER — Encounter: Payer: Medicaid Other | Admitting: Obstetrics & Gynecology

## 2019-04-13 ENCOUNTER — Telehealth: Payer: Self-pay | Admitting: *Deleted

## 2019-04-13 NOTE — Telephone Encounter (Signed)
!!!  Please schedule a Nurse or CPP appointment for vaccine!!!

## 2019-05-25 ENCOUNTER — Telehealth: Payer: Self-pay | Admitting: Internal Medicine

## 2019-05-25 DIAGNOSIS — F419 Anxiety disorder, unspecified: Secondary | ICD-10-CM

## 2019-05-25 NOTE — Telephone Encounter (Signed)
Will forward to pcp

## 2019-05-25 NOTE — Telephone Encounter (Signed)
Patient called requesting to be referred to a psychiatrist. States she is feeling a lot of anxiety and panic and has not been feeling like herself. Please follow up

## 2019-06-02 ENCOUNTER — Ambulatory Visit: Payer: Medicaid Other | Admitting: Obstetrics & Gynecology

## 2019-08-24 DIAGNOSIS — F319 Bipolar disorder, unspecified: Secondary | ICD-10-CM | POA: Insufficient documentation

## 2019-08-24 DIAGNOSIS — F909 Attention-deficit hyperactivity disorder, unspecified type: Secondary | ICD-10-CM | POA: Insufficient documentation

## 2019-08-24 DIAGNOSIS — F17210 Nicotine dependence, cigarettes, uncomplicated: Secondary | ICD-10-CM | POA: Insufficient documentation

## 2019-08-24 DIAGNOSIS — F411 Generalized anxiety disorder: Secondary | ICD-10-CM | POA: Insufficient documentation

## 2020-01-17 ENCOUNTER — Ambulatory Visit: Payer: Medicaid Other | Admitting: Obstetrics and Gynecology

## 2020-03-02 ENCOUNTER — Telehealth: Payer: Self-pay | Admitting: Internal Medicine

## 2020-03-02 NOTE — Telephone Encounter (Signed)
Patient was called to be informed of the change made for her appointment on 03/21/2020. Patient was scheduled in error for the time of 2:40pm. Appointment has been changed to the same day at 4pm. If patient returns the call please inform her of appointment time change.

## 2020-03-20 ENCOUNTER — Ambulatory Visit: Payer: Medicaid Other | Admitting: Obstetrics and Gynecology

## 2020-03-21 ENCOUNTER — Ambulatory Visit: Payer: Medicaid Other | Admitting: Family

## 2020-03-21 ENCOUNTER — Ambulatory Visit (HOSPITAL_BASED_OUTPATIENT_CLINIC_OR_DEPARTMENT_OTHER): Payer: Medicaid Other | Admitting: Family

## 2020-03-21 DIAGNOSIS — Z5329 Procedure and treatment not carried out because of patient's decision for other reasons: Secondary | ICD-10-CM

## 2020-03-21 NOTE — Progress Notes (Signed)
Patient did not show for appointment.   

## 2020-10-19 ENCOUNTER — Ambulatory Visit: Payer: Medicaid Other | Admitting: Physician Assistant

## 2020-10-19 ENCOUNTER — Other Ambulatory Visit: Payer: Self-pay

## 2020-10-19 VITALS — BP 110/74 | HR 111 | Temp 98.2°F | Resp 18 | Ht 62.0 in

## 2020-10-19 DIAGNOSIS — R000 Tachycardia, unspecified: Secondary | ICD-10-CM

## 2020-10-19 DIAGNOSIS — F17200 Nicotine dependence, unspecified, uncomplicated: Secondary | ICD-10-CM

## 2020-10-19 DIAGNOSIS — F411 Generalized anxiety disorder: Secondary | ICD-10-CM | POA: Diagnosis not present

## 2020-10-19 DIAGNOSIS — Z862 Personal history of diseases of the blood and blood-forming organs and certain disorders involving the immune mechanism: Secondary | ICD-10-CM

## 2020-10-19 DIAGNOSIS — R5383 Other fatigue: Secondary | ICD-10-CM

## 2020-10-19 DIAGNOSIS — R319 Hematuria, unspecified: Secondary | ICD-10-CM | POA: Diagnosis not present

## 2020-10-19 DIAGNOSIS — F4321 Adjustment disorder with depressed mood: Secondary | ICD-10-CM

## 2020-10-19 DIAGNOSIS — F319 Bipolar disorder, unspecified: Secondary | ICD-10-CM

## 2020-10-19 DIAGNOSIS — L299 Pruritus, unspecified: Secondary | ICD-10-CM

## 2020-10-19 DIAGNOSIS — F5104 Psychophysiologic insomnia: Secondary | ICD-10-CM

## 2020-10-19 DIAGNOSIS — R600 Localized edema: Secondary | ICD-10-CM

## 2020-10-19 LAB — POCT URINALYSIS DIP (CLINITEK)
Glucose, UA: NEGATIVE mg/dL
Leukocytes, UA: NEGATIVE
Nitrite, UA: NEGATIVE
Spec Grav, UA: >=1.03 — AB (ref 1.010–1.025)
Urobilinogen, UA: 0.2 E.U./dL
pH, UA: 6 (ref 5.0–8.0)

## 2020-10-19 LAB — POCT URINE PREGNANCY: Preg Test, Ur: NEGATIVE

## 2020-10-19 MED ORDER — CARIPRAZINE HCL 1.5 MG PO CAPS: 1.5000 mg | ORAL_CAPSULE | Freq: Every day | ORAL | 1 refills | Status: DC

## 2020-10-19 MED ORDER — BUSPIRONE HCL 10 MG PO TABS: 10.0000 mg | ORAL_TABLET | Freq: Two times a day (BID) | ORAL | 1 refills | Status: DC

## 2020-10-19 MED ORDER — HYDROXYZINE HCL 10 MG PO TABS: 10.0000 mg | ORAL_TABLET | Freq: Three times a day (TID) | ORAL | 0 refills | Status: DC | PRN

## 2020-10-19 NOTE — Progress Notes (Signed)
Established Patient Office Visit  Subjective:  Patient ID: BURLENE MONTECALVO, female    DOB: [birth year]  Age: 35 y.o. MRN: [id number]  CC: No chief complaint on file.   HPI KHADEJA ABT presents with several complaints.  Reports that she has been having increased depression and anxiety, difficulty sleeping, bilateral lower back pain, increased urination, feet swelling.  Reports that she has been treated for bipolar disorder, attention deficit, depression and anxiety.  Reports that she was taking BuSpar, Vraylar, Strattera.  Reports that she was last seen at [facility] in June 2021.  Reports that she has not taking these medications for several months.  Reports that she feels that she is withdrawing, feels sluggish most of the time.  Adamantly denies any thoughts of self-harm.  Reports that her father passed from cirrhosis June 2021.  Reports that she has been dealing with grief from his death.  Reports that she has been having difficulty sleeping despite taking melatonin.  Reports that she will only sleep a few hours a night.  Reports that she has been having increased urination, and bilateral lower back pain as well as some suprapubic discomfort for the past 2 months.  Reports that she does not drink very much water, has seen some blood in her urine.   Reports that she has been having swelling in her feet and ankles on an almost every day basis, mostly at night.  Reports that last night she felt that her legs were numb from her knees down.  Reports this has been going on for the past 2 months as well  Also in the past 2 months, states that she has felt itchy all over.  Reports that she will scratch and see small bumps come up.  Has not tried anything for relief, states that she feels very itchy part is on the inside.  Denies any new detergents, lotions, body washes, fragrances, medications.        Past Medical History:  Diagnosis Date  . Anemia   . Anxiety 11/21/2011  . Blood  transfusion complicating pregnancy   . Cervical lymphadenopathy 04/29/2017  . Complication of anesthesia    Swelling from some drug used for anesthesia. Does not  know name.  . Depression   . Genital warts   . History of methicillin resistant staphylococcus aureus (MRSA) 2017  . HPV test positive   . RhD negative     Past Surgical History:  Procedure Laterality Date  . CESAREAN SECTION N/A 07/03/2015   Procedure: CESAREAN SECTION;  Surgeon: Levie Heritage, DO;  Location: [facility];  Service: Obstetrics;  Laterality: N/A;  . DILATION AND CURETTAGE OF UTERUS    . FRACTURE SURGERY Right    pinky finger  . fractured finger  2001   open reduction  . HYSTEROSCOPY WITH D & C N/A 01/13/2019   Procedure: DILATATION AND CURETTAGE WITH MINERVA ABLATION;  Surgeon: Allie Bossier, MD;  Location: [facility];  Service: Gynecology;  Laterality: N/A;  Rep will be here confirmed 01/08/2019 CS  . LAPAROSCOPY N/A 01/13/2019   Procedure: LAPAROSCOPY DIAGNOSTIC, Lysis of Adhesions, Biopsy Posterior Cul de Sac;  Surgeon: Allie Bossier, MD;  Location: [facility];  Service: Gynecology;  Laterality: N/A;  . LESION REMOVAL N/A 01/13/2019   Procedure: EXCISION OF Labial Warts;  Surgeon: Allie Bossier, MD;  Location: [facility];  Service: Gynecology;  Laterality: N/A;  . THERAPEUTIC ABORTION    . TUBAL LIGATION    .  VENTRAL HERNIA REPAIR N/A 11/03/2017   Procedure: HERNIA REPAIR VENTRAL ADULT;  Surgeon: Henrene Dodge, MD;  Location: [facility];  Service: General;  Laterality: N/A;    Family History  Problem Relation Age of Onset  . Colon cancer Mother   . Heart disease Father   . Hyperlipidemia Father   . Kidney disease Father   . Alcohol abuse Father     Social History   Socioeconomic History  . Marital status: Single    Spouse name: Not on file  . Number of children: Not on file  . Years of education: Not on file  . Highest education level: Not on file   Occupational History  . Not on file  Tobacco Use  . Smoking status: Current Every Day Smoker    Packs/day: 0.25    Years: 8.00    Pack years: 2.00    Types: Cigarettes  . Smokeless tobacco: Never Used  Vaping Use  . Vaping Use: Some days  Substance and Sexual Activity  . Alcohol use: No  . Drug use: Yes    Types: Marijuana    Comment: QD  . Sexual activity: Not on file  Other Topics Concern  . Not on file  Social History Narrative   ** Merged History Encounter **       Social Determinants of Health   Financial Resource Strain: Not on file  Food Insecurity: Not on file  Transportation Needs: Not on file  Physical Activity: Not on file  Stress: Not on file  Social Connections: Not on file  Intimate Partner Violence: Not on file    Outpatient Medications Prior to Visit  Medication Sig Dispense Refill  . ALPRAZolam (XANAX) 0.25 MG tablet Take 0.5 mg by mouth at bedtime as needed for anxiety.     Marland Kitchen ibuprofen (ADVIL) 800 MG tablet Take 1 tablet (800 mg total) by mouth every 8 (eight) hours as needed. 30 tablet 0  . busPIRone (BUSPAR) 10 MG tablet Take 10 mg by mouth 3 (three) times daily.    . Melatonin 10 MG TABS Take 10-40 mg by mouth at bedtime as needed (for sleep.).    Marland Kitchen atomoxetine (STRATTERA) 18 MG capsule Take by mouth.    . lidocaine (XYLOCAINE) 2 % jelly Apply 1 application topically as needed. (Patient not taking: Reported on 10/19/2020) 30 mL 2  . oxycodone-acetaminophen (LYNOX) 5-300 MG tablet Take 1 tablet by mouth every 4 (four) hours as needed for pain. (Patient not taking: Reported on 10/19/2020) 30 tablet 0  . oxyCODONE-acetaminophen (PERCOCET/ROXICET) 5-325 MG tablet Take 1 tablet by mouth every 4 (four) hours as needed. (Patient not taking: Reported on 10/19/2020) 30 tablet 0  . busPIRone (BUSPAR) 10 MG tablet Take by mouth.    . cariprazine (VRAYLAR) capsule Take by mouth.     No facility-administered medications prior to visit.    Allergies  Allergen  Reactions  . Other Anaphylaxis    Childhood reaction, finger surgery, total body swelling to anesthesia  medication  . Latex Itching and Swelling    ROS Review of Systems  Constitutional: Positive for fatigue. Negative for chills and fever.  HENT: Negative.   Eyes: Negative.   Respiratory: Negative for shortness of breath.   Cardiovascular: Positive for leg swelling. Negative for chest pain.  Gastrointestinal: Negative for constipation, nausea and vomiting.  Endocrine: Negative.   Genitourinary: Positive for frequency and hematuria. Negative for dysuria and vaginal discharge.  Musculoskeletal: Positive for back pain.  Skin: Negative for  color change.  Allergic/Immunologic: Negative.   Neurological: Negative.   Hematological: Negative.   Psychiatric/Behavioral: Positive for dysphoric mood and sleep disturbance. Negative for self-injury and suicidal ideas. The patient is nervous/anxious.       Objective:    Physical Exam Vitals and nursing note reviewed.  Constitutional:      Appearance: Normal appearance.  HENT:     Head: Normocephalic and atraumatic.     Right Ear: External ear normal.     Left Ear: External ear normal.     Nose: Nose normal.     Mouth/Throat:     Mouth: Mucous membranes are dry.     Pharynx: Oropharynx is clear.  Eyes:     Extraocular Movements: Extraocular movements intact.     Conjunctiva/sclera: Conjunctivae normal.     Pupils: Pupils are equal, round, and reactive to light.  Cardiovascular:     Rate and Rhythm: Normal rate and regular rhythm.     Pulses: Normal pulses.     Heart sounds: Normal heart sounds.  Pulmonary:     Effort: Pulmonary effort is normal.     Breath sounds: Normal breath sounds.  Abdominal:     General: Abdomen is flat. Bowel sounds are normal.     Tenderness: There is abdominal tenderness in the suprapubic area. There is right CVA tenderness and left CVA tenderness.  Musculoskeletal:        General: Normal range of  motion.     Cervical back: Normal range of motion and neck supple.     Right lower leg: No edema.     Left lower leg: No edema.  Skin:    General: Skin is warm and dry.  Neurological:     General: No focal deficit present.     Mental Status: She is alert and oriented to person, place, and time.  Psychiatric:        Mood and Affect: Mood normal.        Behavior: Behavior normal.        Thought Content: Thought content normal.        Judgment: Judgment normal.     BP 110/74 (BP Location: Left Arm, Patient Position: Sitting, Cuff Size: Normal)   Pulse (!) 111   Temp 98.2 F (36.8 C) (Oral)   Resp 18   Ht  (1.575 m)   SpO2 99%   BMI 21.37 kg/m  Wt Readings from Last 3 Encounters:  01/13/19 116 lb 13.5 oz (53 kg)  12/17/18 118 lb (53.5 kg)  09/10/18 116 lb (52.6 kg)     Health Maintenance Due  Topic Date Due  . COVID-19 Vaccine (1) Never done  . INFLUENZA VACCINE  03/05/2020    There are no preventive care reminders to display for this patient.  Lab Results  Component Value Date   TSH 1.12 09/10/2018   Lab Results  Component Value Date   WBC 8.6 09/10/2018   HGB 13.2 09/10/2018   HCT 39.3 09/10/2018   MCV 89.3 09/10/2018   PLT 359 09/10/2018   Lab Results  Component Value Date   NA 141 08/07/2018   K 4.3 08/07/2018   CO2 24 08/07/2018   GLUCOSE 70 08/07/2018   BUN 13 08/07/2018   CREATININE 0.61 08/07/2018   BILITOT <0.2 08/07/2018   ALKPHOS 58 08/07/2018   AST 13 08/07/2018   ALT 8 08/07/2018   PROT 6.7 08/07/2018   ALBUMIN 4.8 08/07/2018   CALCIUM 9.7 08/07/2018   ANIONGAP 11  12/11/2017   No results found for: CHOL No results found for: HDL No results found for: LDLCALC No results found for: TRIG No results found for: CHOLHDL No results found for: SAYT0Z    Assessment & Plan:   Problem List Items Addressed This Visit      Musculoskeletal and Integument   Pruritus     Other   Tobacco dependence (Chronic)   Bipolar 1 disorder  (HCC)   Relevant Medications   busPIRone (BUSPAR) 10 MG tablet   cariprazine (VRAYLAR) capsule   Other Relevant Orders   Ambulatory referral to Psychiatry   Lipid panel   Vitamin D, 25-hydroxy   GAD (generalized anxiety disorder)   Relevant Medications   busPIRone (BUSPAR) 10 MG tablet   hydrOXYzine (ATARAX/VISTARIL) 10 MG tablet   Localized edema   Relevant Orders   CBC with Differential/Platelet   Comp. Metabolic Panel (12)   Magnesium   Psychophysiological insomnia   Grief   Other fatigue   Relevant Orders   TSH   Iron, TIBC and Ferritin Panel   Tachycardia    Other Visit Diagnoses    Hematuria, unspecified type    -  Primary   Relevant Orders   POCT urine pregnancy (Completed)   POCT URINALYSIS DIP (CLINITEK) (Completed)   Urine Culture   History of anemia        1. Hematuria, unspecified type UA negative for leukocytes or nitrates.  Pregnancy test negative  Encourage patient to increase hydration, Tylenol or ibuprofen over-the-counter - POCT urine pregnancy - POCT URINALYSIS DIP (CLINITEK) - Urine Culture  2. Bipolar 1 disorder (HCC) Encouraged patient to resume medications, patient has been off medications for several months, will restart at lower dosing.  Patient to return to mobile unit in 2 weeks for further evaluation.  Refer to psychiatry for medication management CBT - Ambulatory referral to Psychiatry - busPIRone (BUSPAR) 10 MG tablet; Take 1 tablet (10 mg total) by mouth 2 (two) times daily.  Dispense: 60 tablet; Refill: 1 - cariprazine (VRAYLAR) capsule; Take 1 capsule (1.5 mg total) by mouth daily.  Dispense: 30 capsule; Refill: 1 - Lipid panel - Vitamin D, 25-hydroxy  3. GAD (generalized anxiety disorder) Trial hydroxyzine - hydrOXYzine (ATARAX/VISTARIL) 10 MG tablet; Take 1 tablet (10 mg total) by mouth 3 (three) times daily as needed.  Dispense: 30 tablet; Refill: 0  4. Tobacco dependence   5. Localized edema Encourage patient to increase  hydration, keep feet elevated, lower sodium - CBC with Differential/Platelet - Comp. Metabolic Panel (12) - Magnesium  6. Pruritus Trial hydroxyzine  7. Psychophysiological insomnia Trial hydroxyzine  8. Grief Refer to psychiatry for CBT, and grief counseling  9. Other fatigue  - TSH - Iron, TIBC and Ferritin Panel  10. History of anemia Patient reported history of anemia, unable to determine from the  11. Tachycardia Encouraged patient to increase hydration   I have reviewed the patient's medical history (PMH, PSH, Social History, Family History, Medications, and allergies) , and have been updated if relevant. I spent 30 minutes reviewing chart and  face to face time with patient.      Meds ordered this encounter  Medications  . busPIRone (BUSPAR) 10 MG tablet    Sig: Take 1 tablet (10 mg total) by mouth 2 (two) times daily.    Dispense:  60 tablet    Refill:  1    Order Specific Question:   Supervising Provider    Answer:   Storm Frisk [  [id number]]  . cariprazine (VRAYLAR) capsule    Sig: Take 1 capsule (1.5 mg total) by mouth daily.    Dispense:  30 capsule    Refill:  1    Order Specific Question:   Supervising Provider    Answer:   WRIGHT, PATRICK E [[id number]]  . hydrOXYzine (ATARAX/VISTARIL) 10 MG tablet    Sig: Take 1 tablet (10 mg total) by mouth 3 (three) times daily as needed.    Dispense:  30 tablet    Refill:  0    Order Specific Question:   Supervising Provider    Answer:   Storm FriskWRIGHT, PATRICK E [[id number]]    Follow-up: Return in about 2 weeks (around 11/02/2020).    Kasandra Knudsenari S Mayers, PA-C

## 2020-10-19 NOTE — Progress Notes (Signed)
Patient complains of bilateral back pain. Patient complains of urinary frequency for the past two months. Patient has not eaten today. Patient had not taken medication.. Patient currently scales back pain at a 4. Pain is described as aching. Melatonin is not helping and patient stopped anxiety medications 3 months ago.  Patient complains of feet swelling at night and knee down feeling numb which last occurred last night. Patient reports total body rash for the past two months with itching sensation. Patient denies any new scents, detergents.

## 2020-10-19 NOTE — Patient Instructions (Signed)
I encourage you to resume your Vraylar and your BuSpar.  You will take 1.5 mg of Vraylar and you will take BuSpar twice a day.  I have started a referral for you to be seen by psychiatry for medication management.  For your itching, you will use hydroxyzine 10 mg every 8 hours as needed.  This medication can also help you with your anxiety, as well as insomnia.  For insomnia, I encourage you to try 20 to 30 mg of hydroxyzine before bed.  For your low back pain and hematuria, I encourage you to increase your water intake, we will call you with the results of the urine culture.  You can use Tylenol or ibuprofen over-the-counter to help you with the pain.  For your feet swelling and below the knee discomfort, I encourage you again to increase your hydration, keep your feet elevated when able.  We will call you with your lab results.  I encourage you to return to the mobile unit in 2 weeks for follow-up.  Please let us know if there is anything else we can do for you  Roney Jaffe, PA-C Physician Assistant [facility] [email or web address]    Managing Loss, Adult People experience loss in many different ways throughout their lives. Events such as moving, changing jobs, and losing friends can create a sense of loss. The loss may be as serious as a major health change, divorce, death of a pet, or death of a loved one. All of these types of loss are likely to create a physical and emotional reaction known as grief. Grief is the result of a major change or an absence of something or someone that you count on. Grief is a normal reaction to loss. A variety of factors can affect your grieving experience, including:  The nature of your loss.  Your relationship to what or whom you lost.  Your understanding of grief and how to manage it.  Your support system. How to manage lifestyle changes Keep to your normal routine as much as possible.  If  you have trouble focusing or doing normal activities, it is acceptable to take some time away from your normal routine.  Spend time with friends and loved ones.  Eat a healthy diet, get plenty of sleep, and rest when you feel tired.   How to recognize changes  The way that you deal with your grief will affect your ability to function as you normally do. When grieving, you may experience these changes:  Numbness, shock, sadness, anxiety, anger, denial, and guilt.  Thoughts about death.  Unexpected crying.  A physical sensation of emptiness in your stomach.  Problems sleeping and eating.  Tiredness (fatigue).  Loss of interest in normal activities.  Dreaming about or imagining seeing the person who died.  A need to remember what or whom you lost.  Difficulty thinking about anything other than your loss for a period of time.  Relief. If you have been expecting the loss for a while, you may feel a sense of relief when it happens. Follow these instructions at home: Activity Express your feelings in healthy ways, such as:  Talking with others about your loss. It may be helpful to find others who have had a similar loss, such as a support group.  Writing down your feelings in a journal.  Doing physical activities to release stress and emotional energy.  Doing creative activities like painting, sculpting, or playing or listening to music.  Practicing resilience.  This is the ability to recover and adjust after facing challenges. Reading some resources that encourage resilience may help you to learn ways to practice those behaviors.   General instructions  Be patient with yourself and others. Allow the grieving process to happen, and remember that grieving takes time. ? It is likely that you may never feel completely done with some grief. You may find a way to move on while still cherishing memories and feelings about your loss. ? Accepting your loss is a process. It can take  months or longer to adjust.  Keep all follow-up visits as told by your health care provider. This is important. Where to find support To get support for managing loss:  Ask your health care provider for help and recommendations, such as grief counseling or therapy.  Think about joining a support group for people who are managing a loss. Where to find more information You can find more information about managing loss from:  American Society of Clinical Oncology: www.cancer.net  American Psychological Association: [employer] Contact a health care provider if:  Your grief is extreme and keeps getting worse.  You have ongoing grief that does not improve.  Your body shows symptoms of grief, such as illness.  You feel depressed, anxious, or lonely. Get help right away if:  You have thoughts about hurting yourself or others. If you ever feel like you may hurt yourself or others, or have thoughts about taking your own life, get help right away. You can go to your nearest emergency department or call:  Your local emergency services (911 in the [place]  A suicide crisis helpline, such as the National Suicide Prevention Lifeline at 989-715-5689. This is open 24 hours a day. Summary  Grief is the result of a major change or an absence of someone or something that you count on. Grief is a normal reaction to loss.  The depth of grief and the period of recovery depend on the type of loss and your ability to adjust to the change and process your feelings.  Processing grief requires patience and a willingness to accept your feelings and talk about your loss with people who are supportive.  It is important to find resources that work for you and to realize that people experience grief differently. There is not one grieving process that works for everyone in the same way.  Be aware that when grief becomes extreme, it can lead to more severe issues like isolation, depression, anxiety, or  suicidal thoughts. Talk with your health care provider if you have any of these issues. This information is not intended to replace advice given to you by your health care provider. Make sure you discuss any questions you have with your health care provider. Document Revised: 01/13/2020 Document Reviewed: 01/13/2020 Elsevier Patient Education  2021 [employer]   Edema  Edema is an abnormal buildup of fluids in the body tissues and under the skin. Swelling of the legs, feet, and ankles is a common symptom that becomes more likely as you get older. Swelling is also common in looser tissues, like around the eyes. When the affected area is squeezed, the fluid may move out of that spot and leave a dent for a few moments. This dent is called pitting edema. There are many possible causes of edema. Eating too much salt (sodium) and being on your feet or sitting for a long time can cause edema in your legs, feet, and ankles. Hot weather may make edema worse.  Common causes of edema include:  Heart failure.  Liver or kidney disease.  Weak leg blood vessels.  Cancer.  An injury.  Pregnancy.  Medicines.  Being obese.  Low protein levels in the blood. Edema is usually painless. Your skin may look swollen or shiny. Follow these instructions at home:  Keep the affected body part raised (elevated) above the level of your heart when you are sitting or lying down.  Do not sit still or stand for long periods of time.  Do not wear tight clothing. Do not wear garters on your upper legs.  Exercise your legs to get your circulation going. This helps to move the fluid back into your blood vessels, and it may help the swelling go down.  Wear elastic bandages or support stockings to reduce swelling as told by your health care provider.  Eat a low-salt (low-sodium) diet to reduce fluid as told by your health care provider.  Depending on the cause of your swelling, you may need to limit how much  fluid you drink (fluid restriction).  Take over-the-counter and prescription medicines only as told by your health care provider. Contact a health care provider if:  Your edema does not get better with treatment.  You have heart, liver, or kidney disease and have symptoms of edema.  You have sudden and unexplained weight gain. Get help right away if:  You develop shortness of breath or chest pain.  You cannot breathe when you lie down.  You develop pain, redness, or warmth in the swollen areas.  You have heart, liver, or kidney disease and suddenly get edema.  You have a fever and your symptoms suddenly get worse. Summary  Edema is an abnormal buildup of fluids in the body tissues and under the skin.  Eating too much salt (sodium) and being on your feet or sitting for a long time can cause edema in your legs, feet, and ankles.  Keep the affected body part raised (elevated) above the level of your heart when you are sitting or lying down. This information is not intended to replace advice given to you by your health care provider. Make sure you discuss any questions you have with your health care provider. Document Revised: 12/09/2018 Document Reviewed: 08/24/2016 Elsevier Patient Education  2021 [employer].

## 2020-10-20 LAB — IRON,TIBC AND FERRITIN PANEL
Ferritin: 43 ng/mL (ref 15–150)
Iron Saturation: 13 % — ABNORMAL LOW (ref 15–55)
Iron: 51 ug/dL (ref 27–159)
Total Iron Binding Capacity: 394 ug/dL (ref 250–450)
UIBC: 343 ug/dL (ref 131–425)

## 2020-10-20 LAB — CBC WITH DIFFERENTIAL/PLATELET
Basophils Absolute: 0.1 10*3/uL (ref 0.0–0.2)
Basos: 1 %
EOS (ABSOLUTE): 0.2 10*3/uL (ref 0.0–0.4)
Eos: 2 %
Hematocrit: 44.2 % (ref 34.0–46.6)
Hemoglobin: 14.3 g/dL (ref 11.1–15.9)
Immature Grans (Abs): 0 10*3/uL (ref 0.0–0.1)
Immature Granulocytes: 0 %
Lymphocytes Absolute: 2.7 10*3/uL (ref 0.7–3.1)
Lymphs: 22 %
MCH: 29.7 pg (ref 26.6–33.0)
MCHC: 32.4 g/dL (ref 31.5–35.7)
MCV: 92 fL (ref 79–97)
Monocytes Absolute: 0.7 10*3/uL (ref 0.1–0.9)
Monocytes: 5 %
Neutrophils Absolute: 8.6 10*3/uL — ABNORMAL HIGH (ref 1.4–7.0)
Neutrophils: 70 %
Platelets: 458 10*3/uL — ABNORMAL HIGH (ref 150–450)
RBC: 4.81 x10E6/uL (ref 3.77–5.28)
RDW: 12.4 % (ref 11.7–15.4)
WBC: 12.3 10*3/uL — ABNORMAL HIGH (ref 3.4–10.8)

## 2020-10-20 LAB — COMP. METABOLIC PANEL (12)
AST: 13 IU/L (ref 0–40)
Albumin/Globulin Ratio: 2 (ref 1.2–2.2)
Albumin: 4.7 g/dL (ref 3.8–4.8)
Alkaline Phosphatase: 79 IU/L (ref 44–121)
BUN/Creatinine Ratio: 15 (ref 9–23)
BUN: 9 mg/dL (ref 6–20)
Bilirubin Total: 0.2 mg/dL (ref 0.0–1.2)
Calcium: 9.7 mg/dL (ref 8.7–10.2)
Chloride: 100 mmol/L (ref 96–106)
Creatinine, Ser: 0.59 mg/dL (ref 0.57–1.00)
Globulin, Total: 2.4 g/dL (ref 1.5–4.5)
Glucose: 97 mg/dL (ref 65–99)
Potassium: 4.1 mmol/L (ref 3.5–5.2)
Sodium: 138 mmol/L (ref 134–144)
Total Protein: 7.1 g/dL (ref 6.0–8.5)
eGFR: 123 mL/min/{1.73_m2} (ref >59)

## 2020-10-20 LAB — LIPID PANEL
Chol/HDL Ratio: 3.4 ratio (ref 0.0–4.4)
Cholesterol, Total: 153 mg/dL (ref 100–199)
HDL: 45 mg/dL (ref >39)
LDL Chol Calc (NIH): 95 mg/dL (ref 0–99)
Triglycerides: 67 mg/dL (ref 0–149)
VLDL Cholesterol Cal: 13 mg/dL (ref 5–40)

## 2020-10-20 LAB — MAGNESIUM: Magnesium: 2.1 mg/dL (ref 1.6–2.3)

## 2020-10-20 LAB — VITAMIN D 25 HYDROXY (VIT D DEFICIENCY, FRACTURES): Vit D, 25-Hydroxy: 30.7 ng/mL (ref 30.0–100.0)

## 2020-10-20 LAB — TSH: TSH: 1.11 u[IU]/mL (ref 0.450–4.500)

## 2020-10-21 LAB — URINE CULTURE: Organism ID, Bacteria: NO GROWTH

## 2020-10-24 ENCOUNTER — Telehealth: Payer: Self-pay | Admitting: *Deleted

## 2020-10-24 NOTE — Telephone Encounter (Signed)
-----   Message from Roney Jaffe, [place] sent at 10/24/2020  8:44 AM EDT ----- Please call patient and let her know that her urine culture did not show any type of urinary tract infection.  Her magnesium level, vitamin D level and thyroid levels are within normal limits.  Her cholesterol is well controlled.  Her kidney function and liver function are within normal limits.  She does have a slightly elevated white blood cell count and neutrophil count, this may be due to seasonal allergies.  She has a slightly below normal level of iron saturation, however she does not show any signs of iron deficiency anemia.

## 2020-10-24 NOTE — Telephone Encounter (Signed)
Medical Assistant left message on patient's home and cell voicemail. Voicemail states to give a call back to [place] with [facility] [phone]. Patient is aware of labs showing normal kidney, liver and thyroid function. Patient is aware of culture showing no infection and magnesium, vitamin d and cholesterol all being normal. Patient is aware of iron deficiency not being present even with slightly below normal saturation limits.

## 2020-11-23 ENCOUNTER — Other Ambulatory Visit: Payer: Self-pay

## 2020-11-23 ENCOUNTER — Ambulatory Visit: Payer: Medicaid Other | Attending: Physician Assistant | Admitting: Physician Assistant

## 2021-01-02 ENCOUNTER — Emergency Department (HOSPITAL_BASED_OUTPATIENT_CLINIC_OR_DEPARTMENT_OTHER)
Admission: EM | Admit: 2021-01-02 | Discharge: 2021-01-02 | Disposition: A | Discharge status: Home / Self Care | Payer: Medicaid Other | Attending: Emergency Medicine | Admitting: Emergency Medicine

## 2021-01-02 ENCOUNTER — Other Ambulatory Visit: Payer: Self-pay

## 2021-01-02 ENCOUNTER — Encounter (HOSPITAL_BASED_OUTPATIENT_CLINIC_OR_DEPARTMENT_OTHER): Payer: Self-pay

## 2021-01-02 DIAGNOSIS — R0602 Shortness of breath: Secondary | ICD-10-CM | POA: Diagnosis not present

## 2021-01-02 DIAGNOSIS — M545 Low back pain, unspecified: Secondary | ICD-10-CM | POA: Insufficient documentation

## 2021-01-02 DIAGNOSIS — R319 Hematuria, unspecified: Secondary | ICD-10-CM | POA: Insufficient documentation

## 2021-01-02 DIAGNOSIS — R1032 Left lower quadrant pain: Secondary | ICD-10-CM | POA: Insufficient documentation

## 2021-01-02 DIAGNOSIS — R202 Paresthesia of skin: Secondary | ICD-10-CM | POA: Diagnosis not present

## 2021-01-02 DIAGNOSIS — R109 Unspecified abdominal pain: Secondary | ICD-10-CM

## 2021-01-02 DIAGNOSIS — Z91040 Latex allergy status: Secondary | ICD-10-CM | POA: Diagnosis not present

## 2021-01-02 DIAGNOSIS — R519 Headache, unspecified: Secondary | ICD-10-CM | POA: Diagnosis not present

## 2021-01-02 DIAGNOSIS — M7989 Other specified soft tissue disorders: Secondary | ICD-10-CM | POA: Diagnosis not present

## 2021-01-02 DIAGNOSIS — F17210 Nicotine dependence, cigarettes, uncomplicated: Secondary | ICD-10-CM | POA: Diagnosis not present

## 2021-01-02 DIAGNOSIS — R1031 Right lower quadrant pain: Secondary | ICD-10-CM | POA: Diagnosis not present

## 2021-01-02 DIAGNOSIS — Z79899 Other long term (current) drug therapy: Secondary | ICD-10-CM | POA: Diagnosis not present

## 2021-01-02 LAB — CBC WITH DIFFERENTIAL/PLATELET
Abs Immature Granulocytes: 0.04 10*3/uL (ref 0.00–0.07)
Basophils Absolute: 0.1 10*3/uL (ref 0.0–0.1)
Basophils Relative: 1 %
Eosinophils Absolute: 0.7 10*3/uL — ABNORMAL HIGH (ref 0.0–0.5)
Eosinophils Relative: 5 %
HCT: 41.3 % (ref 36.0–46.0)
Hemoglobin: 13.8 g/dL (ref 12.0–15.0)
Immature Granulocytes: 0 %
Lymphocytes Relative: 23 %
Lymphs Abs: 3.4 10*3/uL (ref 0.7–4.0)
MCH: 29.8 pg (ref 26.0–34.0)
MCHC: 33.4 g/dL (ref 30.0–36.0)
MCV: 89.2 fL (ref 80.0–100.0)
Monocytes Absolute: 1 10*3/uL (ref 0.1–1.0)
Monocytes Relative: 7 %
Neutro Abs: 9.5 10*3/uL — ABNORMAL HIGH (ref 1.7–7.7)
Neutrophils Relative %: 64 %
Platelets: 540 10*3/uL — ABNORMAL HIGH (ref 150–400)
RBC: 4.63 MIL/uL (ref 3.87–5.11)
RDW: 13.6 % (ref 11.5–15.5)
WBC: 14.8 10*3/uL — ABNORMAL HIGH (ref 4.0–10.5)
nRBC: 0 % (ref 0.0–0.2)

## 2021-01-02 LAB — URINALYSIS, ROUTINE W REFLEX MICROSCOPIC
Bilirubin Urine: NEGATIVE
Glucose, UA: NEGATIVE mg/dL
Ketones, ur: NEGATIVE mg/dL
Leukocytes,Ua: NEGATIVE
Nitrite: NEGATIVE
Protein, ur: NEGATIVE mg/dL
Specific Gravity, Urine: 1.02 (ref 1.005–1.030)
pH: 6.5 (ref 5.0–8.0)

## 2021-01-02 LAB — COMPREHENSIVE METABOLIC PANEL
ALT: 13 U/L (ref 0–44)
AST: 15 U/L (ref 15–41)
Albumin: 4.1 g/dL (ref 3.5–5.0)
Alkaline Phosphatase: 69 U/L (ref 38–126)
Anion gap: 10 (ref 5–15)
BUN: 9 mg/dL (ref 6–20)
CO2: 25 mmol/L (ref 22–32)
Calcium: 8.9 mg/dL (ref 8.9–10.3)
Chloride: 102 mmol/L (ref 98–111)
Creatinine, Ser: 0.6 mg/dL (ref 0.44–1.00)
GFR, Estimated: >60 mL/min (ref >60)
Glucose, Bld: 87 mg/dL (ref 70–99)
Potassium: 3.6 mmol/L (ref 3.5–5.1)
Sodium: 137 mmol/L (ref 135–145)
Total Bilirubin: 0.2 mg/dL — ABNORMAL LOW (ref 0.3–1.2)
Total Protein: 7.8 g/dL (ref 6.5–8.1)

## 2021-01-02 LAB — URINALYSIS, MICROSCOPIC (REFLEX): WBC, UA: NONE SEEN WBC/hpf (ref 0–5)

## 2021-01-02 LAB — LIPASE, BLOOD: Lipase: 32 U/L (ref 11–51)

## 2021-01-02 LAB — PREGNANCY, URINE: Preg Test, Ur: NEGATIVE

## 2021-01-02 NOTE — ED Triage Notes (Signed)
Multiple complaints. Pt states has had hematuria x months. Vaginal bleeding x1. States she has numbness in legs that go up her body and "make her feel like shes going to die". C/o SOB, flank pain, abdominal pain, sweating, fatigue... Hx anxiety.
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
## 2021-01-02 NOTE — Discharge Instructions (Addendum)
You came to the emergency department today to be evaluated for your multiple complaints.  Your physical exam was reassuring.  Your urinalysis showed no signs of infection or blood in your urine.  You had no electrolyte abnormalities.  Due to the longstanding nature of your leg swelling and numbness I have given you information to follow-up with a neurologist Dr. Jerrell Belfast.  Please call their office tomorrow morning to schedule an appointment.  Please follow-up with the [facility].  Call their office tomorrow morning to schedule an appointment.  Please read attached paperwork.  Get help right away if you: Feel muscle weakness. Develop new weakness in an arm or leg. Have trouble walking or moving. Have problems with speech, understanding, or vision. Feel confused. Cannot control your bladder or bowel movements.

## 2021-01-03 ENCOUNTER — Ambulatory Visit: Payer: Medicaid Other | Attending: Physician Assistant | Admitting: Physician Assistant

## 2021-01-03 ENCOUNTER — Encounter: Payer: Self-pay | Admitting: Physician Assistant

## 2021-01-03 DIAGNOSIS — Z09 Encounter for follow-up examination after completed treatment for conditions other than malignant neoplasm: Secondary | ICD-10-CM | POA: Diagnosis not present

## 2021-01-03 DIAGNOSIS — F319 Bipolar disorder, unspecified: Secondary | ICD-10-CM | POA: Diagnosis not present

## 2021-01-03 DIAGNOSIS — F411 Generalized anxiety disorder: Secondary | ICD-10-CM | POA: Diagnosis not present

## 2021-01-03 DIAGNOSIS — F419 Anxiety disorder, unspecified: Secondary | ICD-10-CM

## 2021-01-03 DIAGNOSIS — R519 Headache, unspecified: Secondary | ICD-10-CM | POA: Diagnosis not present

## 2021-01-03 MED ORDER — IBUPROFEN 800 MG PO TABS: 800.0000 mg | ORAL_TABLET | Freq: Three times a day (TID) | ORAL | 0 refills | Status: DC | PRN

## 2021-01-03 MED ORDER — BUSPIRONE HCL 10 MG PO TABS: 10.0000 mg | ORAL_TABLET | Freq: Two times a day (BID) | ORAL | 1 refills | Status: DC

## 2021-01-03 MED ORDER — HYDROXYZINE HCL 10 MG PO TABS: 10.0000 mg | ORAL_TABLET | Freq: Three times a day (TID) | ORAL | 0 refills | Status: DC | PRN

## 2021-01-03 NOTE — Progress Notes (Signed)
Patient ID: [name], female   DOB: January 15, 1986, 35 y.o.   MRN: [id number]   Virtual Visit via Telephone Note  I connected with [name] on 01/03/21 at  3:30 PM EDT by telephone and verified that I am speaking with the correct person using two identifiers.  Location: Patient: home Provider: [facility] office   I discussed the limitations, risks, security and privacy concerns of performing an evaluation and management service by telephone and the availability of in person appointments. I also discussed with the patient that there may be a patient responsible charge related to this service. The patient expressed understanding and agreed to proceed.   History of Present Illness: Seen in the ED yesterday for multiple issues.  Frequent HAs.  Fatigue.  Naseous.  This has all been going on for about 6-8 months.  Out of all of her meds for a few days.  Hasn't scheduled an appt with [facility] in a while.  All labs thus far since about March essentially normal.   After ED visit 01/02/2021:  Alert 35 year old female no acute distress, nontoxic-appearing.  Patient presents with multiple chief complaints.  Patient identifies that her main concern is paresthesias and swelling to bilateral lower legs.  Patient reports that the symptoms have been present intermittently since January.  Patient has had no change in her symptoms.  On physical exam patient has no focal neurological deficits.  Sensation to light touch is intact to bilateral upper and lower extremities.  CMP is unremarkable, paresthesias or not attributed to electrolyte abnormality.  Patient does not require CVA or TIA work-up at this time due to prolonged nature of her symptoms.  We will have patient follow-up with neurologist in outpatient setting.  Patient also endorses lower abdominal pain intermittently over the last month.  On physical exam abdomen soft, nondistended, tenderness to bilateral lower quadrants.  Lipase within normal limits,  low suspicion for acute pancreatitis.  AST, ALT, alk phos, total bili all within normal limits; low suspicion for hepatobiliary disease.  Urinalysis shows no signs of infection low suspicion for urinary tract infection.  Low suspicion for acute pancreatitis due to prolonged timeframe of symptoms.  Patient has no hematuria low suspicion for renal calculus at this time.  Pregnancy test negative.  Patient lungs clear to auscultation bilaterally.  Patient speaks in full sentences without difficulty.  Oxygen saturation 97% or greater on room air.  Low suspicion for pneumonia.  Low suspicion for subarachnoid hemorrhage as pain is not worse with exertion, onset was gradual, pain not maximal in onset.  Patient has had improvement with her headaches with ibuprofen.  Patient to follow-up with neurology.  Will give patient information to follow-up with [facility] center.  Patient has been seen by this clinic previously.  Discussed results, findings, treatment and follow up. Patient advised of return precautions    Observations/Objective:  NAD.  Seems anxious.  A&Ox3   Assessment and Plan: 1. Nonintractable headache, unspecified chronicity pattern, unspecified headache type To ED or call 911 if changes or severs - Ambulatory referral to Neurology - ibuprofen (ADVIL) 800 MG tablet; Take 1 tablet (800 mg total) by mouth every 8 (eight) hours as needed. Prn pain/headache  Dispense: 60 tablet; Refill: 0  2. Bipolar 1 disorder ([place]) -call [facility] for follow-up - busPIRone (BUSPAR) 10 MG tablet; Take 1 tablet (10 mg total) by mouth 2 (two) times daily.  Dispense: 60 tablet; Refill: 1  3. Encounter for examination following treatment at hospital  4. GAD (generalized anxiety disorder) - hydrOXYzine (ATARAX/VISTARIL) 10 MG tablet; Take 1 tablet (10 mg total) by mouth 3 (three) times daily as needed. Prn anxiety  Dispense: 60 tablet; Refill: 0  5. Anxiety Self-care recommended.  Proper  nutrition and exercise as well.   Follow Up Instructions: See PCP in about 6-8 weeks   I discussed the assessment and treatment plan with the patient. The patient was provided an opportunity to ask questions and all were answered. The patient agreed with the plan and demonstrated an understanding of the instructions.   The patient was advised to call back or seek an in-person evaluation if the symptoms worsen or if the condition fails to improve as anticipated.  I provided 17 minutes of non-face-to-face time during this encounter.   Freeman Caldron, PA-C

## 2021-01-24 ENCOUNTER — Other Ambulatory Visit: Payer: Self-pay | Admitting: Internal Medicine

## 2021-01-24 ENCOUNTER — Other Ambulatory Visit: Payer: Self-pay | Admitting: Physician Assistant

## 2021-01-24 DIAGNOSIS — R519 Headache, unspecified: Secondary | ICD-10-CM

## 2021-01-24 DIAGNOSIS — F411 Generalized anxiety disorder: Secondary | ICD-10-CM

## 2021-01-24 MED ORDER — IBUPROFEN 800 MG PO TABS: 800.0000 mg | ORAL_TABLET | Freq: Three times a day (TID) | ORAL | 0 refills | Status: DC | PRN

## 2021-01-24 MED ORDER — HYDROXYZINE HCL 10 MG PO TABS: 10.0000 mg | ORAL_TABLET | Freq: Three times a day (TID) | ORAL | 0 refills | Status: DC | PRN

## 2021-01-24 NOTE — Telephone Encounter (Signed)
  Notes to clinic:  Patient has appt on 02/09/2021 Review for continued use Short term medicaition   Requested Prescriptions  Pending Prescriptions Disp Refills   ibuprofen (ADVIL) 800 MG tablet 60 tablet 0    Sig: Take 1 tablet (800 mg total) by mouth every 8 (eight) hours as needed. Prn pain/headache      Analgesics:  NSAIDS Passed - 01/24/2021 12:29 PM      Passed - Cr in normal range and within 360 days    Creatinine, Ser  Date Value Ref Range Status  01/02/2021 0.60 0.44 - 1.00 mg/dL Final   Creatinine, Urine  Date Value Ref Range Status  02/23/2015 74.44 >20.0 mg/dL Final          Passed - HGB in normal range and within 360 days    Hemoglobin  Date Value Ref Range Status  01/02/2021 13.8 12.0 - 15.0 g/dL Final  41/66/0630 16.0 11.1 - 15.9 g/dL Final          Passed - Patient is not pregnant      Passed - Valid encounter within last 12 months    Recent Outpatient Visits           3 weeks ago Encounter for examination following treatment at hospital   [facility] [place], Marzella Schlein, [place]   10 months ago No-show for appointment   [facility] [place], [place], [place]   2 years ago Dysfunctional uterine bleeding   [facility] Marcine Matar, [place]   3 years ago Anxiety and depression   [facility] Marcine Matar, MD   3 years ago Genital warts   [facility] Marcine Matar, MD       Future Appointments             In 2 weeks Marcine Matar, MD [facility]               hydrOXYzine (ATARAX/VISTARIL) 10 MG tablet 60 tablet 0    Sig: Take 1 tablet (10 mg total) by mouth 3 (three) times daily as needed. Prn anxiety      Ear, Nose, and Throat:  Antihistamines Passed - 01/24/2021 12:29 PM      Passed - Valid encounter within last 12 months    Recent Outpatient Visits            3 weeks ago Encounter for examination following treatment at hospital   [facility] [place], Marzella Schlein, [place]   10 months ago No-show for appointment   [facility] [place], [place], [place]   2 years ago Dysfunctional uterine bleeding   [facility] Marcine Matar, MD   3 years ago Anxiety and depression   [facility] Marcine Matar, MD   3 years ago Genital warts   [facility] Marcine Matar, MD       Future Appointments             In 2 weeks Marcine Matar, MD [facility]

## 2021-01-24 NOTE — Telephone Encounter (Signed)
Medication Refill - Medication: hydrOXYzine (ATARAX/VISTARIL) 10 MG tablet ibuprofen (ADVIL) 800 MG tablet    Preferred Pharmacy (with phone number or street name):  [facility] [phone] - [place] Levan, [place] - [address] AT [facility] [address] Phone:  [phone]  Fax:  [phone]      Agent: Please be advised that RX refills may take up to 3 business days. We ask that you follow-up with your pharmacy.

## 2021-02-09 ENCOUNTER — Ambulatory Visit: Payer: Medicaid Other | Admitting: Internal Medicine

## 2021-03-07 ENCOUNTER — Other Ambulatory Visit: Payer: Self-pay | Admitting: Internal Medicine

## 2021-03-07 DIAGNOSIS — R519 Headache, unspecified: Secondary | ICD-10-CM

## 2021-03-29 ENCOUNTER — Ambulatory Visit: Payer: Medicaid Other | Admitting: Neurology

## 2021-04-02 ENCOUNTER — Encounter: Payer: Self-pay | Admitting: Neurology

## 2021-04-16 ENCOUNTER — Ambulatory Visit (INDEPENDENT_AMBULATORY_CARE_PROVIDER_SITE_OTHER): Payer: Medicaid Other | Admitting: Surgery

## 2021-04-16 ENCOUNTER — Other Ambulatory Visit: Payer: Self-pay

## 2021-04-16 ENCOUNTER — Encounter: Payer: Self-pay | Admitting: Surgery

## 2021-04-16 VITALS — BP 116/78 | HR 79 | Temp 98.0°F | Ht 62.0 in | Wt 146.0 lb

## 2021-04-16 DIAGNOSIS — K439 Ventral hernia without obstruction or gangrene: Secondary | ICD-10-CM | POA: Diagnosis not present

## 2021-04-16 NOTE — Patient Instructions (Addendum)
Request a copy of your records and you may bring them to [place]. Or you may fill out a request for records there and have them sent to [place]. Our Fax number is [phone]. We also need you to get all of your images on a CD, the Cat Scan and Ultrasound and also the reports of these studies and bring this with you to your follow up.   If you need to have your gallbladder removed this should be done before any hernia repair.    Follow up here in 1 week to discuss surgery.   Try to start quitting smoking.   Gallbladder Eating Plan If you have a gallbladder condition, you may have trouble digesting fats. Eating a low-fat diet can help reduce your symptoms, and may be helpful before and after having surgery to remove your gallbladder (cholecystectomy). Your health care provider may recommend that you work with a diet and nutrition specialist (dietitian) to help you reduce the amount of fat in your diet. What are tips for following this plan? General guidelines Limit your fat intake to less than 30% of your total daily calories. If you eat around 1,800 calories each day, this is less than 60 grams (g) of fat per day. Fat is an important part of a healthy diet. Eating a low-fat diet can make it hard to maintain a healthy body weight. Ask your dietitian how much fat, calories, and other nutrients you need each day. Eat small, frequent meals throughout the day instead of three large meals. Drink at least 8-10 cups of fluid a day. Drink enough fluid to keep your urine clear or pale yellow. Limit alcohol intake to no more than 1 drink a day for nonpregnant women and 2 drinks a day for men. One drink equals 12 oz of beer, 5 oz of wine, or 1 oz of hard liquor. Reading food labels  Check Nutrition Facts on food labels for the amount of fat per serving. Choose foods with less than 3 grams of fat per serving. Shopping Choose nonfat and low-fat healthy foods. Look for the words "nonfat," "low fat," or "fat  free." Avoid buying processed or prepackaged foods. Cooking Cook using low-fat methods, such as baking, broiling, grilling, or boiling. Cook with small amounts of healthy fats, such as olive oil, grapeseed oil, canola oil, or sunflower oil. What foods are recommended? All fresh, frozen, or canned fruits and vegetables. Whole grains. Low-fat or non-fat (skim) milk and yogurt. Lean meat, skinless poultry, fish, eggs, and beans. Low-fat protein supplement powders or drinks. Spices and herbs. What foods are not recommended? High-fat foods. These include baked goods, fast food, fatty cuts of meat, ice cream, french toast, sweet rolls, pizza, cheese bread, foods covered with butter, creamy sauces, or cheese. Fried foods. These include french fries, tempura, battered fish, breaded chicken, fried breads, and sweets. Foods with strong odors. Foods that cause bloating and gas. Summary A low-fat diet can be helpful if you have a gallbladder condition, or before and after gallbladder surgery. Limit your fat intake to less than 30% of your total daily calories. This is about 60 g of fat if you eat 1,800 calories each day. Eat small, frequent meals throughout the day instead of three large meals. This information is not intended to replace advice given to you by your health care provider. Make sure you discuss any questions you have with your health care provider. Document Revised: 03/09/2020 Document Reviewed: 03/09/2020 Elsevier Patient Education  2022 [employer].

## 2021-04-16 NOTE — Progress Notes (Signed)
04/16/2021  History of Present Illness: [name] is a 36 y.o. female s/p open ventral hernia repair on 11/03/2017.  No mesh was used at the time as the hernia contents consisted of inflamed omentum and there was concern for possible mesh infection.  She reports that about two months ago, she started feeling bulging again in the lower abdominal area.  This is associated with discomfort and pain and pulling sensation when the contents bulge.  She has also been experiencing bloatedness and pain after eating.  She reports that she's had an abdominal ultrasound and also a CT scan of her abdomen as part of her workup.  She was told she has cholelithiasis, had abnormal LFTs, and she is getting an EGD done tomorrow.  All this is at an outside hospital and I do not have access to those records.  Denies any fevers, chills, chest pain, shortness of breath.  Past Medical History: Past Medical History:  Diagnosis Date   Anemia    Anxiety 11/21/2011   Blood transfusion complicating pregnancy    Cervical lymphadenopathy 04/29/2017   Complication of anesthesia    Swelling from some drug used for anesthesia. Does not  know name.   Depression    Genital warts    History of methicillin resistant staphylococcus aureus (MRSA) 2017   HPV test positive    RhD negative      Past Surgical History: Past Surgical History:  Procedure Laterality Date   CESAREAN SECTION N/A 07/03/2015   Procedure: CESAREAN SECTION;  Surgeon: Levie Heritage, DO;  Location: [facility];  Service: Obstetrics;  Laterality: N/A;   DILATION AND CURETTAGE OF UTERUS     FRACTURE SURGERY Right    pinky finger   fractured finger  2001   open reduction   HYSTEROSCOPY WITH D & C N/A 01/13/2019   Procedure: DILATATION AND CURETTAGE WITH MINERVA ABLATION;  Surgeon: Allie Bossier, MD;  Location: [facility];  Service: Gynecology;  Laterality: N/A;  Rep will be here confirmed 01/08/2019 CS   LAPAROSCOPY N/A 01/13/2019   Procedure:  LAPAROSCOPY DIAGNOSTIC, Lysis of Adhesions, Biopsy Posterior Cul de Sac;  Surgeon: Allie Bossier, MD;  Location: [facility];  Service: Gynecology;  Laterality: N/A;   LESION REMOVAL N/A 01/13/2019   Procedure: EXCISION OF Labial Warts;  Surgeon: Allie Bossier, MD;  Location: [facility];  Service: Gynecology;  Laterality: N/A;   THERAPEUTIC ABORTION     TUBAL LIGATION     VENTRAL HERNIA REPAIR N/A 11/03/2017   Procedure: HERNIA REPAIR VENTRAL ADULT;  Surgeon: Henrene Dodge, MD;  Location: [facility];  Service: General;  Laterality: N/A;    Home Medications: Prior to Admission medications   Medication Sig Start Date End Date Taking? Authorizing Provider  ALPRAZolam Prudy Feeler) 0.25 MG tablet Take 0.5 mg by mouth at bedtime as needed for anxiety.    Yes [provider]  atomoxetine (STRATTERA) 18 MG capsule Take by mouth.   Yes [provider]  busPIRone (BUSPAR) 10 MG tablet Take 1 tablet (10 mg total) by mouth 2 (two) times daily. 01/03/21  Yes Anders Simmonds, PA-C  cariprazine (VRAYLAR) capsule Take 1 capsule (1.5 mg total) by mouth daily. 10/19/20  Yes Mayers, Cari S, PA-C  hydrOXYzine (ATARAX/VISTARIL) 10 MG tablet Take 1 tablet (10 mg total) by mouth 3 (three) times daily as needed. Prn anxiety 01/24/21  Yes Marcine Matar, MD  ibuprofen (ADVIL) 800 MG tablet TAKE 1  TABLET(800 MG) BY MOUTH EVERY 8 HOURS AS NEEDED FOR PAIN OR HEADACHE 03/07/21  Yes Marcine Matar, MD  lidocaine (XYLOCAINE) 2 % jelly Apply 1 application topically as needed. 01/13/19  Yes Allie Bossier, MD    Allergies: Allergies  Allergen Reactions   Other Anaphylaxis    Childhood reaction, finger surgery, total body swelling to anesthesia  medication   Latex Itching and Swelling    Review of Systems: Review of Systems  Constitutional:  Negative for chills and fever.  Respiratory:  Negative for shortness of breath.   Cardiovascular:  Negative for chest pain.   Gastrointestinal:  Positive for abdominal pain and nausea. Negative for constipation, diarrhea and vomiting.  Skin:  Negative for rash.   Physical Exam BP 116/78   Pulse 79   Temp 98 F (36.7 C)   Ht 5\' 2"  (1.575 m)   Wt 146 lb (66.2 kg)   SpO2 97%   BMI 26.70 kg/m  CONSTITUTIONAL: No acute distress, well nourished HEENT:  Normocephalic, atraumatic, extraocular motion intact. RESPIRATORY:  Lungs are clear, and breath sounds are equal bilaterally. Normal respiratory effort without pathologic use of accessory muscles. CARDIOVASCULAR: Heart is regular without murmurs, gallops, or rubs. GI: The abdomen is soft, non-distended, with discomfort to palpation in the lower periumbilical region.  The patient's low midline incision is well healed, but she does have a 3-4 cm hernia defect just below the umbilicus.  The contents are reducible, but the patient feels pulling sensation and discomfort on palpation.  Cannot palpate any other hernia defects along the midline incision. NEUROLOGIC:  Motor and sensation is grossly normal.  Cranial nerves are grossly intact. PSYCH:  Alert and oriented to person, place and time. Affect is normal.  Labs/Imaging: No new images in our system.  Assessment and Plan: This is a 35 y.o. female with recurrent incisional hernia.  --Discussed with the patient the reasoning behind not using mesh on the prior hernia repair.  It appears that she has a recurrence in her incisional hernia.  The hernia is smaller in size overall, but would be useful to have imaging study available to better evaluate her hernia anatomy.  She reports that she had a CT scan of her abdomen/pelvis just recently, in addition to her abdominal ultrasound.  If she does have issues with her gallbladder that would warrant cholecystectomy, discussed with the patient that this may be the first priority and subsequently do her hernia repair.  I would hesitate to do both gallbladder and incisional hernia in  the same setting as there is risk of mesh infection.   --The patient is getting an EGD tomorrow as well.  I have asked her to go to the medical records office at her OSH and obtain a CD with the ultrasound and CT scan images, as well as copies of her laboratory workup, physician notes, and EGD report so that we can better plan any needed surgeries going forwards.  She is in agreement and will get all the records needed.   --She will follow up with me on 9/23 so we can review her records and plan for surgery accordingly.  Face-to-face time spent with the patient and care providers was 25 minutes, with more than 50% of the time spent counseling, educating, and coordinating care of the patient.     10/23, MD [facility]

## 2021-04-23 ENCOUNTER — Ambulatory Visit
Admission: RE | Admit: 2021-04-23 | Discharge: 2021-04-23 | Disposition: A | Discharge status: Home / Self Care | Payer: Self-pay | Source: Ambulatory Visit | Attending: Surgery | Admitting: Surgery

## 2021-04-23 ENCOUNTER — Other Ambulatory Visit: Payer: Self-pay | Admitting: Surgery

## 2021-04-23 DIAGNOSIS — K439 Ventral hernia without obstruction or gangrene: Secondary | ICD-10-CM

## 2021-04-27 ENCOUNTER — Ambulatory Visit: Payer: Medicaid Other | Admitting: Surgery

## 2021-05-07 ENCOUNTER — Ambulatory Visit: Payer: Medicaid Other | Admitting: Surgery

## 2021-05-28 ENCOUNTER — Ambulatory Visit (INDEPENDENT_AMBULATORY_CARE_PROVIDER_SITE_OTHER): Payer: Medicaid Other | Admitting: Surgery

## 2021-05-28 ENCOUNTER — Other Ambulatory Visit: Payer: Self-pay

## 2021-05-28 ENCOUNTER — Encounter: Payer: Self-pay | Admitting: Surgery

## 2021-05-28 VITALS — BP 113/73 | HR 105 | Temp 99.2°F | Ht 59.0 in | Wt 150.4 lb

## 2021-05-28 DIAGNOSIS — K8020 Calculus of gallbladder without cholecystitis without obstruction: Secondary | ICD-10-CM | POA: Diagnosis not present

## 2021-05-28 DIAGNOSIS — K439 Ventral hernia without obstruction or gangrene: Secondary | ICD-10-CM

## 2021-05-28 NOTE — H&P (View-Only) (Signed)
05/28/2021  History of Present Illness: [name] is a 35 y.o. female status post open lower abdominal ventral hernia on 11/03/2017 for incarcerated omentum.  The patient was last seen on 04/16/2021 because of feelings of a new bulge in the lower abdominal area.  I have been able to review her outside records and has been able to review the CT scan images that she had an ultrasound report that she had as well.  Patient reports that besides a ventral hernia pain, she also has right upper quadrant pain and her ultrasound did show cholelithiasis with an 11 mm gallstone but no evidence of cholecystitis.  She reports that the right upper quadrant pain is happening daily and she feels like it is becoming a little bit more intense compared to previous.  She still has also at the ventral hernia pain particularly below the umbilicus which was the original hernia repair.  Her CT scan 2 areas of hernia including the prior hernia repair on the superior portion of it and 1 more smaller hernia superior to the umbilicus.  Both of these are fat-containing.  She is interested in proceeding with surgical repair but she is also wondering what to do about her gallbladder.  Past Medical History: Past Medical History:  Diagnosis Date   Anemia    Anxiety 11/21/2011   Blood transfusion complicating pregnancy    Cervical lymphadenopathy 04/29/2017   Complication of anesthesia    Swelling from some drug used for anesthesia. Does not  know name.   Depression    Genital warts    History of methicillin resistant staphylococcus aureus (MRSA) 2017   HPV test positive    RhD negative      Past Surgical History: Past Surgical History:  Procedure Laterality Date   CESAREAN SECTION N/A 07/03/2015   Procedure: CESAREAN SECTION;  Surgeon: Levie Heritage, DO;  Location: [facility];  Service: Obstetrics;  Laterality: N/A;   DILATION AND CURETTAGE OF UTERUS     FRACTURE SURGERY Right    pinky finger   fractured finger  2001    open reduction   HYSTEROSCOPY WITH D & C N/A 01/13/2019   Procedure: DILATATION AND CURETTAGE WITH MINERVA ABLATION;  Surgeon: Allie Bossier, MD;  Location: [facility];  Service: Gynecology;  Laterality: N/A;  Rep will be here confirmed 01/08/2019 CS   LAPAROSCOPY N/A 01/13/2019   Procedure: LAPAROSCOPY DIAGNOSTIC, Lysis of Adhesions, Biopsy Posterior Cul de Sac;  Surgeon: Allie Bossier, MD;  Location: [facility];  Service: Gynecology;  Laterality: N/A;   LESION REMOVAL N/A 01/13/2019   Procedure: EXCISION OF Labial Warts;  Surgeon: Allie Bossier, MD;  Location: [facility];  Service: Gynecology;  Laterality: N/A;   THERAPEUTIC ABORTION     TUBAL LIGATION     VENTRAL HERNIA REPAIR N/A 11/03/2017   Procedure: HERNIA REPAIR VENTRAL ADULT;  Surgeon: Henrene Dodge, MD;  Location: [facility];  Service: General;  Laterality: N/A;    Home Medications: Prior to Admission medications   Medication Sig Start Date End Date Taking? Authorizing Provider  ALPRAZolam Prudy Feeler) 0.25 MG tablet Take 0.25 mg by mouth at bedtime as needed for anxiety.   Yes [provider]  cariprazine (VRAYLAR) capsule Take 1 capsule (1.5 mg total) by mouth daily. 10/19/20  Yes Mayers, Cari S, PA-C  hydrOXYzine (ATARAX/VISTARIL) 10 MG tablet Take 1 tablet (10 mg total) by mouth 3 (three) times daily as needed. Prn anxiety 01/24/21  Yes Johnson, Deborah B, MD    Allergies: Allergies  Allergen Reactions   Other Anaphylaxis    Childhood reaction, finger surgery, total body swelling to anesthesia  medication   Latex Itching and Swelling    Review of Systems: Review of Systems  Constitutional:  Negative for chills and fever.  HENT:  Negative for hearing loss.   Respiratory:  Negative for shortness of breath.   Cardiovascular:  Negative for chest pain.  Gastrointestinal:  Positive for abdominal pain and nausea. Negative for blood in stool, constipation, diarrhea and vomiting.   Genitourinary:  Negative for dysuria.  Musculoskeletal:  Negative for myalgias.  Skin:  Negative for rash.  Neurological:  Negative for dizziness.  Psychiatric/Behavioral:  Negative for depression.    Physical Exam BP 113/73   Pulse (!) 105   Temp 99.2 F (37.3 C) (Oral)   Ht 4' 11" (1.499 m)   Wt 150 lb 6.4 oz (68.2 kg)   SpO2 98%   BMI 30.38 kg/m  CONSTITUTIONAL: No acute distress, well-nourished HEENT:  Normocephalic, atraumatic, extraocular motion intact. NECK: Trachea is midline and there is no jugular venous distention. RESPIRATORY:  Lungs are clear, and breath sounds are equal bilaterally. Normal respiratory effort without pathologic use of accessory muscles. CARDIOVASCULAR: Heart is regular without murmurs, gallops, or rubs. GI: The abdomen is soft, nondistended, with tenderness to palpation in the right upper quadrant with a negative Murphy sign, and tenderness to palpation in the midline particularly below the umbilicus where the larger hernia is and superior to the umbilicus where the small hernia is.  There is no evidence of strangulation at this point and the hernias feel soft and partially reducible.  NEUROLOGIC:  Motor and sensation is grossly normal.  Cranial nerves are grossly intact. PSYCH:  Alert and oriented to person, place and time. Affect is normal.  Labs/Imaging: Labs from 04/03/2021: Sodium 143, potassium 3.7, chloride 104, CO2 27.9, BUN 9, creatinine 0.7.  Total bilirubin 0.3, AST 16, ALT 16, alkaline phosphatase 74, albumin 4.5.  CT scan of abdomen and pelvis on 04/12/2021: There is a ventral hernia 5 cm above the umbilicus with a sagittal dimension of 2 cm, anterior to posterior dimension of 17 mm, transverse dimension of 20 mm.  This is filled with fat.  No inflammation or incarceration.  Periumbilical hernia with a wide waist measuring 3 cm just inferior to the umbilicus and lateral to the midline measuring 3 cm in width and 2 cm in his anterior-posterior  dimension and 3 cm in the sagittal dimension filled with fat.  No incarceration or inflammation.  Ultrasound of abdomen on 02/16/2021: 11 mm gallstone.  No gallbladder wall thickening or pericholecystic fluid.  No evidence of cholecystitis.  Common bile duct measures 2 mm.  No intrahepatic biliary dilatation.  Liver size and echotexture is normal.  Hepatopetal flow present in the portal vein.  Assessment and Plan: This is a 35 y.o. female with recurrent ventral hernia and also with symptomatic cholelithiasis.  - Discussed with the patient that given her symptoms with her gallbladder, it would be prudent to also proceed with cholecystectomy.  However also discussed with the patient that it would not be a good idea to combine both cholecystectomy and ventral hernia repair with mesh as there would be a higher risk of mesh infection due to manipulation of the gallbladder.  I think at this point it would be best to proceed first with robotic assisted cholecystectomy and then a few weeks later to schedule   her for a robotic assisted ventral hernia repair.  She is in agreement.  Reviewed with her then the plan for a robotic assisted cholecystectomy with ICG cholangiogram and discussed the surgery at length with her particularly the risks of bleeding, infection, injury to surrounding structures, and she is willing to proceed.  She understands that this will be an outpatient surgery, discussed also with her the postoperative recovery and activity restrictions, and all of her questions have been answered. - We will schedule her for robotic assisted cholecystectomy on 06/06/2021.  I spent 40 minutes dedicated to the care of this patient on the date of this encounter to include pre-visit review of records, face-to-face time with the patient discussing diagnosis and management, and any post-visit coordination of care.   Levelle Edelen Luis Ebone Alcivar, MD [facility]     

## 2021-05-28 NOTE — Progress Notes (Signed)
05/28/2021  History of Present Illness: [name] is a 35 y.o. female status post open lower abdominal ventral hernia on 11/03/2017 for incarcerated omentum.  The patient was last seen on 04/16/2021 because of feelings of a new bulge in the lower abdominal area.  I have been able to review her outside records and has been able to review the CT scan images that she had an ultrasound report that she had as well.  Patient reports that besides a ventral hernia pain, she also has right upper quadrant pain and her ultrasound did show cholelithiasis with an 11 mm gallstone but no evidence of cholecystitis.  She reports that the right upper quadrant pain is happening daily and she feels like it is becoming a little bit more intense compared to previous.  She still has also at the ventral hernia pain particularly below the umbilicus which was the original hernia repair.  Her CT scan 2 areas of hernia including the prior hernia repair on the superior portion of it and 1 more smaller hernia superior to the umbilicus.  Both of these are fat-containing.  She is interested in proceeding with surgical repair but she is also wondering what to do about her gallbladder.  Past Medical History: Past Medical History:  Diagnosis Date   Anemia    Anxiety 11/21/2011   Blood transfusion complicating pregnancy    Cervical lymphadenopathy 04/29/2017   Complication of anesthesia    Swelling from some drug used for anesthesia. Does not  know name.   Depression    Genital warts    History of methicillin resistant staphylococcus aureus (MRSA) 2017   HPV test positive    RhD negative      Past Surgical History: Past Surgical History:  Procedure Laterality Date   CESAREAN SECTION N/A 07/03/2015   Procedure: CESAREAN SECTION;  Surgeon: Levie Heritage, DO;  Location: [facility];  Service: Obstetrics;  Laterality: N/A;   DILATION AND CURETTAGE OF UTERUS     FRACTURE SURGERY Right    pinky finger   fractured finger  2001    open reduction   HYSTEROSCOPY WITH D & C N/A 01/13/2019   Procedure: DILATATION AND CURETTAGE WITH MINERVA ABLATION;  Surgeon: Allie Bossier, MD;  Location: [facility];  Service: Gynecology;  Laterality: N/A;  Rep will be here confirmed 01/08/2019 [facility]   LAPAROSCOPY N/A 01/13/2019   Procedure: LAPAROSCOPY DIAGNOSTIC, Lysis of Adhesions, Biopsy Posterior Cul de Sac;  Surgeon: Allie Bossier, MD;  Location: [facility];  Service: Gynecology;  Laterality: N/A;   LESION REMOVAL N/A 01/13/2019   Procedure: EXCISION OF Labial Warts;  Surgeon: Allie Bossier, MD;  Location: [facility];  Service: Gynecology;  Laterality: N/A;   THERAPEUTIC ABORTION     TUBAL LIGATION     VENTRAL HERNIA REPAIR N/A 11/03/2017   Procedure: HERNIA REPAIR VENTRAL ADULT;  Surgeon: Henrene Dodge, MD;  Location: [facility];  Service: General;  Laterality: N/A;    Home Medications: Prior to Admission medications   Medication Sig Start Date End Date Taking? Authorizing Provider  ALPRAZolam Prudy Feeler) 0.25 MG tablet Take 0.25 mg by mouth at bedtime as needed for anxiety.   Yes [provider]  cariprazine (VRAYLAR) capsule Take 1 capsule (1.5 mg total) by mouth daily. 10/19/20  Yes Mayers, Cari S, PA-C  hydrOXYzine (ATARAX/VISTARIL) 10 MG tablet Take 1 tablet (10 mg total) by mouth 3 (three) times daily as needed. Prn anxiety 01/24/21  Yes Marcine Matar, MD    Allergies: Allergies  Allergen Reactions   Other Anaphylaxis    Childhood reaction, finger surgery, total body swelling to anesthesia  medication   Latex Itching and Swelling    Review of Systems: Review of Systems  Constitutional:  Negative for chills and fever.  HENT:  Negative for hearing loss.   Respiratory:  Negative for shortness of breath.   Cardiovascular:  Negative for chest pain.  Gastrointestinal:  Positive for abdominal pain and nausea. Negative for blood in stool, constipation, diarrhea and vomiting.   Genitourinary:  Negative for dysuria.  Musculoskeletal:  Negative for myalgias.  Skin:  Negative for rash.  Neurological:  Negative for dizziness.  Psychiatric/Behavioral:  Negative for depression.    Physical Exam BP 113/73   Pulse (!) 105   Temp 99.2 F (37.3 C) (Oral)   Ht 4\' 11"  (1.499 m)   Wt 150 lb 6.4 oz (68.2 kg)   SpO2 98%   BMI 30.38 kg/m  CONSTITUTIONAL: No acute distress, well-nourished HEENT:  Normocephalic, atraumatic, extraocular motion intact. NECK: Trachea is midline and there is no jugular venous distention. RESPIRATORY:  Lungs are clear, and breath sounds are equal bilaterally. Normal respiratory effort without pathologic use of accessory muscles. CARDIOVASCULAR: Heart is regular without murmurs, gallops, or rubs. GI: The abdomen is soft, nondistended, with tenderness to palpation in the right upper quadrant with a negative Murphy sign, and tenderness to palpation in the midline particularly below the umbilicus where the larger hernia is and superior to the umbilicus where the small hernia is.  There is no evidence of strangulation at this point and the hernias feel soft and partially reducible.  NEUROLOGIC:  Motor and sensation is grossly normal.  Cranial nerves are grossly intact. PSYCH:  Alert and oriented to person, place and time. Affect is normal.  Labs/Imaging: Labs from 04/03/2021: Sodium 143, potassium 3.7, chloride 104, CO2 27.9, BUN 9, creatinine 0.7.  Total bilirubin 0.3, AST 16, ALT 16, alkaline phosphatase 74, albumin 4.5.  CT scan of abdomen and pelvis on 04/12/2021: There is a ventral hernia 5 cm above the umbilicus with a sagittal dimension of 2 cm, anterior to posterior dimension of 17 mm, transverse dimension of 20 mm.  This is filled with fat.  No inflammation or incarceration.  Periumbilical hernia with a wide waist measuring 3 cm just inferior to the umbilicus and lateral to the midline measuring 3 cm in width and 2 cm in his anterior-posterior  dimension and 3 cm in the sagittal dimension filled with fat.  No incarceration or inflammation.  Ultrasound of abdomen on 02/16/2021: 11 mm gallstone.  No gallbladder wall thickening or pericholecystic fluid.  No evidence of cholecystitis.  Common bile duct measures 2 mm.  No intrahepatic biliary dilatation.  Liver size and echotexture is normal.  Hepatopetal flow present in the portal vein.  Assessment and Plan: This is a 35 y.o. female with recurrent ventral hernia and also with symptomatic cholelithiasis.  - Discussed with the patient that given her symptoms with her gallbladder, it would be prudent to also proceed with cholecystectomy.  However also discussed with the patient that it would not be a good idea to combine both cholecystectomy and ventral hernia repair with mesh as there would be a higher risk of mesh infection due to manipulation of the gallbladder.  I think at this point it would be best to proceed first with robotic assisted cholecystectomy and then a few weeks later to schedule  her for a robotic assisted ventral hernia repair.  She is in agreement.  Reviewed with her then the plan for a robotic assisted cholecystectomy with ICG cholangiogram and discussed the surgery at length with her particularly the risks of bleeding, infection, injury to surrounding structures, and she is willing to proceed.  She understands that this will be an outpatient surgery, discussed also with her the postoperative recovery and activity restrictions, and all of her questions have been answered. - We will schedule her for robotic assisted cholecystectomy on 06/06/2021.  I spent 40 minutes dedicated to the care of this patient on the date of this encounter to include pre-visit review of records, face-to-face time with the patient discussing diagnosis and management, and any post-visit coordination of care.   Howie Ill, MD [facility]

## 2021-05-28 NOTE — Patient Instructions (Addendum)
Our surgery scheduler will call you within 24-48 hours to schedule your surgery. Please have the Blue surgery sheet available when speaking with her.   Minimally Invasive Cholecystectomy Minimally invasive cholecystectomy is surgery to remove the gallbladder. The gallbladder is a pear-shaped organ that lies beneath the liver on the right side of the body. The gallbladder stores bile, which is a fluid that helps the body digest fats. Cholecystectomy is often done to treat inflammation of the gallbladder (cholecystitis). This condition is usually caused by a buildup of gallstones (cholelithiasis) in the gallbladder. Gallstones can block the flow of bile, which can result in inflammation and pain. In severe cases, emergency surgery may be required. This procedure is done though small incisions in the abdomen, instead of one large incision. It is also called laparoscopic surgery. A thin scope with a camera (laparoscope) is inserted through one incision. Then surgical instruments are inserted through the other incisions. In some cases, a minimally invasive surgery may need to be changed to a surgery that is done through a larger incision. This is called open surgery. Tell a health care provider about: Any allergies you have. All medicines you are taking, including vitamins, herbs, eye drops, creams, and over-the-counter medicines. Any problems you or family members have had with anesthetic medicines. Any blood disorders you have. Any surgeries you have had. Any medical conditions you have. Whether you are pregnant or may be pregnant. What are the risks? Generally, this is a safe procedure. However, problems may occur, including: Infection. Bleeding. Allergic reactions to medicines. Damage to nearby structures or organs. A stone remaining in the common bile duct. The common bile duct carries bile from the gallbladder into the small intestine. A bile leak from the cyst duct that is clipped when your  gallbladder is removed. What happens before the procedure? Staying hydrated Follow instructions from your health care provider about hydration, which may include: Up to 2 hours before the procedure - you may continue to drink clear liquids, such as water, clear fruit juice, black coffee, and plain tea.  Eating and drinking restrictions Follow instructions from your health care provider about eating and drinking, which may include: 8 hours before the procedure - stop eating heavy meals or foods, such as meat, fried foods, or fatty foods. 6 hours before the procedure - stop eating light meals or foods, such as toast or cereal. 6 hours before the procedure - stop drinking milk or drinks that contain milk. 2 hours before the procedure - stop drinking clear liquids. Medicines Ask your health care provider about: Changing or stopping your regular medicines. This is especially important if you are taking diabetes medicines or blood thinners. Taking medicines such as aspirin and ibuprofen. These medicines can thin your blood. Do not take these medicines unless your health care provider tells you to take them. Taking over-the-counter medicines, vitamins, herbs, and supplements. General instructions Let your health care provider know if you develop a cold or an infection before surgery. Plan to have someone take you home from the hospital or clinic. If you will be going home right after the procedure, plan to have someone with you for 24 hours. Ask your health care provider: How your surgery site will be marked. What steps will be taken to help prevent infection. These may include: Removing hair at the surgery site. Washing skin with a germ-killing soap. Taking antibiotic medicine. What happens during the procedure?  An IV will be inserted into one of your veins. You will  be given one or both of the following: A medicine to help you relax (sedative). A medicine to make you fall asleep (general  anesthetic). A breathing tube will be placed in your mouth. Your surgeon will make several small incisions in your abdomen. The laparoscope will be inserted through one of the small incisions. The camera on the laparoscope will send images to a monitor in the operating room. This lets your surgeon see inside your abdomen. A gas will be pumped into your abdomen. This will expand your abdomen to give the surgeon more room to perform the surgery. Other tools that are needed for the procedure will be inserted through the other incisions. The gallbladder will be removed through one of the incisions. Your common bile duct may be examined. If stones are found in the common bile duct, they may be removed. After your gallbladder has been removed, the incisions will be closed with stitches (sutures), staples, or skin glue. Your incisions may be covered with a bandage (dressing). The procedure may vary among health care providers and hospitals. What happens after the procedure? Your blood pressure, heart rate, breathing rate, and blood oxygen level will be monitored until you leave the hospital or clinic. You will be given medicines as needed to control your pain. If you were given a sedative during the procedure, it can affect you for several hours. Do not drive or operate machinery until your health care provider says that it is safe. Summary Minimally invasive cholecystectomy, also called laparoscopic cholecystectomy, is surgery to remove the gallbladder using small incisions. Tell your health care provider about all the medical conditions you have and all the medicines you are taking for those conditions. Before the procedure, follow instructions about eating or drinking restrictions and changing or stopping medicines. If you were given a sedative during the procedure, it can affect you for several hours. Do not drive or operate machinery until your health care provider says that it is safe. This  information is not intended to replace advice given to you by your health care provider. Make sure you discuss any questions you have with your health care provider. Document Revised: 04/26/2019 Document Reviewed: 04/26/2019 Elsevier Patient Education  2022 [employer].

## 2021-05-29 ENCOUNTER — Telehealth: Payer: Self-pay | Admitting: Surgery

## 2021-05-29 NOTE — Telephone Encounter (Signed)
Patient has been advised of Pre-Admission date/time, COVID Testing date and Surgery date.  Surgery Date: 06/06/21 Preadmission Testing Date: 06/04/21 (phone 1p-5p) Covid Testing Date: Not needed.     Patient has been made aware to call [phone], between 1-3:00pm the day before surgery, to find out what time to arrive for surgery.

## 2021-06-04 ENCOUNTER — Other Ambulatory Visit: Payer: Self-pay

## 2021-06-04 ENCOUNTER — Other Ambulatory Visit
Admission: RE | Admit: 2021-06-04 | Discharge: 2021-06-04 | Disposition: A | Discharge status: Home / Self Care | Payer: Medicaid Other | Source: Ambulatory Visit | Attending: Surgery | Admitting: Surgery

## 2021-06-04 NOTE — Patient Instructions (Signed)
Your procedure is scheduled on: Wednesday June 06, 2021. Report to Day Surgery inside [facility] 2nd floor, stop by admissions desk first before getting on elevator.  To find out your arrival time please call [phone] between 1PM - 3PM on Tuesday June 05, 2021.  Remember: Instructions that are not followed completely may result in serious medical risk,  up to and including death, or upon the discretion of your surgeon and anesthesiologist your  surgery may need to be rescheduled.     _X__ 1. Do not eat food after midnight the night before your procedure.                 No chewing gum or hard candies. You may drink clear liquids up to 2 hours                 before you are scheduled to arrive for your surgery- DO not drink clear                 liquids within 2 hours of the start of your surgery.                 Clear Liquids include:  water, apple juice without pulp, clear Gatorade, G2 or                  Gatorade Zero (avoid Red/Purple/Blue), Black Coffee or Tea (Do not add                 anything to coffee or tea).  __X__2.  On the morning of surgery brush your teeth with toothpaste and water, you                may rinse your mouth with mouthwash if you wish.  Do not swallow any toothpaste or mouthwash.     _X__ 3.  No Alcohol for 24 hours before or after surgery.   _X__ 4.  Do Not Smoke or use e-cigarettes For 24 Hours Prior to Your Surgery.                 Do not use any chewable tobacco products for at least 6 hours prior to                 Surgery.  _X__  5.  Do not use any recreational drugs (marijuana, cocaine, heroin, ecstasy, MDMA or other)                For at least one week prior to your surgery.  Combination of these drugs with anesthesia                May have life threatening results.  _X___ 6.  Notify your doctor if there is any change in your medical condition      (cold, fever, infections).     Do not wear jewelry, make-up,  hairpins, clips or nail polish. Do not wear lotions, powders, or perfumes. You may wear deodorant. Do not shave 48 hours prior to surgery.  Do not bring valuables to the hospital.    [facility] is not responsible for any belongings or valuables.  Contacts, dentures or bridgework may not be worn into surgery. Leave your suitcase in the car. After surgery it may be brought to your room. For patients admitted to the hospital, discharge time is determined by your treatment team.   Patients discharged the day of surgery will not be allowed to drive home.   Make  arrangements for someone to be with you for the first 24 hours of your Same Day Discharge.   __X__ Take these medicines the morning of surgery with A SIP OF WATER:    1. cariprazine (VRAYLAR) capsule  2. omeprazole (PRILOSEC) 40 MG   3. ALPRAZolam (XANAX) 0.5 MG  4. cloNIDine HCl (KAPVAY) 0.1 MG   5.  6.  ____ Fleet Enema (as directed)   __X__ Use Antibacterial Soap as directed  ____ Use Benzoyl Peroxide Gel as instructed  ____ Use inhalers on the day of surgery  ____ Stop metformin 2 days prior to surgery    ____ Take 1/2 of usual insulin dose the night before surgery. No insulin the morning          of surgery.   ____ Call your PCP, cardiologist, or Pulmonologist if taking Coumadin/Plavix/aspirin and ask when to stop before your surgery.   __X__ One Week prior to surgery- Stop Anti-inflammatories such as Ibuprofen, Aleve, Advil, Motrin, meloxicam (MOBIC), diclofenac, etodolac, ketorolac, Toradol, Daypro, piroxicam, Goody's or BC powders. OK TO USE TYLENOL IF NEEDED   __X__ Stop supplements until after surgery.    ____ Bring C-Pap to the hospital.    If you have any questions regarding your pre-procedure instructions,  Please call Pre-admit Testing at [phone]

## 2021-06-06 ENCOUNTER — Encounter
Admission: RE | Disposition: A | Discharge status: Home / Self Care | Payer: Self-pay | Source: Home / Self Care | Attending: Surgery

## 2021-06-06 ENCOUNTER — Encounter: Payer: Self-pay | Admitting: Surgery

## 2021-06-06 ENCOUNTER — Other Ambulatory Visit: Payer: Self-pay

## 2021-06-06 ENCOUNTER — Ambulatory Visit
Admission: RE | Admit: 2021-06-06 | Discharge: 2021-06-06 | Disposition: A | Discharge status: Home / Self Care | Payer: Medicaid Other | Attending: Surgery | Admitting: Surgery

## 2021-06-06 ENCOUNTER — Ambulatory Visit: Payer: Medicaid Other | Admitting: Anesthesiology

## 2021-06-06 DIAGNOSIS — F17210 Nicotine dependence, cigarettes, uncomplicated: Secondary | ICD-10-CM | POA: Diagnosis not present

## 2021-06-06 DIAGNOSIS — K8010 Calculus of gallbladder with chronic cholecystitis without obstruction: Secondary | ICD-10-CM | POA: Diagnosis present

## 2021-06-06 DIAGNOSIS — K8020 Calculus of gallbladder without cholecystitis without obstruction: Secondary | ICD-10-CM | POA: Diagnosis not present

## 2021-06-06 DIAGNOSIS — K432 Incisional hernia without obstruction or gangrene: Secondary | ICD-10-CM | POA: Insufficient documentation

## 2021-06-06 DIAGNOSIS — E669 Obesity, unspecified: Secondary | ICD-10-CM | POA: Diagnosis not present

## 2021-06-06 DIAGNOSIS — Z6830 Body mass index (BMI) 30.0-30.9, adult: Secondary | ICD-10-CM | POA: Insufficient documentation

## 2021-06-06 HISTORY — PX: CHOLECYSTECTOMY: SHX55

## 2021-06-06 LAB — GLUCOSE, CAPILLARY
Glucose-Capillary: 101 mg/dL — ABNORMAL HIGH (ref 70–99)
Glucose-Capillary: 113 mg/dL — ABNORMAL HIGH (ref 70–99)

## 2021-06-06 LAB — POCT PREGNANCY, URINE: Preg Test, Ur: NEGATIVE

## 2021-06-06 SURGERY — CHOLECYSTECTOMY, ROBOT-ASSISTED, LAPAROSCOPIC
Anesthesia: General | Site: Abdomen

## 2021-06-06 MED ORDER — DEXAMETHASONE SODIUM PHOSPHATE 10 MG/ML IJ SOLN
INTRAMUSCULAR | Status: AC
  Filled 2021-06-06: qty 1

## 2021-06-06 MED ORDER — ACETAMINOPHEN 500 MG PO TABS
ORAL_TABLET | ORAL | Status: AC
  Administered 2021-06-06: 1000 mg via ORAL
  Filled 2021-06-06: qty 2

## 2021-06-06 MED ORDER — BUPIVACAINE-EPINEPHRINE (PF) 0.25% -1:200000 IJ SOLN
INTRAMUSCULAR | Status: DC | PRN
  Administered 2021-06-06: 30 mL

## 2021-06-06 MED ORDER — CHLORHEXIDINE GLUCONATE 0.12 % MT SOLN: 15.0000 mL | Freq: Once | OROMUCOSAL | Status: AC

## 2021-06-06 MED ORDER — CHLORHEXIDINE GLUCONATE 0.12 % MT SOLN
OROMUCOSAL | Status: AC
  Administered 2021-06-06: 15 mL via OROMUCOSAL
  Filled 2021-06-06: qty 15

## 2021-06-06 MED ORDER — PROPOFOL 10 MG/ML IV BOLUS
INTRAVENOUS | Status: AC
  Filled 2021-06-06: qty 20

## 2021-06-06 MED ORDER — FENTANYL CITRATE (PF) 100 MCG/2ML IJ SOLN
25.0000 ug | INTRAMUSCULAR | Status: AC | PRN
  Administered 2021-06-06 (×4): 25 ug via INTRAVENOUS

## 2021-06-06 MED ORDER — BUPIVACAINE-EPINEPHRINE (PF) 0.25% -1:200000 IJ SOLN
INTRAMUSCULAR | Status: AC
  Filled 2021-06-06: qty 30

## 2021-06-06 MED ORDER — BUPIVACAINE LIPOSOME 1.3 % IJ SUSP
INTRAMUSCULAR | Status: AC
  Filled 2021-06-06: qty 10

## 2021-06-06 MED ORDER — FENTANYL CITRATE (PF) 100 MCG/2ML IJ SOLN
INTRAMUSCULAR | Status: AC
  Filled 2021-06-06: qty 2

## 2021-06-06 MED ORDER — LIDOCAINE HCL (PF) 2 % IJ SOLN
INTRAMUSCULAR | Status: AC
  Filled 2021-06-06: qty 5

## 2021-06-06 MED ORDER — DEXMEDETOMIDINE (PRECEDEX) IN NS 20 MCG/5ML (4 MCG/ML) IV SYRINGE
PREFILLED_SYRINGE | INTRAVENOUS | Status: DC | PRN
  Administered 2021-06-06: 8 ug via INTRAVENOUS
  Administered 2021-06-06: 4 ug via INTRAVENOUS
  Administered 2021-06-06: 8 ug via INTRAVENOUS

## 2021-06-06 MED ORDER — LIDOCAINE HCL (CARDIAC) PF 100 MG/5ML IV SOSY
PREFILLED_SYRINGE | INTRAVENOUS | Status: DC | PRN
  Administered 2021-06-06: 80 mg via INTRAVENOUS
  Administered 2021-06-06: 20 mg via INTRAVENOUS

## 2021-06-06 MED ORDER — SUGAMMADEX SODIUM 200 MG/2ML IV SOLN
INTRAVENOUS | Status: DC | PRN
  Administered 2021-06-06: 150 mg via INTRAVENOUS

## 2021-06-06 MED ORDER — CHLORHEXIDINE GLUCONATE CLOTH 2 % EX PADS: 6.0000 | MEDICATED_PAD | Freq: Once | CUTANEOUS | Status: DC

## 2021-06-06 MED ORDER — IBUPROFEN 600 MG PO TABS: 600.0000 mg | ORAL_TABLET | Freq: Three times a day (TID) | ORAL | 1 refills | Status: DC | PRN

## 2021-06-06 MED ORDER — OXYCODONE HCL 5 MG PO TABS
ORAL_TABLET | ORAL | Status: AC
  Administered 2021-06-06: 5 mg via ORAL
  Filled 2021-06-06: qty 1

## 2021-06-06 MED ORDER — MIDAZOLAM HCL 2 MG/2ML IJ SOLN
INTRAMUSCULAR | Status: AC
  Filled 2021-06-06: qty 2

## 2021-06-06 MED ORDER — ONDANSETRON HCL 4 MG/2ML IJ SOLN
INTRAMUSCULAR | Status: AC
  Filled 2021-06-06: qty 2

## 2021-06-06 MED ORDER — ACETAMINOPHEN 500 MG PO TABS: 1000.0000 mg | ORAL_TABLET | ORAL | Status: AC

## 2021-06-06 MED ORDER — ACETAMINOPHEN 10 MG/ML IV SOLN: 1000.0000 mg | Freq: Once | INTRAVENOUS | Status: DC | PRN

## 2021-06-06 MED ORDER — FENTANYL CITRATE (PF) 100 MCG/2ML IJ SOLN
INTRAMUSCULAR | Status: AC
  Administered 2021-06-06: 25 ug via INTRAVENOUS
  Filled 2021-06-06: qty 2

## 2021-06-06 MED ORDER — PROPOFOL 10 MG/ML IV BOLUS
INTRAVENOUS | Status: DC | PRN
  Administered 2021-06-06: 160 mg via INTRAVENOUS

## 2021-06-06 MED ORDER — PHENYLEPHRINE HCL (PRESSORS) 10 MG/ML IV SOLN
INTRAVENOUS | Status: DC | PRN
  Administered 2021-06-06: 160 ug via INTRAVENOUS

## 2021-06-06 MED ORDER — OXYCODONE HCL 5 MG/5ML PO SOLN: 5.0000 mg | Freq: Once | ORAL | Status: AC | PRN

## 2021-06-06 MED ORDER — ROCURONIUM BROMIDE 10 MG/ML (PF) SYRINGE
PREFILLED_SYRINGE | INTRAVENOUS | Status: AC
  Filled 2021-06-06: qty 10

## 2021-06-06 MED ORDER — MIDAZOLAM HCL 2 MG/2ML IJ SOLN
INTRAMUSCULAR | Status: DC | PRN
  Administered 2021-06-06: 2 mg via INTRAVENOUS

## 2021-06-06 MED ORDER — CEFAZOLIN SODIUM-DEXTROSE 2-4 GM/100ML-% IV SOLN
INTRAVENOUS | Status: AC
  Filled 2021-06-06: qty 100

## 2021-06-06 MED ORDER — DEXAMETHASONE SODIUM PHOSPHATE 10 MG/ML IJ SOLN
INTRAMUSCULAR | Status: DC | PRN
  Administered 2021-06-06: 5 mg via INTRAVENOUS

## 2021-06-06 MED ORDER — ROCURONIUM BROMIDE 100 MG/10ML IV SOLN
INTRAVENOUS | Status: DC | PRN
  Administered 2021-06-06: 20 mg via INTRAVENOUS
  Administered 2021-06-06: 40 mg via INTRAVENOUS

## 2021-06-06 MED ORDER — ACETAMINOPHEN 500 MG PO TABS: 1000.0000 mg | ORAL_TABLET | Freq: Four times a day (QID) | ORAL | Status: DC | PRN

## 2021-06-06 MED ORDER — DEXMEDETOMIDINE (PRECEDEX) IN NS 20 MCG/5ML (4 MCG/ML) IV SYRINGE
PREFILLED_SYRINGE | INTRAVENOUS | Status: AC
  Filled 2021-06-06: qty 5

## 2021-06-06 MED ORDER — KETOROLAC TROMETHAMINE 30 MG/ML IJ SOLN
INTRAMUSCULAR | Status: DC | PRN
  Administered 2021-06-06: 30 mg via INTRAVENOUS

## 2021-06-06 MED ORDER — 0.9 % SODIUM CHLORIDE (POUR BTL) OPTIME
TOPICAL | Status: DC | PRN
  Administered 2021-06-06: 500 mL

## 2021-06-06 MED ORDER — LACTATED RINGERS IV SOLN: INTRAVENOUS | Status: DC

## 2021-06-06 MED ORDER — ONDANSETRON HCL 4 MG/2ML IJ SOLN: 4.0000 mg | Freq: Once | INTRAMUSCULAR | Status: DC | PRN

## 2021-06-06 MED ORDER — ONDANSETRON HCL 4 MG/2ML IJ SOLN
INTRAMUSCULAR | Status: DC | PRN
  Administered 2021-06-06: 4 mg via INTRAVENOUS

## 2021-06-06 MED ORDER — OXYCODONE HCL 5 MG PO TABS: 5.0000 mg | ORAL_TABLET | Freq: Once | ORAL | Status: AC

## 2021-06-06 MED ORDER — OXYCODONE HCL 5 MG PO TABS
5.0000 mg | ORAL_TABLET | Freq: Once | ORAL | Status: AC | PRN
Start: 2021-06-06 — End: 2021-06-06

## 2021-06-06 MED ORDER — ORAL CARE MOUTH RINSE: 15.0000 mL | Freq: Once | OROMUCOSAL | Status: AC

## 2021-06-06 MED ORDER — FENTANYL CITRATE (PF) 100 MCG/2ML IJ SOLN
INTRAMUSCULAR | Status: DC | PRN
  Administered 2021-06-06 (×4): 50 ug via INTRAVENOUS

## 2021-06-06 MED ORDER — INDOCYANINE GREEN 25 MG IV SOLR
2.5000 mg | INTRAVENOUS | Status: AC
  Administered 2021-06-06: 2.5 mg via INTRAVENOUS
  Filled 2021-06-06: qty 10

## 2021-06-06 MED ORDER — OXYCODONE HCL 5 MG PO TABS: 5.0000 mg | ORAL_TABLET | ORAL | 0 refills | Status: DC | PRN

## 2021-06-06 MED ORDER — CEFAZOLIN SODIUM-DEXTROSE 2-4 GM/100ML-% IV SOLN
2.0000 g | INTRAVENOUS | Status: AC
  Administered 2021-06-06: 2 g via INTRAVENOUS

## 2021-06-06 MED ORDER — GABAPENTIN 300 MG PO CAPS
ORAL_CAPSULE | ORAL | Status: AC
  Administered 2021-06-06: 300 mg via ORAL
  Filled 2021-06-06: qty 1

## 2021-06-06 MED ORDER — GABAPENTIN 300 MG PO CAPS: 300.0000 mg | ORAL_CAPSULE | ORAL | Status: AC

## 2021-06-06 SURGICAL SUPPLY — 60 items
ADH SKN CLS APL DERMABOND .7 (GAUZE/BANDAGES/DRESSINGS) ×1
APL PRP STRL LF DISP 70% ISPRP (MISCELLANEOUS) ×1
BAG INFUSER PRESSURE 100CC (MISCELLANEOUS) IMPLANT
BAG SPEC RTRVL LRG 6X4 10 (ENDOMECHANICALS) ×1
CANNULA REDUC XI 12-8 STAPL (CANNULA)
CANNULA REDUCER 12-8 DVNC XI (CANNULA) ×1 IMPLANT
CHLORAPREP W/TINT 26 (MISCELLANEOUS) ×2 IMPLANT
CLIP LIGATING HEMO O LOK GREEN (MISCELLANEOUS) ×2 IMPLANT
CUP MEDICINE 2OZ PLAST GRAD ST (MISCELLANEOUS) ×1 IMPLANT
DECANTER SPIKE VIAL GLASS SM (MISCELLANEOUS) ×1 IMPLANT
DEFOGGER SCOPE WARMER CLEARIFY (MISCELLANEOUS) ×2 IMPLANT
DERMABOND ADVANCED (GAUZE/BANDAGES/DRESSINGS) ×1
DERMABOND ADVANCED .7 DNX12 (GAUZE/BANDAGES/DRESSINGS) ×1 IMPLANT
DRAPE ARM DVNC X/XI (DISPOSABLE) ×4 IMPLANT
DRAPE COLUMN DVNC XI (DISPOSABLE) ×1 IMPLANT
DRAPE DA VINCI XI ARM (DISPOSABLE) ×8
DRAPE DA VINCI XI COLUMN (DISPOSABLE) ×2
ELECT CAUTERY BLADE TIP 2.5 (TIP) ×2
ELECT REM PT RETURN 9FT ADLT (ELECTROSURGICAL) ×2
ELECTRODE CAUTERY BLDE TIP 2.5 (TIP) ×1 IMPLANT
ELECTRODE REM PT RTRN 9FT ADLT (ELECTROSURGICAL) ×1 IMPLANT
GAUZE 4X4 16PLY ~~LOC~~+RFID DBL (SPONGE) ×2 IMPLANT
GLOVE SURG SYN 7.0 (GLOVE) ×4 IMPLANT
GLOVE SURG SYN 7.0 PF PI (GLOVE) ×2 IMPLANT
GLOVE SURG SYN 7.5  E (GLOVE) ×4
GLOVE SURG SYN 7.5 E (GLOVE) ×2 IMPLANT
GLOVE SURG SYN 7.5 PF PI (GLOVE) ×2 IMPLANT
GOWN STRL REUS W/ TWL LRG LVL3 (GOWN DISPOSABLE) ×4 IMPLANT
GOWN STRL REUS W/TWL LRG LVL3 (GOWN DISPOSABLE) ×6
IRRIGATOR SUCT 8 DISP DVNC XI (IRRIGATION / IRRIGATOR) IMPLANT
IRRIGATOR SUCTION 8MM XI DISP (IRRIGATION / IRRIGATOR)
IV NS 1000ML (IV SOLUTION)
IV NS 1000ML BAXH (IV SOLUTION) IMPLANT
KIT PINK PAD W/HEAD ARE REST (MISCELLANEOUS) ×2
KIT PINK PAD W/HEAD ARM REST (MISCELLANEOUS) ×1 IMPLANT
LABEL OR SOLS (LABEL) ×2 IMPLANT
MANIFOLD NEPTUNE II (INSTRUMENTS) ×2 IMPLANT
NEEDLE HYPO 22GX1.5 SAFETY (NEEDLE) ×2 IMPLANT
NS IRRIG 500ML POUR BTL (IV SOLUTION) ×2 IMPLANT
OBTURATOR OPTICAL STANDARD 8MM (TROCAR) ×2
OBTURATOR OPTICAL STND 8 DVNC (TROCAR) ×1
OBTURATOR OPTICALSTD 8 DVNC (TROCAR) ×1 IMPLANT
PACK LAP CHOLECYSTECTOMY (MISCELLANEOUS) ×2 IMPLANT
PENCIL ELECTRO HAND CTR (MISCELLANEOUS) ×2 IMPLANT
POUCH SPECIMEN RETRIEVAL 10MM (ENDOMECHANICALS) ×2 IMPLANT
SEAL CANN UNIV 5-8 DVNC XI (MISCELLANEOUS) ×3 IMPLANT
SEAL XI 5MM-8MM UNIVERSAL (MISCELLANEOUS) ×8
SET TUBE SMOKE EVAC HIGH FLOW (TUBING) ×2 IMPLANT
SOLUTION ELECTROLUBE (MISCELLANEOUS) ×2 IMPLANT
SPONGE T-LAP 18X18 ~~LOC~~+RFID (SPONGE) IMPLANT
SPONGE T-LAP 4X18 ~~LOC~~+RFID (SPONGE) ×2 IMPLANT
STAPLER CANNULA SEAL DVNC XI (STAPLE) ×1 IMPLANT
STAPLER CANNULA SEAL XI (STAPLE) ×2
SUT MNCRL AB 4-0 PS2 18 (SUTURE) ×2 IMPLANT
SUT VIC AB 3-0 SH 27 (SUTURE)
SUT VIC AB 3-0 SH 27X BRD (SUTURE) IMPLANT
SUT VICRYL 0 AB UR-6 (SUTURE) ×4 IMPLANT
TAPE TRANSPORE STRL 2 31045 (GAUZE/BANDAGES/DRESSINGS) ×1 IMPLANT
TROCAR BALLN GELPORT 12X130M (ENDOMECHANICALS) ×2 IMPLANT
WATER STERILE IRR 500ML POUR (IV SOLUTION) ×1 IMPLANT

## 2021-06-06 NOTE — Op Note (Signed)
  Procedure Date:  06/06/2021  Pre-operative Diagnosis:  Symptomatic cholelithiasis  Post-operative Diagnosis:  Symptomatic cholelithiasis  Procedure:  Robotic assisted cholecystectomy with ICG FireFly cholangiogram  Surgeon:  Howie Ill, MD  Assistant:  Margarita Rana, PA-S  Anesthesia:  General endotracheal  Estimated Blood Loss:  5 ml  Specimens:  gallbladder  Complications:  None  Indications for Procedure:  This is a 35 y.o. female who presents with abdominal pain and workup revealing symptomatic cholelithiasis.  She also has a recurrent incisional ventral hernia, and our plan is to do cholecystectomy first, and then ventral hernia repair in the future once she's recovered.  The benefits, complications, treatment options, and expected outcomes were discussed with the patient. The risks of bleeding, infection, recurrence of symptoms, failure to resolve symptoms, bile duct damage, bile duct leak, retained common bile duct stone, bowel injury, and need for further procedures were all discussed with the patient and she was willing to proceed.  Description of Procedure: The patient was correctly identified in the preoperative area and brought into the operating room.  The patient was placed supine with VTE prophylaxis in place.  Appropriate time-outs were performed.  Anesthesia was induced and the patient was intubated.  Appropriate antibiotics were infused.  The abdomen was prepped and draped in a sterile fashion. A supraumbilical incision was made. A cutdown technique was used to enter the abdominal cavity without injury, and a 12 mm Hasson port was inserted.  Pneumoperitoneum was obtained with appropriate opening pressures.  Three 8-mm ports were placed in the mid abdomen at the level of the umbilicus under direct visualization.  An 8 mm port was placed through the 12 mm Hasson port.  The DaVinci platform was docked, camera targeted, and instruments were placed under direct  visualization.  There was some omental adhesions near the umbilicus which were taken down bluntly.  The gallbladder was identified.  The fundus was grasped and retracted cephalad.  Adhesions were lysed bluntly and with electrocautery. The infundibulum was grasped and retracted laterally, exposing the peritoneum overlying the gallbladder.  This was incised with electrocautery and extended on either side of the gallbladder.  FireFly cholangiogram was then obtained, and we were able to clearly identify the cystic duct and common bile duct.  The cystic duct and cystic artery were carefully dissected with combination of cautery and blunt dissection.  Both were clipped twice proximally and once distally, cutting in between.  The gallbladder was taken from the gallbladder fossa in a retrograde fashion with electrocautery. The gallbladder was placed in an Endocatch bag. The liver bed was inspected and any bleeding was controlled with electrocautery. The right upper quadrant was then inspected again revealing intact clips, no bleeding, and no ductal injury.  The 8 mm ports were removed under direct visualization and the 12 mm port was removed.  The Endocatch bag was brought out via the umbilical incision. The fascial opening was closed using 0 vicryl suture.  Local anesthetic was infused in all incisions and the incisions were closed with 4-0 Monocryl.  The wounds were cleaned and sealed with DermaBond.  The patient was emerged from anesthesia and extubated and brought to the recovery room for further management.  The patient tolerated the procedure well and all counts were correct at the end of the case.   Howie Ill, MD

## 2021-06-06 NOTE — Interval H&P Note (Signed)
History and Physical Interval Note:  06/06/2021 12:51 PM  [name]  has presented today for surgery, with the diagnosis of symptomatic cholelithiasis.  The various methods of treatment have been discussed with the patient and family. After consideration of risks, benefits and other options for treatment, the patient has consented to  Procedure(s): XI ROBOTIC ASSISTED LAPAROSCOPIC CHOLECYSTECTOMY (N/A) INDOCYANINE GREEN FLUORESCENCE IMAGING (ICG) (N/A) as a surgical intervention.  The patient's history has been reviewed, patient examined, no change in status, stable for surgery.  I have reviewed the patient's chart and labs.  Questions were answered to the patient's satisfaction.     [name]

## 2021-06-06 NOTE — Anesthesia Preprocedure Evaluation (Addendum)
Anesthesia Evaluation  Patient identified by MRN, date of birth, ID band Patient awake    Reviewed: Allergy & Precautions, NPO status , Patient's Chart, lab work & pertinent test results  History of Anesthesia Complications Negative for: history of anesthetic complications  Airway Mallampati: III   Neck ROM: Full    Dental no notable dental hx.    Pulmonary Current Smoker (1 ppd) and Patient abstained from smoking.,    Pulmonary exam normal breath sounds clear to auscultation       Cardiovascular Exercise Tolerance: Good negative cardio ROS Normal cardiovascular exam Rhythm:Regular Rate:Normal     Neuro/Psych  Headaches, PSYCHIATRIC DISORDERS Anxiety Depression Bipolar Disorder Marijuana use    GI/Hepatic Symptomatic cholelithiasis   Endo/Other  Obesity   Renal/GU negative Renal ROS     Musculoskeletal   Abdominal   Peds  Hematology  (+) Blood dyscrasia, anemia ,   Anesthesia Other Findings   Reproductive/Obstetrics                            Anesthesia Physical Anesthesia Plan  ASA: 2  Anesthesia Plan: General   Post-op Pain Management:    Induction: Intravenous  PONV Risk Score and Plan: 2 and Ondansetron, Dexamethasone and Treatment may vary due to age or medical condition  Airway Management Planned: Oral ETT  Additional Equipment:   Intra-op Plan:   Post-operative Plan: Extubation in OR  Informed Consent: I have reviewed the patients History and Physical, chart, labs and discussed the procedure including the risks, benefits and alternatives for the proposed anesthesia with the patient or authorized representative who has indicated his/her understanding and acceptance.     Dental advisory given  Plan Discussed with: CRNA  Anesthesia Plan Comments: (Patient consented for risks of anesthesia including but not limited to:  - adverse reactions to medications - damage  to eyes, teeth, lips or other oral mucosa - nerve damage due to positioning  - sore throat or hoarseness - damage to heart, brain, nerves, lungs, other parts of body or loss of life  Patient voiced understanding.)        Anesthesia Quick Evaluation

## 2021-06-06 NOTE — Transfer of Care (Signed)
Immediate Anesthesia Transfer of Care Note  Patient: [name]  Procedure(s) Performed: XI ROBOTIC ASSISTED LAPAROSCOPIC CHOLECYSTECTOMY (Abdomen) INDOCYANINE GREEN FLUORESCENCE IMAGING (ICG) (Abdomen)  Patient Location: PACU  Anesthesia Type:General  Level of Consciousness: drowsy and patient cooperative  Airway & Oxygen Therapy: Patient Spontanous Breathing and Patient connected to nasal cannula oxygen  Post-op Assessment: Report given to RN and Post -op Vital signs reviewed and stable  Post vital signs: Reviewed and stable  Last Vitals:  Vitals Value Taken Time  BP 120/82 06/06/21 1519  Temp    Pulse 81 06/06/21 1519  Resp 18 06/06/21 1519  SpO2 100 % 06/06/21 1519  Vitals shown include unvalidated device data.  Last Pain:  Vitals:   06/06/21 1144  TempSrc: Oral  PainSc: 4          Complications: No notable events documented.

## 2021-06-06 NOTE — Anesthesia Procedure Notes (Signed)
Procedure Name: Intubation Date/Time: 06/06/2021 1:27 PM Performed by: Omer Jack, CRNA Pre-anesthesia Checklist: Patient identified, Patient being monitored, Timeout performed, Emergency Drugs available and Suction available Patient Re-evaluated:Patient Re-evaluated prior to induction Oxygen Delivery Method: Circle system utilized Preoxygenation: Pre-oxygenation with 100% oxygen Induction Type: IV induction Ventilation: Mask ventilation without difficulty Laryngoscope Size: Miller and 2 Grade View: Grade I Tube type: Oral Tube size: 6.5 mm Number of attempts: 1 Airway Equipment and Method: Stylet Placement Confirmation: ETT inserted through vocal cords under direct vision, positive ETCO2 and breath sounds checked- equal and bilateral Secured at: 20 cm Tube secured with: Tape Dental Injury: Teeth and Oropharynx as per pre-operative assessment

## 2021-06-06 NOTE — Discharge Instructions (Signed)

## 2021-06-08 LAB — SURGICAL PATHOLOGY

## 2021-06-08 NOTE — Anesthesia Postprocedure Evaluation (Signed)
Anesthesia Post Note  Patient: [name]  Procedure(s) Performed: XI ROBOTIC ASSISTED LAPAROSCOPIC CHOLECYSTECTOMY (Abdomen) INDOCYANINE GREEN FLUORESCENCE IMAGING (ICG) (Abdomen)  Patient location during evaluation: PACU Anesthesia Type: General Level of consciousness: awake and alert Pain management: pain level controlled Vital Signs Assessment: post-procedure vital signs reviewed and stable Respiratory status: spontaneous breathing, nonlabored ventilation, respiratory function stable and patient connected to nasal cannula oxygen Cardiovascular status: blood pressure returned to baseline and stable Postop Assessment: no apparent nausea or vomiting Anesthetic complications: no   No notable events documented.   Last Vitals:  Vitals:   06/06/21 1656 06/06/21 1705  BP:  103/84  Pulse: 84 81  Resp: 15 16  Temp: (!) 36.1 C 36.6 C  SpO2: 100% 97%    Last Pain:  Vitals:   06/07/21 0807  TempSrc:   PainSc: 8                  Lenard Simmer

## 2021-06-11 ENCOUNTER — Encounter: Payer: Self-pay | Admitting: Surgery

## 2021-06-14 ENCOUNTER — Telehealth: Payer: Self-pay

## 2021-06-14 ENCOUNTER — Other Ambulatory Visit: Payer: Self-pay | Admitting: Surgery

## 2021-06-14 NOTE — Telephone Encounter (Signed)
Pt called and stated that she is having bad pain in the area her gallbladder was, she is having swelling in her abdomen and both legs and feet. She stated that it looks like she is 9 months pregnant. Pt has a lap choley on 06/06/21.  I advised pt to go get evaluated at the ED for the swelling as it can be a sign of issues with the heart. Pt is currently at work. Dr. Aleen Campi is out of the office today and he has no availability on 06/15/2021. Pt verbalized understanding.

## 2021-06-15 ENCOUNTER — Emergency Department (HOSPITAL_BASED_OUTPATIENT_CLINIC_OR_DEPARTMENT_OTHER): Payer: Medicaid Other

## 2021-06-15 ENCOUNTER — Other Ambulatory Visit: Payer: Self-pay

## 2021-06-15 ENCOUNTER — Emergency Department (HOSPITAL_BASED_OUTPATIENT_CLINIC_OR_DEPARTMENT_OTHER)
Admission: EM | Admit: 2021-06-15 | Discharge: 2021-06-15 | Disposition: A | Discharge status: Home / Self Care | Payer: Medicaid Other | Attending: Emergency Medicine | Admitting: Emergency Medicine

## 2021-06-15 ENCOUNTER — Encounter (HOSPITAL_BASED_OUTPATIENT_CLINIC_OR_DEPARTMENT_OTHER): Payer: Self-pay | Admitting: Urology

## 2021-06-15 DIAGNOSIS — R0789 Other chest pain: Secondary | ICD-10-CM | POA: Diagnosis present

## 2021-06-15 DIAGNOSIS — F17210 Nicotine dependence, cigarettes, uncomplicated: Secondary | ICD-10-CM | POA: Insufficient documentation

## 2021-06-15 DIAGNOSIS — R202 Paresthesia of skin: Secondary | ICD-10-CM | POA: Insufficient documentation

## 2021-06-15 DIAGNOSIS — Z91040 Latex allergy status: Secondary | ICD-10-CM | POA: Diagnosis not present

## 2021-06-15 DIAGNOSIS — R079 Chest pain, unspecified: Secondary | ICD-10-CM

## 2021-06-15 DIAGNOSIS — R109 Unspecified abdominal pain: Secondary | ICD-10-CM | POA: Insufficient documentation

## 2021-06-15 DIAGNOSIS — D72829 Elevated white blood cell count, unspecified: Secondary | ICD-10-CM | POA: Insufficient documentation

## 2021-06-15 HISTORY — DX: Chest pain, unspecified: R07.9

## 2021-06-15 LAB — BASIC METABOLIC PANEL
Anion gap: 10 (ref 5–15)
BUN: 12 mg/dL (ref 6–20)
CO2: 25 mmol/L (ref 22–32)
Calcium: 9.3 mg/dL (ref 8.9–10.3)
Chloride: 104 mmol/L (ref 98–111)
Creatinine, Ser: 0.9 mg/dL (ref 0.44–1.00)
GFR, Estimated: >60 mL/min (ref >60)
Glucose, Bld: 85 mg/dL (ref 70–99)
Potassium: 3.8 mmol/L (ref 3.5–5.1)
Sodium: 139 mmol/L (ref 135–145)

## 2021-06-15 LAB — CBC
HCT: 40.2 % (ref 36.0–46.0)
Hemoglobin: 13.2 g/dL (ref 12.0–15.0)
MCH: 29.4 pg (ref 26.0–34.0)
MCHC: 32.8 g/dL (ref 30.0–36.0)
MCV: 89.5 fL (ref 80.0–100.0)
Platelets: 410 10*3/uL — ABNORMAL HIGH (ref 150–400)
RBC: 4.49 MIL/uL (ref 3.87–5.11)
RDW: 13.6 % (ref 11.5–15.5)
WBC: 17 10*3/uL — ABNORMAL HIGH (ref 4.0–10.5)
nRBC: 0 % (ref 0.0–0.2)

## 2021-06-15 LAB — HEPATIC FUNCTION PANEL
ALT: 14 U/L (ref 0–44)
AST: 15 U/L (ref 15–41)
Albumin: 4 g/dL (ref 3.5–5.0)
Alkaline Phosphatase: 75 U/L (ref 38–126)
Bilirubin, Direct: <0.1 mg/dL (ref 0.0–0.2)
Total Bilirubin: 0.2 mg/dL — ABNORMAL LOW (ref 0.3–1.2)
Total Protein: 7.9 g/dL (ref 6.5–8.1)

## 2021-06-15 LAB — D-DIMER, QUANTITATIVE: D-Dimer, Quant: 0.66 ug/mL-FEU — ABNORMAL HIGH (ref 0.00–0.50)

## 2021-06-15 LAB — PREGNANCY, URINE: Preg Test, Ur: NEGATIVE

## 2021-06-15 LAB — TROPONIN I (HIGH SENSITIVITY): Troponin I (High Sensitivity): <2 ng/L (ref <18)

## 2021-06-15 LAB — LIPASE, BLOOD: Lipase: 100 U/L — ABNORMAL HIGH (ref 11–51)

## 2021-06-15 MED ORDER — IOHEXOL 350 MG/ML SOLN
100.0000 mL | Freq: Once | INTRAVENOUS | Status: AC | PRN
  Administered 2021-06-15: 100 mL via INTRAVENOUS

## 2021-06-15 NOTE — ED Triage Notes (Signed)
Pt reports mid/left sided chest pain since 11/2 after gallbladder surgery.  Pt states left arm heaviness. States tingling in left hand.  Reports confusion that started last night.  States "I am not thinking correctly".

## 2021-06-15 NOTE — Discharge Instructions (Signed)
Please follow-up with your general surgeon, Dr. Aleen Campi as well as your primary care doctor to discuss the symptoms that you are having today.  If you develop worsening chest pain, any abdominal pain, any fever, vomiting or other new concerning symptom, please return to the ER for reassessment.

## 2021-06-15 NOTE — ED Notes (Signed)
Pt transported to xray 

## 2021-06-15 NOTE — ED Notes (Signed)
Patient discharged to home.  All discharge instructions reviewed.  Patient verbalized understanding via teachback method.  VS WDL.  Respirations even and unlabored.  Ambulatory out of ED.   °

## 2021-06-15 NOTE — ED Notes (Signed)
Lab notified to add-on hepatic function panel and lipase to previously collected lab work.
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
## 2021-06-15 NOTE — ED Notes (Signed)
Received care of patient resting on stretcher.  0 s/s acute distress.  Bed in lowest position with side rails up x2.  Call bell in reach.   ED provider to bedside.

## 2021-06-18 ENCOUNTER — Other Ambulatory Visit: Payer: Self-pay | Admitting: Internal Medicine

## 2021-06-18 DIAGNOSIS — F411 Generalized anxiety disorder: Secondary | ICD-10-CM

## 2021-06-18 NOTE — Telephone Encounter (Signed)
Requested medication (s) are due for refill today - no  Requested medication (s) are on the active medication list -no  Future visit scheduled -no  Last refill: 01/24/21  Notes to clinic: Request RF: medication no longer current on medication list  Requested Prescriptions  Pending Prescriptions Disp Refills   hydrOXYzine (ATARAX/VISTARIL) 10 MG tablet [Pharmacy Med Name: HYDROXYZINE HCL 10MG  TABLETS] 60 tablet 0    Sig: TAKE 1 TABLET(10 MG) BY MOUTH THREE TIMES DAILY AS NEEDED FOR ANXIETY     Ear, Nose, and Throat:  Antihistamines Passed - 06/18/2021  3:19 AM      Passed - Valid encounter within last 12 months    Recent Outpatient Visits           5 months ago Encounter for examination following treatment at hospital   [facility] [place], [place], Marzella Schlein   1 year ago No-show for appointment   [facility] [place], [place], [place]   2 years ago Dysfunctional uterine bleeding   [facility], MD   3 years ago Anxiety and depression   [facility] Marcine Matar, MD   3 years ago Genital warts   [facility] Marcine Matar, MD                 Requested Prescriptions  Pending Prescriptions Disp Refills   hydrOXYzine (ATARAX/VISTARIL) 10 MG tablet [Pharmacy Med Name: HYDROXYZINE HCL 10MG  TABLETS] 60 tablet 0    Sig: TAKE 1 TABLET(10 MG) BY MOUTH THREE TIMES DAILY AS NEEDED FOR ANXIETY     Ear, Nose, and Throat:  Antihistamines Passed - 06/18/2021  3:19 AM      Passed - Valid encounter within last 12 months    Recent Outpatient Visits           5 months ago Encounter for examination following treatment at hospital   [facility] [place], [facility], [place]   1 year ago No-show for appointment   [facility] [place], [facility], NP   2 years ago  Dysfunctional uterine bleeding   [facility] [place], MD   3 years ago Anxiety and depression   [facility], MD   3 years ago Genital warts   [facility] Marcine Matar, MD

## 2021-06-22 ENCOUNTER — Other Ambulatory Visit: Payer: Self-pay

## 2021-06-22 ENCOUNTER — Encounter: Payer: Self-pay | Admitting: Surgery

## 2021-06-22 ENCOUNTER — Ambulatory Visit (INDEPENDENT_AMBULATORY_CARE_PROVIDER_SITE_OTHER): Payer: Medicaid Other | Admitting: Surgery

## 2021-06-22 VITALS — BP 121/81 | HR 94 | Temp 99.4°F | Ht 59.0 in | Wt 155.6 lb

## 2021-06-22 DIAGNOSIS — Z09 Encounter for follow-up examination after completed treatment for conditions other than malignant neoplasm: Secondary | ICD-10-CM

## 2021-06-22 DIAGNOSIS — K8020 Calculus of gallbladder without cholecystitis without obstruction: Secondary | ICD-10-CM

## 2021-06-22 NOTE — Progress Notes (Signed)
06/22/2021  HPI: [name] is a 35 y.o. female s/p robotic assisted cholecystectomy on 06/06/21.  She presents today for follow up.  Patient had a visit in the ED on 06/15/21 due to chest pain.  She had CTA chest which was negative, and CT abd/pelvis which showed a small collection at the gallbladder fossa, which I think is likely small seroma after surgery.  She has seen cardiology since then and she's getting workup for her chest pain associated with dizziness, lower blood pressure and tachycardia.  She's had an echo and is going to have a stress test.    From gallbladder standpoint, the incisions are healing well and denies any significant pain, except at the umbilical area which is also near her existing ventral hernias.  She also reports epigastric pain and has a history of gastritis.  Vital signs: BP 121/81   Pulse 94   Temp 99.4 F (37.4 C) (Oral)   Ht 4\' 11"  (1.499 m)   Wt 155 lb 9.6 oz (70.6 kg)   SpO2 98%   BMI 31.43 kg/m    Physical Exam: Constitutional: No acute distress Abdomen:  soft, non-distended, appropriately tender to palpation.  Incisions are clean, dry, intact.  Patient's ventral hernia is stable.  Assessment/Plan: This is a 35 y.o. female s/p robotic assisted cholecystectomy.  --Discussed with the patient that I would highly recommend continuing with the cardiology workup.  Our next planned surgery would be a robotic ventral hernia repair, but I would want to make sure she's cleared from the cardiology standpoint prior to stressing her again with further surgery and abdominal insufflation.   --From the gallbladder standpoint, I do not think there's any left over gallbladder as the surgery went without issues.  I think there's likely a small seroma as her CT was just 9 days after surgery. --She will follow up in 1 month to assess her progress and workup prior to scheduling any surgery.  She understands that this follow up may need to be postponed if there's still  workup pending.   34, MD [facility]

## 2021-06-22 NOTE — Patient Instructions (Addendum)
You could try Prilosec OTC once daily for 2 weeks to see if this improves your symptoms.  You may use Benadryl ointment for the itching.   We will see you back in 1 month.   GENERAL POST-OPERATIVE PATIENT INSTRUCTIONS   WOUND CARE INSTRUCTIONS: Try to keep the wound dry and avoid ointments on the wound unless directed to do so.  If the wound becomes bright red and painful or starts to drain infected material that is not clear, please contact your physician immediately.  If the wound is mildly pink and has a thick firm ridge underneath it, this is normal, and is referred to as a healing ridge.  This will resolve over the next 4-6 weeks.  BATHING: You may shower if you have been informed of this by your surgeon. However, Please do not submerge in a tub, hot tub, or pool until incisions are completely sealed or have been told by your surgeon that you may do so.  DIET:  You may eat any foods that you can tolerate.  It is a good idea to eat a high fiber diet and take in plenty of fluids to prevent constipation.  If you do become constipated you may want to take a mild laxative or take ducolax tablets on a daily basis until your bowel habits are regular.  Constipation can be very uncomfortable, along with straining, after recent surgery.  ACTIVITY:  You may want to hug a pillow when coughing and sneezing to add additional support to the surgical area, if you had abdominal or chest surgery, which will decrease pain during these times. You are encouraged to walk and engage in light activity for the next two weeks.  You should not lift more than 20 pounds for 6 weeks after surgery as it could put you at increased risk for complications.  Twenty pounds is roughly equivalent to a plastic bag of groceries. At that time- Listen to your body when lifting, if you have pain when lifting, stop and then try again in a few days. Soreness after doing exercises or activities of daily living is normal as you get back in to  your normal routine.  MEDICATIONS:  Try to take narcotic medications and anti-inflammatory medications, such as tylenol, ibuprofen, naprosyn, etc., with food.  This will minimize stomach upset from the medication.  Should you develop nausea and vomiting from the pain medication, or develop a rash, please discontinue the medication and contact your physician.  You should not drive, make important decisions, or operate machinery when taking narcotic pain medication.  SUNBLOCK Use sun block to incision area over the next year if this area will be exposed to sun. This helps decrease scarring and will allow you avoid a permanent darkened area over your incision.  QUESTIONS:  Please feel free to call our office if you have any questions, and we will be glad to assist you.

## 2021-07-20 ENCOUNTER — Encounter: Payer: Medicaid Other | Admitting: Surgery

## 2021-08-17 ENCOUNTER — Telehealth: Payer: Self-pay

## 2021-08-17 ENCOUNTER — Ambulatory Visit (INDEPENDENT_AMBULATORY_CARE_PROVIDER_SITE_OTHER): Payer: Medicaid Other | Admitting: Surgery

## 2021-08-17 ENCOUNTER — Other Ambulatory Visit: Payer: Self-pay

## 2021-08-17 ENCOUNTER — Encounter: Payer: Self-pay | Admitting: Surgery

## 2021-08-17 VITALS — BP 111/76 | HR 83 | Temp 98.3°F | Ht 59.0 in | Wt 162.8 lb

## 2021-08-17 DIAGNOSIS — K432 Incisional hernia without obstruction or gangrene: Secondary | ICD-10-CM

## 2021-08-17 NOTE — Telephone Encounter (Signed)
Cardiac Clearance faxed to Dr.John Mercy Riding at [facility] number [phone].

## 2021-08-17 NOTE — H&P (View-Only) (Signed)
°08/17/2021 ° °History of Present Illness: °[name] is a 36 y.o. female presenting for follow up of a recurrent incisional hernia.  The patient is s/p open incisional ventral hernia repair on 11/03/2017.  This was done without mesh as there was inflamed omentum in the hernia sac.  Her hernia has recurred, and concurrently, was also having biliary colic issues.  She underwent robotic cholecystectomy on 06/06/21, and now presents for management of her recurrent incisional hernia.  The patient is doing well from gallbladder standpoint, though reports that she's having to eat smaller meals at a time as she feels full early.  Denies any nausea or vomiting.  From the hernia standpoint, she continues endorsing bulging and pain sensation at the umbilical area and inferior to it.  Denies any worsening pain or feeling the hernia is larger. ° °She had a visit in the ED on 06/15/21 for chest pain and dizziness and has seen cardiology for this and is scheduled to have a stress test on 08/27/21.  She reports that she's taking a medication to help with her lower blood pressure and denies having any significant dizziness and no chest pain since.   ° °Past Medical History: °Past Medical History:  °Diagnosis Date  ° Anemia   ° Anxiety 11/21/2011  ° Blood transfusion complicating pregnancy   ° Cervical lymphadenopathy 04/29/2017  ° Complication of anesthesia   ° Swelling from some drug used for anesthesia. Does not  know name.  ° Depression   ° Genital warts   ° History of methicillin resistant staphylococcus aureus (MRSA) 2017  ° HPV test positive   ° RhD negative   °  ° °Past Surgical History: °Past Surgical History:  °Procedure Laterality Date  ° CESAREAN SECTION N/A 07/03/2015  ° Procedure: CESAREAN SECTION;  Surgeon: Jacob J Stinson, DO;  Location: [facility];  Service: Obstetrics;  Laterality: N/A;  ° CHOLECYSTECTOMY    ° DILATION AND CURETTAGE OF UTERUS    ° FRACTURE SURGERY Right   ° pinky finger  ° fractured finger  08/06/1999   ° open reduction  ° HYSTEROSCOPY WITH D & C N/A 01/13/2019  ° Procedure: DILATATION AND CURETTAGE WITH MINERVA ABLATION;  Surgeon: Dove, Myra C, MD;  Location: [facility];  Service: Gynecology;  Laterality: N/A;  Rep will be here confirmed 01/08/2019 CS  ° LAPAROSCOPY N/A 01/13/2019  ° Procedure: LAPAROSCOPY DIAGNOSTIC, Lysis of Adhesions, Biopsy Posterior Cul de Sac;  Surgeon: Dove, Myra C, MD;  Location: [facility];  Service: Gynecology;  Laterality: N/A;  ° LESION REMOVAL N/A 01/13/2019  ° Procedure: EXCISION OF Labial Warts;  Surgeon: Dove, Myra C, MD;  Location: [facility];  Service: Gynecology;  Laterality: N/A;  ° THERAPEUTIC ABORTION    ° TUBAL LIGATION    ° UPPER GI ENDOSCOPY    ° VENTRAL HERNIA REPAIR N/A 11/03/2017  ° Procedure: HERNIA REPAIR VENTRAL ADULT;  Surgeon: Tra Wilemon, MD;  Location: [facility];  Service: General;  Laterality: N/A;  ° ° °Home Medications: °Prior to Admission medications   °Medication Sig Start Date End Date Taking? Authorizing Provider  °acetaminophen (TYLENOL) 500 MG tablet Take 2 tablets (1,000 mg total) by mouth every 6 (six) hours as needed for mild pain. 06/06/21  Yes Monteen Toops, MD  °acyclovir (ZOVIRAX) 400 MG tablet Take 400 mg by mouth daily. 05/21/21  Yes [provider]  °ALPRAZolam (XANAX) 0.5 MG tablet Take 0.5 mg by mouth 2 (two) times daily.     Yes [provider]  amphetamine-dextroamphetamine (ADDERALL) 15 MG tablet Take 1 tablet by mouth in the morning and at bedtime. 05/18/21  Yes [provider]  atorvastatin (LIPITOR) 10 MG tablet Take 10 mg by mouth at bedtime. 05/21/21  Yes [provider]  cariprazine (VRAYLAR) capsule Take 1 capsule (1.5 mg total) by mouth daily. 10/19/20  Yes Mayers, Cari S, PA-C  cloNIDine HCl (KAPVAY) 0.1 MG TB12 ER tablet Take 0.1 mg by mouth 2 (two) times daily. 05/18/21  Yes [provider]  DULoxetine (CYMBALTA) 60 MG capsule Take 60 mg  by mouth daily. 05/18/21  Yes [provider]  hydrOXYzine (ATARAX/VISTARIL) 25 MG tablet Take 50 mg by mouth at bedtime.   Yes [provider]  ibuprofen (ADVIL) 600 MG tablet Take 1 tablet (600 mg total) by mouth every 8 (eight) hours as needed for moderate pain. 06/06/21  Yes Karmella Bouvier, Elita Quick, MD  omeprazole (PRILOSEC) 40 MG capsule Take 40 mg by mouth daily.   Yes [provider]  ondansetron (ZOFRAN) 8 MG tablet Take by mouth every 8 (eight) hours as needed for nausea or vomiting.   Yes [provider]  Vitamin D, Ergocalciferol, (DRISDOL) 1.25 MG (50000 UNIT) CAPS capsule Take 50,000 Units by mouth every Sunday. 05/21/21  Yes [provider]    Allergies: Allergies  Allergen Reactions   Other Anaphylaxis    Childhood reaction, finger surgery, total body swelling to anesthesia  medication   Penicillins Rash    Swells    Latex Itching and Swelling    Review of Systems: Review of Systems  Constitutional:  Negative for chills and fever.  HENT:  Negative for hearing loss.   Respiratory:  Negative for shortness of breath.   Cardiovascular:  Negative for chest pain.  Gastrointestinal:  Positive for abdominal pain. Negative for constipation, diarrhea, nausea and vomiting.  Genitourinary:  Negative for dysuria.  Musculoskeletal:  Negative for myalgias.  Skin:  Negative for rash.  Neurological:  Negative for dizziness.  Psychiatric/Behavioral:  Negative for depression.    Physical Exam BP 111/76    Pulse 83    Temp 98.3 F (36.8 C) (Oral)    Ht 4\' 11"  (1.499 m)    Wt 162 lb 12.8 oz (73.8 kg)    SpO2 98%    BMI 32.88 kg/m  CONSTITUTIONAL: No acute distress, well nourished. HEENT:  Normocephalic, atraumatic, extraocular motion intact. NECK:  Trachea is midline, no jugular venous distention. RESPIRATORY:  Lungs are clear, and breath sounds are equal bilaterally. Normal respiratory effort without pathologic use of accessory  muscles. CARDIOVASCULAR: Heart is regular without murmurs, gallops, or rubs. GI: The abdomen is soft, non-distended, with localized tenderness just inferior to the umbilicus at the site of her main recurrent hernia.  The hernia is partially reducible, without evidence of strangulation. Prior cholecystectomy incisions are well healed. MUSCULOSKELETAL:  Normal gait, no peripheral edema. NEUROLOGIC:  Motor and sensation is grossly normal.  Cranial nerves are grossly intact. PSYCH:  Alert and oriented to person, place and time. Affect is normal.  Labs/Imaging: None new  Assessment and Plan: This is a 36 y.o. female with a recurrent incisional hernia.  --Discussed with the patient that now that she's recovered from her cholecystectomy, we can proceed with her hernia repair.  Discussed with her the plan for a robotic assisted recurrent incisional hernia repair.  Reviewed the surgery at length with her including the risks of bleeding, infection, injury to surrounding structures, that this is an  outpatient surgery, the use of an abdominal binder post-op, pain control, and she's willing to proceed. --Will send for cardiology clearance given her upcoming stress test. --Will tentatively schedule her for 09/06/21.  I spent 40 minutes dedicated to the care of this patient on the date of this encounter to include pre-visit review of records, face-to-face time with the patient discussing diagnosis and management, and any post-visit coordination of care.   Howie Ill, MD [facility]

## 2021-08-17 NOTE — Progress Notes (Signed)
08/17/2021  History of Present Illness: [name] is a 36 y.o. female presenting for follow up of a recurrent incisional hernia.  The patient is s/p open incisional ventral hernia repair on 11/03/2017.  This was done without mesh as there was inflamed omentum in the hernia sac.  Her hernia has recurred, and concurrently, was also having biliary colic issues.  She underwent robotic cholecystectomy on 06/06/21, and now presents for management of her recurrent incisional hernia.  The patient is doing well from gallbladder standpoint, though reports that she's having to eat smaller meals at a time as she feels full early.  Denies any nausea or vomiting.  From the hernia standpoint, she continues endorsing bulging and pain sensation at the umbilical area and inferior to it.  Denies any worsening pain or feeling the hernia is larger.  She had a visit in the ED on 06/15/21 for chest pain and dizziness and has seen cardiology for this and is scheduled to have a stress test on 08/27/21.  She reports that she's taking a medication to help with her lower blood pressure and denies having any significant dizziness and no chest pain since.    Past Medical History: Past Medical History:  Diagnosis Date   Anemia    Anxiety 11/21/2011   Blood transfusion complicating pregnancy    Cervical lymphadenopathy 04/29/2017   Complication of anesthesia    Swelling from some drug used for anesthesia. Does not  know name.   Depression    Genital warts    History of methicillin resistant staphylococcus aureus (MRSA) 2017   HPV test positive    RhD negative      Past Surgical History: Past Surgical History:  Procedure Laterality Date   CESAREAN SECTION N/A 07/03/2015   Procedure: CESAREAN SECTION;  Surgeon: Levie HeritageJacob J Stinson, DO;  Location: [facility];  Service: Obstetrics;  Laterality: N/A;   CHOLECYSTECTOMY     DILATION AND CURETTAGE OF UTERUS     FRACTURE SURGERY Right    pinky finger   fractured finger  08/06/1999    open reduction   HYSTEROSCOPY WITH D & C N/A 01/13/2019   Procedure: DILATATION AND CURETTAGE WITH MINERVA ABLATION;  Surgeon: Allie Bossierove, Myra C, MD;  Location: [facility];  Service: Gynecology;  Laterality: N/A;  Rep will be here confirmed 01/08/2019 CS   LAPAROSCOPY N/A 01/13/2019   Procedure: LAPAROSCOPY DIAGNOSTIC, Lysis of Adhesions, Biopsy Posterior Cul de Sac;  Surgeon: Allie Bossierove, Myra C, MD;  Location: [facility];  Service: Gynecology;  Laterality: N/A;   LESION REMOVAL N/A 01/13/2019   Procedure: EXCISION OF Labial Warts;  Surgeon: Allie Bossierove, Myra C, MD;  Location: [facility];  Service: Gynecology;  Laterality: N/A;   THERAPEUTIC ABORTION     TUBAL LIGATION     UPPER GI ENDOSCOPY     VENTRAL HERNIA REPAIR N/A 11/03/2017   Procedure: HERNIA REPAIR VENTRAL ADULT;  Surgeon: Henrene DodgePiscoya, Ameirah Khatoon, MD;  Location: [facility];  Service: General;  Laterality: N/A;    Home Medications: Prior to Admission medications   Medication Sig Start Date End Date Taking? Authorizing Provider  acetaminophen (TYLENOL) 500 MG tablet Take 2 tablets (1,000 mg total) by mouth every 6 (six) hours as needed for mild pain. 06/06/21  Yes Freddrick Gladson, Elita QuickJose, MD  acyclovir (ZOVIRAX) 400 MG tablet Take 400 mg by mouth daily. 05/21/21  Yes [provider]  ALPRAZolam Prudy Feeler(XANAX) 0.5 MG tablet Take 0.5 mg by mouth 2 (two) times daily.  Yes [provider]  amphetamine-dextroamphetamine (ADDERALL) 15 MG tablet Take 1 tablet by mouth in the morning and at bedtime. 05/18/21  Yes [provider]  atorvastatin (LIPITOR) 10 MG tablet Take 10 mg by mouth at bedtime. 05/21/21  Yes [provider]  cariprazine (VRAYLAR) capsule Take 1 capsule (1.5 mg total) by mouth daily. 10/19/20  Yes Mayers, Cari S, PA-C  cloNIDine HCl (KAPVAY) 0.1 MG TB12 ER tablet Take 0.1 mg by mouth 2 (two) times daily. 05/18/21  Yes [provider]  DULoxetine (CYMBALTA) 60 MG capsule Take 60 mg  by mouth daily. 05/18/21  Yes [provider]  hydrOXYzine (ATARAX/VISTARIL) 25 MG tablet Take 50 mg by mouth at bedtime.   Yes [provider]  ibuprofen (ADVIL) 600 MG tablet Take 1 tablet (600 mg total) by mouth every 8 (eight) hours as needed for moderate pain. 06/06/21  Yes Zailah Zagami, Elita Quick, MD  omeprazole (PRILOSEC) 40 MG capsule Take 40 mg by mouth daily.   Yes [provider]  ondansetron (ZOFRAN) 8 MG tablet Take by mouth every 8 (eight) hours as needed for nausea or vomiting.   Yes [provider]  Vitamin D, Ergocalciferol, (DRISDOL) 1.25 MG (50000 UNIT) CAPS capsule Take 50,000 Units by mouth every Sunday. 05/21/21  Yes [provider]    Allergies: Allergies  Allergen Reactions   Other Anaphylaxis    Childhood reaction, finger surgery, total body swelling to anesthesia  medication   Penicillins Rash    Swells    Latex Itching and Swelling    Review of Systems: Review of Systems  Constitutional:  Negative for chills and fever.  HENT:  Negative for hearing loss.   Respiratory:  Negative for shortness of breath.   Cardiovascular:  Negative for chest pain.  Gastrointestinal:  Positive for abdominal pain. Negative for constipation, diarrhea, nausea and vomiting.  Genitourinary:  Negative for dysuria.  Musculoskeletal:  Negative for myalgias.  Skin:  Negative for rash.  Neurological:  Negative for dizziness.  Psychiatric/Behavioral:  Negative for depression.    Physical Exam BP 111/76    Pulse 83    Temp 98.3 F (36.8 C) (Oral)    Ht 4\' 11"  (1.499 m)    Wt 162 lb 12.8 oz (73.8 kg)    SpO2 98%    BMI 32.88 kg/m  CONSTITUTIONAL: No acute distress, well nourished. HEENT:  Normocephalic, atraumatic, extraocular motion intact. NECK:  Trachea is midline, no jugular venous distention. RESPIRATORY:  Lungs are clear, and breath sounds are equal bilaterally. Normal respiratory effort without pathologic use of accessory  muscles. CARDIOVASCULAR: Heart is regular without murmurs, gallops, or rubs. GI: The abdomen is soft, non-distended, with localized tenderness just inferior to the umbilicus at the site of her main recurrent hernia.  The hernia is partially reducible, without evidence of strangulation. Prior cholecystectomy incisions are well healed. MUSCULOSKELETAL:  Normal gait, no peripheral edema. NEUROLOGIC:  Motor and sensation is grossly normal.  Cranial nerves are grossly intact. PSYCH:  Alert and oriented to person, place and time. Affect is normal.  Labs/Imaging: None new  Assessment and Plan: This is a 36 y.o. female with a recurrent incisional hernia.  --Discussed with the patient that now that she's recovered from her cholecystectomy, we can proceed with her hernia repair.  Discussed with her the plan for a robotic assisted recurrent incisional hernia repair.  Reviewed the surgery at length with her including the risks of bleeding, infection, injury to surrounding structures, that this is an  outpatient surgery, the use of an abdominal binder post-op, pain control, and she's willing to proceed. --Will send for cardiology clearance given her upcoming stress test. --Will tentatively schedule her for 09/06/21.  I spent 40 minutes dedicated to the care of this patient on the date of this encounter to include pre-visit review of records, face-to-face time with the patient discussing diagnosis and management, and any post-visit coordination of care.   Howie Ill, MD [facility]

## 2021-08-17 NOTE — Patient Instructions (Addendum)
Our surgery scheduler will call you within 24-48 hours to schedule your surgery. Please have the Blue surgery sheet available when speaking with her. Cardiac Clearance faxed to Dr.John Mercy Riding.   Hernia, Adult   A hernia is the bulging of an organ or tissue through a weak spot in the muscles of the abdomen. Hernias develop most often near the belly button (navel) or the area where the leg meets the lower abdomen (groin). Common types of hernias include: Incisional hernia. This type bulges through a scar from an abdominal surgery. Umbilical hernia. This type develops near the navel. Inguinal hernia. This type develops in the groin or scrotum. Femoral hernia. This type develops below the groin, in the upper thigh area. Hiatal hernia. This type occurs when part of the stomach slides above the muscle that separates the abdomen from the chest (diaphragm). What are the causes? This condition may be caused by: Heavy lifting. Coughing over a long period of time. Straining to have a bowel movement. Constipation can lead to straining. An incision made during abdominal surgery. A physical problem that is present at birth (congenital defect). Being overweight or obese. Smoking. Excess fluid in the abdomen. Undescended testicles in males. What are the signs or symptoms? The main symptom is a skin-colored, rounded bulge in the area of the hernia. However, a bulge may not always be present. It may grow bigger or be more visible when you cough or strain (such as when lifting something heavy). A hernia that can be pushed back into the abdomen (is reducible) rarely causes pain. A hernia that cannot be pushed back into the abdomen (is incarcerated) may lose its blood supply (become strangulated). A hernia that is incarcerated may cause: Pain. Fever. Nausea and vomiting. Swelling. Constipation. How is this diagnosed? A hernia may be diagnosed based on: Your symptoms and medical history. A physical exam.  Your health care provider may ask you to cough or move in certain ways to see if the hernia becomes visible. Imaging tests, such as: X-rays. Ultrasound. CT scan. How is this treated? A hernia that is small and painless may not need to be treated. A hernia that is large or painful may be treated with surgery. Inguinal hernias may be treated with surgery to prevent incarceration or strangulation. Strangulated hernias are always treated with surgery because the strangulation causes a lack of blood supply to the trapped organ or tissue. Surgery to treat a hernia involves pushing the bulge back into place and repairing the weak area of the muscle or abdominal wall. Follow these instructions at home: Activity Avoid straining. Do not lift anything that is heavier than 10 lb (4.5 kg), or the limit that you are told, until your health care provider says that it is safe. When lifting heavy objects, lift with your leg muscles, not your back muscles. Preventing constipation Take actions to prevent constipation. Constipation leads to straining with bowel movements, which can make a hernia worse or cause a hernia repair to break down. Your health care provider may recommend that you take these actions to prevent or treat constipation: Drink enough fluid to keep your urine pale yellow. Take over-the-counter or prescription medicines. Eat foods that are high in fiber, such as beans, whole grains, and fresh fruits and vegetables. Limit foods that are high in fat and processed sugars, such as fried or sweet foods. General instructions When coughing, try to cough gently. You may try to push the hernia back in place by very gently pressing on  it while lying down. Do not try to force the bulge back in if it will not push in easily. If you are overweight, work with your health care provider to lose weight safely. Do not use any products that contain nicotine or tobacco. These products include cigarettes, chewing  tobacco, and vaping devices, such as e-cigarettes. If you need help quitting, ask your health care provider. If you are scheduled for hernia repair, watch your hernia for any changes in shape, size, or color. Tell your health care provider about any changes or new symptoms. Take over-the-counter and prescription medicines only as told by your health care provider. Keep all follow-up visits. This is important. Contact a health care provider if: You develop new pain, swelling, or redness around your hernia. You have signs of constipation, such as: Fewer bowel movements in a week than normal. Difficulty having a bowel movement. Stools that are dry, hard, or larger than normal. Get help right away if: You have a fever or chills. You have abdominal pain that gets worse. You feel nauseous or you vomit. You cannot push the hernia back in place by very gently pressing on it while lying down. Do not try to force the bulge back in if it will not go in easily. The hernia: Changes in shape, size, or color. Feels hard or tender. These symptoms may represent a serious problem that is an emergency. Do not wait to see if the symptoms will go away. Get medical help right away. Call your local emergency services (911 in the [place] Do not drive yourself to the hospital. Summary A hernia is the bulging of an organ or tissue through a weak spot in the muscles of the abdomen. The main symptom is a skin-colored bulge in the hernia area. However, a bulge may not always be present. It may grow bigger or more visible when you cough or strain (such as when having a bowel movement). A hernia that is small and painless may not need to be treated. A hernia that is large or painful may be treated with surgery. Surgery to treat a hernia involves pushing the bulge back into place and repairing the weak part of the abdomen. This information is not intended to replace advice given to you by your health care provider. Make sure  you discuss any questions you have with your health care provider. Document Revised: 02/28/2020 Document Reviewed: 02/28/2020 Elsevier Patient Education  2022 [employer].

## 2021-08-20 ENCOUNTER — Telehealth: Payer: Self-pay | Admitting: Surgery

## 2021-08-20 NOTE — Telephone Encounter (Signed)
Patient has been advised of Pre-Admission date/time, COVID Testing date and Surgery date.  Surgery Date: 09/06/21 Preadmission Testing Date: 08/28/21 (phone 1p-5p) Covid Testing Date: Not needed.   Patient has been made aware to call [phone], between 1-3:00pm the day before surgery, to find out what time to arrive for surgery.

## 2021-08-28 ENCOUNTER — Other Ambulatory Visit
Admission: RE | Admit: 2021-08-28 | Discharge: 2021-08-28 | Disposition: A | Discharge status: Home / Self Care | Payer: Medicaid Other | Source: Ambulatory Visit | Attending: Surgery | Admitting: Surgery

## 2021-08-28 ENCOUNTER — Other Ambulatory Visit: Payer: Self-pay

## 2021-08-28 ENCOUNTER — Telehealth: Payer: Self-pay

## 2021-08-28 HISTORY — DX: Unspecified asthma, uncomplicated: J45.909

## 2021-08-28 NOTE — Pre-Procedure Instructions (Signed)
Pre-op interview completed. Patient stated that she did not have the cardiac stress test done that was scheduled 08/27/21; she forgot about it. Instructed her to call her cardiologist to get that rescheduled as she will need a cardiac clearance prior to surgery (per Dr. Mont Dutton note). Acknowledged understanding and would call Dr. Tonia Ghent (cardiology) at [facility] to get that rescheduled.

## 2021-08-28 NOTE — Telephone Encounter (Signed)
Spoke with  Amy  at [facility] regarding Cardiac Clearance. Patient rescheduled for 08/30/21 @ 8:15 am with Dr.John Mercy Riding.

## 2021-08-28 NOTE — Patient Instructions (Addendum)
Your procedure is scheduled on: Thursday, February 2 Report to the Registration Desk on the 1st floor of the [facility] To find out your arrival time, please call [phone] between 1PM - 3PM on: Wednesday, February 1  REMEMBER: Instructions that are not followed completely may result in serious medical risk, up to and including death; or upon the discretion of your surgeon and anesthesiologist your surgery may need to be rescheduled.  Do not eat food after midnight the night before surgery.  No gum chewing, lozengers or hard candies.  You may however, drink CLEAR liquids up to 2 hours before you are scheduled to arrive for your surgery. Do not drink anything within 2 hours of your scheduled arrival time.  Clear liquids include: - water  - apple juice without pulp - gatorade (not RED, PURPLE, OR BLUE) - black coffee or tea (Do NOT add milk or creamers to the coffee or tea) Do NOT drink anything that is not on this list.  TAKE THESE MEDICATIONS THE MORNING OF SURGERY WITH A SIP OF WATER:  clonidine  One week prior to surgery: starting January 26 Stop Anti-inflammatories (NSAIDS) such as Advil, Aleve, Ibuprofen, Motrin, Naproxen, Naprosyn and Aspirin based products such as Excedrin, Goodys Powder, BC Powder. Stop ANY OVER THE COUNTER supplements until after surgery. You may however, continue to take Tylenol if needed for pain up until the day of surgery.  No Alcohol for 24 hours before or after surgery.  No Smoking including e-cigarettes for 24 hours prior to surgery.  No chewable tobacco products for at least 6 hours prior to surgery.  No nicotine patches on the day of surgery.  Do not use any "recreational" drugs for at least a week prior to your surgery.  Please be advised that the combination of cocaine and anesthesia may have negative outcomes, up to and including death. If you test positive for cocaine, your surgery will be cancelled.  On the morning of surgery brush  your teeth with toothpaste and water, you may rinse your mouth with mouthwash if you wish. Do not swallow any toothpaste or mouthwash.  Use CHG Soap as directed on instruction sheet.  Do not wear jewelry, make-up, hairpins, clips or nail polish.  Do not wear lotions, powders, or perfumes.   Do not shave body from the neck down 48 hours prior to surgery just in case you cut yourself which could leave a site for infection.  Also, freshly shaved skin may become irritated if using the CHG soap.  Contact lenses, hearing aids and dentures may not be worn into surgery.  Do not bring valuables to the hospital. [facility] is not responsible for any missing/lost belongings or valuables.   Notify your doctor if there is any change in your medical condition (cold, fever, infection).  Wear comfortable clothing (specific to your surgery type) to the hospital.  After surgery, you can help prevent lung complications by doing breathing exercises.  Take deep breaths and cough every 1-2 hours. Your doctor may order a device called an Incentive Spirometer to help you take deep breaths. When coughing or sneezing, hold a pillow firmly against your incision with both hands. This is called splinting. Doing this helps protect your incision. It also decreases belly discomfort.  If you are being discharged the day of surgery, you will not be allowed to drive home. You will need a responsible adult (18 years or older) to drive you home and stay with you that night.  If you are taking public transportation, you will need to have a responsible adult (18 years or older) with you. Please confirm with your physician that it is acceptable to use public transportation.   Please call the Pre-admissions Testing Dept. at [phone] if you have any questions about these instructions.  Surgery Visitation Policy:  Patients undergoing a surgery or procedure may have one family member or support person with them as  long as that person is not COVID-19 positive or experiencing its symptoms.  That person may remain in the waiting area during the procedure and may rotate out with other people.

## 2021-09-04 ENCOUNTER — Telehealth: Payer: Self-pay

## 2021-09-04 NOTE — Telephone Encounter (Signed)
Spoke with Esmond Camper at [facility] and let her know we have made several attempts to get Cardiac Clearance for this patient. She has surgery scheduled for 09/06/21. She let me know she would send an urgent message to the [facility] for Dr Edson Snowball and that I could also fax the request to the [address] [facility] where he is today. Request faxed to [facility] at [address].

## 2021-09-05 NOTE — Progress Notes (Unsigned)
Cardiac clearance received from Dr Mercy Riding. The patient is cleared at Low risk.

## 2021-09-06 ENCOUNTER — Ambulatory Visit: Payer: Medicaid Other | Admitting: Certified Registered"

## 2021-09-06 ENCOUNTER — Encounter
Admission: RE | Disposition: A | Discharge status: Home / Self Care | Payer: Self-pay | Source: Home / Self Care | Attending: Surgery

## 2021-09-06 ENCOUNTER — Encounter: Payer: Self-pay | Admitting: Surgery

## 2021-09-06 ENCOUNTER — Other Ambulatory Visit: Payer: Self-pay

## 2021-09-06 ENCOUNTER — Ambulatory Visit
Admission: RE | Admit: 2021-09-06 | Discharge: 2021-09-06 | Disposition: A | Discharge status: Home / Self Care | Payer: Medicaid Other | Attending: Surgery | Admitting: Surgery

## 2021-09-06 DIAGNOSIS — F17200 Nicotine dependence, unspecified, uncomplicated: Secondary | ICD-10-CM | POA: Insufficient documentation

## 2021-09-06 DIAGNOSIS — F419 Anxiety disorder, unspecified: Secondary | ICD-10-CM | POA: Insufficient documentation

## 2021-09-06 DIAGNOSIS — E669 Obesity, unspecified: Secondary | ICD-10-CM | POA: Diagnosis not present

## 2021-09-06 DIAGNOSIS — Z6832 Body mass index (BMI) 32.0-32.9, adult: Secondary | ICD-10-CM | POA: Insufficient documentation

## 2021-09-06 DIAGNOSIS — F319 Bipolar disorder, unspecified: Secondary | ICD-10-CM | POA: Diagnosis not present

## 2021-09-06 DIAGNOSIS — K660 Peritoneal adhesions (postprocedural) (postinfection): Secondary | ICD-10-CM | POA: Diagnosis not present

## 2021-09-06 DIAGNOSIS — Z9851 Tubal ligation status: Secondary | ICD-10-CM | POA: Diagnosis not present

## 2021-09-06 DIAGNOSIS — K432 Incisional hernia without obstruction or gangrene: Secondary | ICD-10-CM

## 2021-09-06 HISTORY — PX: XI ROBOTIC ASSISTED VENTRAL HERNIA: SHX6789

## 2021-09-06 HISTORY — PX: INSERTION OF MESH: SHX5868

## 2021-09-06 LAB — POCT PREGNANCY, URINE: Preg Test, Ur: NEGATIVE

## 2021-09-06 SURGERY — REPAIR, HERNIA, VENTRAL, ROBOT-ASSISTED
Anesthesia: General

## 2021-09-06 MED ORDER — ROCURONIUM BROMIDE 100 MG/10ML IV SOLN
INTRAVENOUS | Status: DC | PRN
  Administered 2021-09-06: 20 mg via INTRAVENOUS
  Administered 2021-09-06: 50 mg via INTRAVENOUS
  Administered 2021-09-06 (×2): 10 mg via INTRAVENOUS

## 2021-09-06 MED ORDER — FENTANYL CITRATE (PF) 100 MCG/2ML IJ SOLN
INTRAMUSCULAR | Status: AC
  Filled 2021-09-06: qty 2

## 2021-09-06 MED ORDER — PROMETHAZINE HCL 25 MG/ML IJ SOLN: 6.2500 mg | INTRAMUSCULAR | Status: DC | PRN

## 2021-09-06 MED ORDER — BUPIVACAINE-EPINEPHRINE (PF) 0.5% -1:200000 IJ SOLN
INTRAMUSCULAR | Status: AC
  Filled 2021-09-06: qty 30

## 2021-09-06 MED ORDER — OXYCODONE HCL 5 MG PO TABS
ORAL_TABLET | ORAL | Status: AC
  Filled 2021-09-06: qty 1

## 2021-09-06 MED ORDER — BUPIVACAINE-EPINEPHRINE 0.5% -1:200000 IJ SOLN
INTRAMUSCULAR | Status: DC | PRN
  Administered 2021-09-06: 30 mL

## 2021-09-06 MED ORDER — SUGAMMADEX SODIUM 200 MG/2ML IV SOLN
INTRAVENOUS | Status: DC | PRN
  Administered 2021-09-06: 200 mg via INTRAVENOUS

## 2021-09-06 MED ORDER — MIDAZOLAM HCL 2 MG/2ML IJ SOLN
INTRAMUSCULAR | Status: AC
  Filled 2021-09-06: qty 2

## 2021-09-06 MED ORDER — PROPOFOL 10 MG/ML IV BOLUS
INTRAVENOUS | Status: DC | PRN
  Administered 2021-09-06: 200 mg via INTRAVENOUS

## 2021-09-06 MED ORDER — GABAPENTIN 300 MG PO CAPS: 300.0000 mg | ORAL_CAPSULE | ORAL | Status: AC

## 2021-09-06 MED ORDER — ACETAMINOPHEN 500 MG PO TABS
1000.0000 mg | ORAL_TABLET | Freq: Four times a day (QID) | ORAL | Status: AC | PRN
Start: 2021-09-06 — End: ?

## 2021-09-06 MED ORDER — ORAL CARE MOUTH RINSE: 15.0000 mL | Freq: Once | OROMUCOSAL | Status: AC

## 2021-09-06 MED ORDER — DEXAMETHASONE SODIUM PHOSPHATE 10 MG/ML IJ SOLN
INTRAMUSCULAR | Status: DC | PRN
  Administered 2021-09-06: 10 mg via INTRAVENOUS

## 2021-09-06 MED ORDER — DEXAMETHASONE SODIUM PHOSPHATE 10 MG/ML IJ SOLN
INTRAMUSCULAR | Status: AC
  Filled 2021-09-06: qty 1

## 2021-09-06 MED ORDER — FENTANYL CITRATE (PF) 100 MCG/2ML IJ SOLN
25.0000 ug | INTRAMUSCULAR | Status: DC | PRN
  Administered 2021-09-06: 25 ug via INTRAVENOUS
  Administered 2021-09-06 (×2): 50 ug via INTRAVENOUS

## 2021-09-06 MED ORDER — ACETAMINOPHEN 500 MG PO TABS: 1000.0000 mg | ORAL_TABLET | ORAL | Status: AC

## 2021-09-06 MED ORDER — BUPIVACAINE LIPOSOME 1.3 % IJ SUSP
INTRAMUSCULAR | Status: AC
  Filled 2021-09-06: qty 20

## 2021-09-06 MED ORDER — GABAPENTIN 300 MG PO CAPS
ORAL_CAPSULE | ORAL | Status: AC
  Administered 2021-09-06: 300 mg via ORAL
  Filled 2021-09-06: qty 1

## 2021-09-06 MED ORDER — OXYCODONE HCL 5 MG PO TABS
5.0000 mg | ORAL_TABLET | Freq: Once | ORAL | Status: AC | PRN
  Administered 2021-09-06: 5 mg via ORAL

## 2021-09-06 MED ORDER — ACETAMINOPHEN 10 MG/ML IV SOLN
INTRAVENOUS | Status: AC
  Filled 2021-09-06: qty 100

## 2021-09-06 MED ORDER — MIDAZOLAM HCL 2 MG/2ML IJ SOLN
INTRAMUSCULAR | Status: DC | PRN
  Administered 2021-09-06: 2 mg via INTRAVENOUS

## 2021-09-06 MED ORDER — CHLORHEXIDINE GLUCONATE CLOTH 2 % EX PADS: 6.0000 | MEDICATED_PAD | Freq: Once | CUTANEOUS | Status: DC

## 2021-09-06 MED ORDER — SODIUM CHLORIDE 0.9 % IV SOLN
INTRAVENOUS | Status: DC | PRN
  Administered 2021-09-06: 40 mL

## 2021-09-06 MED ORDER — ROCURONIUM BROMIDE 10 MG/ML (PF) SYRINGE
PREFILLED_SYRINGE | INTRAVENOUS | Status: AC
  Filled 2021-09-06: qty 10

## 2021-09-06 MED ORDER — IBUPROFEN 800 MG PO TABS: 800.0000 mg | ORAL_TABLET | Freq: Three times a day (TID) | ORAL | 1 refills | Status: AC | PRN

## 2021-09-06 MED ORDER — FENTANYL CITRATE (PF) 100 MCG/2ML IJ SOLN
INTRAMUSCULAR | Status: DC | PRN
  Administered 2021-09-06 (×2): 50 ug via INTRAVENOUS

## 2021-09-06 MED ORDER — BUPIVACAINE LIPOSOME 1.3 % IJ SUSP: 20.0000 mL | Freq: Once | INTRAMUSCULAR | Status: DC

## 2021-09-06 MED ORDER — FENTANYL CITRATE (PF) 100 MCG/2ML IJ SOLN
INTRAMUSCULAR | Status: AC
  Administered 2021-09-06: 25 ug via INTRAVENOUS
  Filled 2021-09-06: qty 2

## 2021-09-06 MED ORDER — ACETAMINOPHEN 500 MG PO TABS
ORAL_TABLET | ORAL | Status: AC
  Administered 2021-09-06: 1000 mg via ORAL
  Filled 2021-09-06: qty 2

## 2021-09-06 MED ORDER — OXYCODONE HCL 5 MG PO TABS: 5.0000 mg | ORAL_TABLET | ORAL | 0 refills | Status: DC | PRN

## 2021-09-06 MED ORDER — PROPOFOL 10 MG/ML IV BOLUS
INTRAVENOUS | Status: AC
  Filled 2021-09-06: qty 20

## 2021-09-06 MED ORDER — CHLORHEXIDINE GLUCONATE 0.12 % MT SOLN: 15.0000 mL | Freq: Once | OROMUCOSAL | Status: AC

## 2021-09-06 MED ORDER — FAMOTIDINE 20 MG PO TABS
ORAL_TABLET | ORAL | Status: AC
  Administered 2021-09-06: 20 mg via ORAL
  Filled 2021-09-06: qty 1

## 2021-09-06 MED ORDER — LIDOCAINE HCL (CARDIAC) PF 100 MG/5ML IV SOSY
PREFILLED_SYRINGE | INTRAVENOUS | Status: DC | PRN
  Administered 2021-09-06: 100 mg via INTRAVENOUS

## 2021-09-06 MED ORDER — ONDANSETRON HCL 4 MG/2ML IJ SOLN
INTRAMUSCULAR | Status: AC
  Filled 2021-09-06: qty 2

## 2021-09-06 MED ORDER — CHLORHEXIDINE GLUCONATE 0.12 % MT SOLN
OROMUCOSAL | Status: AC
  Administered 2021-09-06: 15 mL via OROMUCOSAL
  Filled 2021-09-06: qty 15

## 2021-09-06 MED ORDER — KETOROLAC TROMETHAMINE 30 MG/ML IJ SOLN
INTRAMUSCULAR | Status: DC | PRN
  Administered 2021-09-06: 30 mg via INTRAVENOUS

## 2021-09-06 MED ORDER — FAMOTIDINE 20 MG PO TABS: 20.0000 mg | ORAL_TABLET | Freq: Once | ORAL | Status: AC

## 2021-09-06 MED ORDER — LACTATED RINGERS IV SOLN: INTRAVENOUS | Status: DC

## 2021-09-06 MED ORDER — LIDOCAINE HCL (PF) 2 % IJ SOLN
INTRAMUSCULAR | Status: AC
  Filled 2021-09-06: qty 5

## 2021-09-06 MED ORDER — ACETAMINOPHEN 10 MG/ML IV SOLN
1000.0000 mg | Freq: Once | INTRAVENOUS | Status: DC | PRN
  Administered 2021-09-06: 1000 mg via INTRAVENOUS

## 2021-09-06 MED ORDER — CEFAZOLIN SODIUM-DEXTROSE 2-4 GM/100ML-% IV SOLN
INTRAVENOUS | Status: AC
  Filled 2021-09-06: qty 100

## 2021-09-06 MED ORDER — OXYCODONE HCL 5 MG/5ML PO SOLN: 5.0000 mg | Freq: Once | ORAL | Status: AC | PRN

## 2021-09-06 MED ORDER — ONDANSETRON HCL 4 MG/2ML IJ SOLN
INTRAMUSCULAR | Status: DC | PRN
Start: 2021-09-06 — End: 2021-09-06
  Administered 2021-09-06: 4 mg via INTRAVENOUS

## 2021-09-06 MED ORDER — CEFAZOLIN SODIUM-DEXTROSE 2-4 GM/100ML-% IV SOLN
2.0000 g | INTRAVENOUS | Status: AC
  Administered 2021-09-06: 2 g via INTRAVENOUS

## 2021-09-06 SURGICAL SUPPLY — 62 items
"PENCIL ELECTRO HAND CTR " (MISCELLANEOUS) ×2 IMPLANT
ADH SKN CLS APL DERMABOND .7 (GAUZE/BANDAGES/DRESSINGS) ×2
APL PRP STRL LF DISP 70% ISPRP (MISCELLANEOUS)
BINDER ABDOMINAL 12 ML 46-62 (SOFTGOODS) ×1 IMPLANT
BLADE SURG SZ11 CARB STEEL (BLADE) ×4 IMPLANT
CANNULA REDUC XI 12-8 STAPL (CANNULA) ×3
CANNULA REDUCER 12-8 DVNC XI (CANNULA) ×3 IMPLANT
CHLORAPREP W/TINT 26 (MISCELLANEOUS) ×3 IMPLANT
COVER TIP SHEARS 8 DVNC (MISCELLANEOUS) ×3 IMPLANT
COVER TIP SHEARS 8MM DA VINCI (MISCELLANEOUS) ×3
DEFOGGER SCOPE WARMER CLEARIFY (MISCELLANEOUS) ×4 IMPLANT
DERMABOND ADVANCED (GAUZE/BANDAGES/DRESSINGS) ×1
DERMABOND ADVANCED .7 DNX12 (GAUZE/BANDAGES/DRESSINGS) ×3 IMPLANT
DRAPE ARM DVNC X/XI (DISPOSABLE) ×9 IMPLANT
DRAPE COLUMN DVNC XI (DISPOSABLE) ×3 IMPLANT
DRAPE DA VINCI XI ARM (DISPOSABLE) ×9
DRAPE DA VINCI XI COLUMN (DISPOSABLE) ×3
ELECT CAUTERY BLADE TIP 2.5 (TIP) ×3
ELECT REM PT RETURN 9FT ADLT (ELECTROSURGICAL) ×3
ELECTRODE CAUTERY BLDE TIP 2.5 (TIP) ×3 IMPLANT
ELECTRODE REM PT RTRN 9FT ADLT (ELECTROSURGICAL) ×3 IMPLANT
GLOVE SURG SYN 7.0 (GLOVE) ×15 IMPLANT
GLOVE SURG SYN 7.0 PF PI (GLOVE) ×6 IMPLANT
GLOVE SURG SYN 7.5  E (GLOVE) ×15
GLOVE SURG SYN 7.5 E (GLOVE) ×10 IMPLANT
GLOVE SURG SYN 7.5 PF PI (GLOVE) ×6 IMPLANT
GOWN STRL REUS W/ TWL LRG LVL3 (GOWN DISPOSABLE) ×9 IMPLANT
GOWN STRL REUS W/TWL LRG LVL3 (GOWN DISPOSABLE) ×15
GRASPER SUT TROCAR 14GX15 (MISCELLANEOUS) ×4 IMPLANT
IRRIGATION STRYKERFLOW (MISCELLANEOUS) IMPLANT
IRRIGATOR STRYKERFLOW (MISCELLANEOUS)
IV NS 1000ML (IV SOLUTION)
IV NS 1000ML BAXH (IV SOLUTION) IMPLANT
KIT PINK PAD W/HEAD ARE REST (MISCELLANEOUS) ×3
KIT PINK PAD W/HEAD ARM REST (MISCELLANEOUS) ×3 IMPLANT
LABEL OR SOLS (LABEL) ×4 IMPLANT
MANIFOLD NEPTUNE II (INSTRUMENTS) ×4 IMPLANT
MESH VENT LT ST 10.2X15.2CM EL (Mesh General) ×1 IMPLANT
NDL INSUFFLATION 14GA 120MM (NEEDLE) ×3 IMPLANT
NEEDLE HYPO 22GX1.5 SAFETY (NEEDLE) ×4 IMPLANT
NEEDLE INSUFFLATION 14GA 120MM (NEEDLE) ×3 IMPLANT
OBTURATOR OPTICAL STANDARD 8MM (TROCAR) ×3
OBTURATOR OPTICAL STND 8 DVNC (TROCAR) ×2
OBTURATOR OPTICALSTD 8 DVNC (TROCAR) ×3 IMPLANT
PACK LAP CHOLECYSTECTOMY (MISCELLANEOUS) ×4 IMPLANT
PENCIL ELECTRO HAND CTR (MISCELLANEOUS) ×4 IMPLANT
SEAL CANN UNIV 5-8 DVNC XI (MISCELLANEOUS) ×6 IMPLANT
SEAL XI 5MM-8MM UNIVERSAL (MISCELLANEOUS) ×6
SET TUBE SMOKE EVAC HIGH FLOW (TUBING) ×4 IMPLANT
SOLUTION ELECTROLUBE (MISCELLANEOUS) ×4 IMPLANT
SPONGE T-LAP 18X18 ~~LOC~~+RFID (SPONGE) ×4 IMPLANT
STAPLER CANNULA SEAL DVNC XI (STAPLE) ×3 IMPLANT
STAPLER CANNULA SEAL XI (STAPLE) ×3
SUT MNCRL 4-0 (SUTURE) ×3
SUT MNCRL 4-0 27XMFL (SUTURE) ×2
SUT STRATAFIX PDS 30 CT-1 (SUTURE) ×5 IMPLANT
SUT VICRYL 0 AB UR-6 (SUTURE) ×6 IMPLANT
SUT VLOC 90 2/L VL 12 GS22 (SUTURE) ×8 IMPLANT
SUTURE MNCRL 4-0 27XMF (SUTURE) ×3 IMPLANT
TRAY FOLEY SLVR 16FR LF STAT (SET/KITS/TRAYS/PACK) ×4 IMPLANT
WAND RF SURG SPNG DETECT SYS (INSTRUMENTS) ×4 IMPLANT
WATER STERILE IRR 500ML POUR (IV SOLUTION) ×3 IMPLANT

## 2021-09-06 NOTE — Discharge Instructions (Signed)

## 2021-09-06 NOTE — Brief Op Note (Signed)
09/06/2021  2:33 PM  PATIENT:  [name]  36 y.o. female  PRE-OPERATIVE DIAGNOSIS:  recurrent incisional hernia  POST-OPERATIVE DIAGNOSIS:  recurrent incisional hernia  PROCEDURE:  Procedure(s) with comments: XI ROBOTIC ASSISTED VENTRAL HERNIA, incisional, recurrent (N/A) - Provider requesting 4 hours / 240 minutes for procedure. INSERTION OF MESH  SURGEON:  Surgeon(s) and Role:    * Clevon Khader, MD - Primary  ANESTHESIA:   general  EBL:  10 mL   BLOOD ADMINISTERED:none  DRAINS: none   LOCAL MEDICATIONS USED:  BUPIVICAINE   SPECIMEN:  No Specimen  DISPOSITION OF SPECIMEN:  N/A  COUNTS:  YES  DICTATION: .Dragon Dictation  PLAN OF CARE: Discharge to home after PACU  PATIENT DISPOSITION:  PACU - hemodynamically stable.   Delay start of Pharmacological VTE agent (>24hrs) due to surgical blood loss or risk of bleeding: yes

## 2021-09-06 NOTE — Anesthesia Preprocedure Evaluation (Addendum)
Anesthesia Evaluation  Patient identified by MRN, date of birth, ID band Patient awake    Reviewed: Allergy & Precautions, NPO status , Patient's Chart, lab work & pertinent test results  History of Anesthesia Complications Negative for: history of anesthetic complications  Airway Mallampati: III   Neck ROM: Full    Dental no notable dental hx.    Pulmonary Current SmokerPatient did not abstain from smoking.,    Pulmonary exam normal breath sounds clear to auscultation       Cardiovascular Exercise Tolerance: Good negative cardio ROS Normal cardiovascular exam Rhythm:Regular Rate:Normal     Neuro/Psych  Headaches, PSYCHIATRIC DISORDERS Anxiety Depression Bipolar Disorder Marijuana use    GI/Hepatic Symptomatic cholelithiasis   Endo/Other  Obesity   Renal/GU negative Renal ROS     Musculoskeletal   Abdominal (+) + obese,   Peds  Hematology  (+) Blood dyscrasia, anemia ,   Anesthesia Other Findings Recurrent incisional hernia  Reproductive/Obstetrics                            Anesthesia Physical  Anesthesia Plan  ASA: 2  Anesthesia Plan: General   Post-op Pain Management:    Induction: Intravenous  PONV Risk Score and Plan: 2 and Ondansetron, Dexamethasone, Treatment may vary due to age or medical condition and Midazolam  Airway Management Planned: Oral ETT  Additional Equipment:   Intra-op Plan:   Post-operative Plan: Extubation in OR  Informed Consent: I have reviewed the patients History and Physical, chart, labs and discussed the procedure including the risks, benefits and alternatives for the proposed anesthesia with the patient or authorized representative who has indicated his/her understanding and acceptance.     Dental advisory given  Plan Discussed with: CRNA and Anesthesiologist  Anesthesia Plan Comments:        Anesthesia Quick Evaluation

## 2021-09-06 NOTE — Op Note (Signed)
Procedure Date:  09/06/2021  Pre-operative Diagnosis:  Recurrent Incisional hernia  Post-operative Diagnosis: Recurrent incisional hernia, total defect length 7 cm.  Procedure:  Robotic assisted recurrent incisional Hernia Repair with mesh  Surgeon:  Howie Ill, MD  Anesthesia:  General endotracheal  Estimated Blood Loss:  10 ml  Specimens:  None  Complications:  None  Indications for Procedure:  This is a 36 y.o. female who presents with a recurrent incisional hernia.  The options of surgery versus observation were reviewed with the patient and/or family. The risks of bleeding, abscess or infection, recurrence of symptoms, potential for an open procedure, injury to surrounding structures, and chronic pain were all discussed with the patient and was willing to proceed.  Description of Procedure: The patient was correctly identified in the preoperative area and brought into the operating room.  The patient was placed supine with VTE prophylaxis in place.  Appropriate time-outs were performed.  Anesthesia was induced and the patient was intubated.  Appropriate antibiotics were infused.  The abdomen was prepped and draped in a sterile fashion. The patient's hernia defect was marked with a marking pen.  A Veress needle was introduced in the left upper quadrant and pneumoperitoneum was obtained with appropriate pressures.  Using Optiview technique, an 8 mm port was introduced in the right upper quadrant without complications.  Then, a 12 mm port was introduced in the upper abdomen at midline, and an 8 mm port in the left upper quadrant under direct visualization.  The DaVinci platform was docked, camera targeted, and instruments placed under direct visualization.  The patient's had adhesions of the omentum to the anterior abdominal wall at the site of prior umbilical port from robotic cholecystectomy and at the hernia sites.  Cautery and blunt dissection were used to free up the omentum  and to reduce the patient's hernia.  The patient was found to have actually two hernias in the lower abdomen.  While reducing the hernia and lysing adhesions, two metallic clips were found.  One clip was somewhat lose in the left pelvis, and one with some scar tissue adhering anterior to the uterus.  The patient had a prior tubal ligation history, but these clips were not ligating any structures and were otherwise loose.  Both clips were removed.  The uterus also had some adhesions to the abdominal wall, and this was taken down partially using blunt dissection and cautery to make enough room for possible mesh.  Then, ruler was introduced and the hernias were measured, with total length of 7 cm between the two.  At that point, a 4 x 6 inch Bard Ventralight ST Echo mesh, two 0 Stratafix sutures, and three 2-0 V-loc sutures were inserted through the 12 mm port under direct visualization.  The hernia defect was closed using the stratafix sutures.  A PMI was brought through the center of the hernia defects and the positioning system of the mesh was passed through and insufflated.  This allowed the mesh to splay open and be centered over the repair site with good overlap in all directions.  The mesh was then sutured in place circumferentially and through the center of the mesh using the V-loc sutures to ensure flat positioning.  All needles, ruler, and the positioning system were then removed through the 12 mm port without complications.  The DaVinci platform was then undocked and instruments removed.    70 ml of Exparel solution mixed with 0.5% bupivacaine with epi was infiltrated around the mesh edges,  hernia repair site, and port sites.  The 12 mm port was removed and the fascia was closed under direct visualization utilizing an Endo Close technique with 0 Vicryl suture.  The 8 mm ports were removed. The 12 mm incision was closed using 3-0 Vicryl and 4-0 Monocryl, and the other port incisions were closed with 4-0  Monocryl.  The wounds were cleaned and sealed with DermaBond.  The patient was emerged from anesthesia and extubated and brought to the recovery room for further management.  The patient tolerated the procedure well and all counts were correct at the end of the case.   Howie Ill, MD

## 2021-09-06 NOTE — Interval H&P Note (Signed)
History and Physical Interval Note:  09/06/2021 10:21 AM  [name]  has presented today for surgery, with the diagnosis of recurrent incisional hernia.  The various methods of treatment have been discussed with the patient and family. After consideration of risks, benefits and other options for treatment, the patient has consented to  Procedure(s) with comments: XI ROBOTIC ASSISTED VENTRAL HERNIA, incisional, recurrent (N/A) - Provider requesting 4 hours / 240 minutes for procedure. as a surgical intervention.  The patient's history has been reviewed, patient examined, no change in status, stable for surgery.  I have reviewed the patient's chart and labs.  Questions were answered to the patient's satisfaction.     Gibbs Naugle

## 2021-09-06 NOTE — Transfer of Care (Signed)
Immediate Anesthesia Transfer of Care Note  Patient: [name]  Procedure(s) Performed: XI ROBOTIC ASSISTED VENTRAL HERNIA, incisional, recurrent INSERTION OF MESH  Patient Location: PACU  Anesthesia Type:General  Level of Consciousness: awake  Airway & Oxygen Therapy: Patient Spontanous Breathing and Patient connected to face mask oxygen  Post-op Assessment: Report given to RN and Post -op Vital signs reviewed and stable  Post vital signs: Reviewed and stable  Last Vitals:  Vitals Value Taken Time  BP 137/64 09/06/21 1430  Temp 36.1 C 09/06/21 1423  Pulse 82 09/06/21 1430  Resp 22 09/06/21 1430  SpO2 100 % 09/06/21 1430  Vitals shown include unvalidated device data.  Last Pain:  Vitals:   09/06/21 1423  TempSrc:   PainSc: Asleep         Complications: No notable events documented.

## 2021-09-06 NOTE — Anesthesia Procedure Notes (Signed)
Procedure Name: Intubation Date/Time: 09/06/2021 10:58 AM Performed by: Cammie Sickle, CRNA Pre-anesthesia Checklist: Patient identified, Emergency Drugs available, Suction available and Patient being monitored Patient Re-evaluated:Patient Re-evaluated prior to induction Oxygen Delivery Method: Circle system utilized Preoxygenation: Pre-oxygenation with 100% oxygen Induction Type: IV induction Ventilation: Mask ventilation without difficulty Laryngoscope Size: McGraph and 3 Grade View: Grade I Tube type: Oral Tube size: 7.0 mm Number of attempts: 1 Airway Equipment and Method: Stylet Placement Confirmation: ETT inserted through vocal cords under direct vision, positive ETCO2 and breath sounds checked- equal and bilateral Secured at: 22 cm Tube secured with: Tape Dental Injury: Teeth and Oropharynx as per pre-operative assessment

## 2021-09-07 ENCOUNTER — Encounter: Payer: Self-pay | Admitting: Surgery

## 2021-09-09 NOTE — Anesthesia Postprocedure Evaluation (Signed)
Anesthesia Post Note  Patient: [name]  Procedure(s) Performed: XI ROBOTIC ASSISTED VENTRAL HERNIA, incisional, recurrent INSERTION OF MESH  Patient location during evaluation: PACU Anesthesia Type: General Level of consciousness: awake and alert Pain management: pain level controlled Vital Signs Assessment: post-procedure vital signs reviewed and stable Respiratory status: spontaneous breathing, nonlabored ventilation, respiratory function stable and patient connected to nasal cannula oxygen Cardiovascular status: blood pressure returned to baseline and stable Postop Assessment: no apparent nausea or vomiting Anesthetic complications: no   No notable events documented.   Last Vitals:  Vitals:   09/06/21 1545 09/06/21 1604  BP: 110/81 120/75  Pulse: 72 76  Resp: 18 18  Temp: (!) 36.1 C (!) 36.1 C  SpO2: 94% 100%    Last Pain:  Vitals:   09/06/21 1604  TempSrc:   PainSc: 3                  Yevette Edwards

## 2021-09-12 ENCOUNTER — Telehealth: Payer: Self-pay

## 2021-09-12 ENCOUNTER — Other Ambulatory Visit: Payer: Self-pay | Admitting: Surgery

## 2021-09-12 MED ORDER — OXYCODONE HCL 5 MG PO TABS: 5.0000 mg | ORAL_TABLET | ORAL | 0 refills | Status: AC | PRN

## 2021-09-12 NOTE — Telephone Encounter (Signed)
Patient requesting a refill on Oxycodone. She stated she dropped her pill in the sink and has taken the ibuprofen but its not helping by itself.

## 2021-09-21 ENCOUNTER — Ambulatory Visit (INDEPENDENT_AMBULATORY_CARE_PROVIDER_SITE_OTHER): Payer: Medicaid Other | Admitting: Surgery

## 2021-09-21 ENCOUNTER — Other Ambulatory Visit: Payer: Self-pay

## 2021-09-21 ENCOUNTER — Encounter: Payer: Self-pay | Admitting: Surgery

## 2021-09-21 VITALS — BP 131/86 | HR 121 | Temp 99.1°F | Ht 59.0 in | Wt 162.6 lb

## 2021-09-21 DIAGNOSIS — Z09 Encounter for follow-up examination after completed treatment for conditions other than malignant neoplasm: Secondary | ICD-10-CM

## 2021-09-21 DIAGNOSIS — K432 Incisional hernia without obstruction or gangrene: Secondary | ICD-10-CM | POA: Diagnosis not present

## 2021-09-21 NOTE — Progress Notes (Signed)
09/21/2021  HPI: [name] is a 36 y.o. female s/p robotic assisted recurrent incisional hernia repair on 09/06/21.  Patient presents today for follow up.  She reports that she's been having some pain issues around the lower abdomen mostly and is afraid that she may have torn something.  She also feels like she's having some spasms sometimes.  She had also been constipated and that's now improving.  Vital signs: BP 131/86    Pulse (!) 121    Temp 99.1 F (37.3 C) (Oral)    Ht 4\' 11"  (1.499 m)    Wt 162 lb 9.6 oz (73.8 kg)    SpO2 98%    BMI 32.84 kg/m    Physical Exam: Constitutional:  No acute distress Abdomen:  soft, non-distended, appropriately sore to palpation in the lower abdomen.  Incisions are healing well and are clean, dry, intact.  No evidence of hernia recurrence.  Assessment/Plan: This is a 36 y.o. female s/p robotic assisted recurrent incisional hernia repair.  --Discussed with the patient that a lot of her symptoms are likely related to the inflammation and pulling from the scarring and the sutures.  This should continue to improve with time.  Recommended that she wear the abdominal binder given after surgery to help with the strain on the incision/mesh. --She may also start MiraLax to help with constipation.  This may have also contributed to some of the lower abdominal pain. --Follow up in 1 month to assess her progress.   Melvyn Neth, [facility]

## 2021-09-21 NOTE — Patient Instructions (Addendum)
Continue to wear your abdominal binder. Try Miralax daily until your bowel movements are regular. Please see your appointment listed below.       GENERAL POST-OPERATIVE PATIENT INSTRUCTIONS   WOUND CARE INSTRUCTIONS:  Keep a dry clean dressing on the wound if there is drainage. The initial bandage may be removed after 24 hours.  Once the wound has quit draining you may leave it open to air.  If clothing rubs against the wound or causes irritation and the wound is not draining you may cover it with a dry dressing during the daytime.  Try to keep the wound dry and avoid ointments on the wound unless directed to do so.  If the wound becomes bright red and painful or starts to drain infected material that is not clear, please contact your physician immediately.  If the wound is mildly pink and has a thick firm ridge underneath it, this is normal, and is referred to as a healing ridge.  This will resolve over the next 4-6 weeks.  BATHING: You may shower if you have been informed of this by your surgeon. However, Please do not submerge in a tub, hot tub, or pool until incisions are completely sealed or have been told by your surgeon that you may do so.  DIET:  You may eat any foods that you can tolerate.  It is a good idea to eat a high fiber diet and take in plenty of fluids to prevent constipation.  If you do become constipated you may want to take a mild laxative or take ducolax tablets on a daily basis until your bowel habits are regular.  Constipation can be very uncomfortable, along with straining, after recent surgery.  ACTIVITY:  You are encouraged to cough and deep breath or use your incentive spirometer if you were given one, every 15-30 minutes when awake.  This will help prevent respiratory complications and low grade fevers post-operatively if you had a general anesthetic.  You may want to hug a pillow when coughing and sneezing to add additional support to the surgical area, if you had  abdominal or chest surgery, which will decrease pain during these times.  You are encouraged to walk and engage in light activity for the next two weeks.  You should not lift more than 20 pounds, until 10/18/2021 as it could put you at increased risk for complications.  Twenty pounds is roughly equivalent to a plastic bag of groceries. At that time- Listen to your body when lifting, if you have pain when lifting, stop and then try again in a few days. Soreness after doing exercises or activities of daily living is normal as you get back in to your normal routine.  MEDICATIONS:  Try to take narcotic medications and anti-inflammatory medications, such as tylenol, ibuprofen, naprosyn, etc., with food.  This will minimize stomach upset from the medication.  Should you develop nausea and vomiting from the pain medication, or develop a rash, please discontinue the medication and contact your physician.  You should not drive, make important decisions, or operate machinery when taking narcotic pain medication.  SUNBLOCK Use sun block to incision area over the next year if this area will be exposed to sun. This helps decrease scarring and will allow you avoid a permanent darkened area over your incision.  QUESTIONS:  Please feel free to call our office if you have any questions, and we will be glad to assist you. [phone]

## 2021-10-19 ENCOUNTER — Encounter: Payer: Medicaid Other | Admitting: Surgery

## 2021-11-22 ENCOUNTER — Encounter: Payer: Self-pay | Admitting: General Practice

## 2022-01-21 ENCOUNTER — Ambulatory Visit: Payer: Medicaid Other | Admitting: Family Medicine

## 2022-01-21 NOTE — Progress Notes (Signed)
Opened in error

## 2022-08-17 ENCOUNTER — Other Ambulatory Visit: Payer: Self-pay | Admitting: Surgery

## 2022-10-24 ENCOUNTER — Encounter: Payer: Medicaid Other | Admitting: Nurse Practitioner

## 2023-06-24 ENCOUNTER — Encounter (HOSPITAL_BASED_OUTPATIENT_CLINIC_OR_DEPARTMENT_OTHER): Payer: Self-pay | Admitting: Urology

## 2023-06-24 ENCOUNTER — Emergency Department (HOSPITAL_BASED_OUTPATIENT_CLINIC_OR_DEPARTMENT_OTHER): Payer: Medicaid Other

## 2023-06-24 ENCOUNTER — Emergency Department (HOSPITAL_BASED_OUTPATIENT_CLINIC_OR_DEPARTMENT_OTHER)
Admission: EM | Admit: 2023-06-24 | Discharge: 2023-06-24 | Discharge status: Against Medical Advice | Payer: Medicaid Other | Attending: Emergency Medicine | Admitting: Emergency Medicine

## 2023-06-24 ENCOUNTER — Other Ambulatory Visit: Payer: Self-pay

## 2023-06-24 DIAGNOSIS — Z5321 Procedure and treatment not carried out due to patient leaving prior to being seen by health care provider: Secondary | ICD-10-CM | POA: Insufficient documentation

## 2023-06-24 DIAGNOSIS — R42 Dizziness and giddiness: Secondary | ICD-10-CM | POA: Insufficient documentation

## 2023-06-24 DIAGNOSIS — R0602 Shortness of breath: Secondary | ICD-10-CM | POA: Insufficient documentation

## 2023-06-24 DIAGNOSIS — R000 Tachycardia, unspecified: Secondary | ICD-10-CM | POA: Diagnosis not present

## 2023-06-24 DIAGNOSIS — R079 Chest pain, unspecified: Secondary | ICD-10-CM | POA: Diagnosis present

## 2023-06-24 LAB — CBC
HCT: 42.1 % (ref 36.0–46.0)
Hemoglobin: 14 g/dL (ref 12.0–15.0)
MCH: 29.4 pg (ref 26.0–34.0)
MCHC: 33.3 g/dL (ref 30.0–36.0)
MCV: 88.3 fL (ref 80.0–100.0)
Platelets: 469 10*3/uL — ABNORMAL HIGH (ref 150–400)
RBC: 4.77 MIL/uL (ref 3.87–5.11)
RDW: 13.9 % (ref 11.5–15.5)
WBC: 13 10*3/uL — ABNORMAL HIGH (ref 4.0–10.5)
nRBC: 0 % (ref 0.0–0.2)

## 2023-06-24 LAB — BASIC METABOLIC PANEL
Anion gap: 9 (ref 5–15)
BUN: 7 mg/dL (ref 6–20)
CO2: 26 mmol/L (ref 22–32)
Calcium: 9.1 mg/dL (ref 8.9–10.3)
Chloride: 102 mmol/L (ref 98–111)
Creatinine, Ser: 0.71 mg/dL (ref 0.44–1.00)
GFR, Estimated: >60 mL/min (ref >60)
Glucose, Bld: 102 mg/dL — ABNORMAL HIGH (ref 70–99)
Potassium: 4.3 mmol/L (ref 3.5–5.1)
Sodium: 137 mmol/L (ref 135–145)

## 2023-06-24 LAB — TROPONIN I (HIGH SENSITIVITY): Troponin I (High Sensitivity): <2 ng/L (ref <18)

## 2023-06-24 NOTE — ED Notes (Signed)
Pt states wants to leave, no longer wants to wait, encouraged to stay, instructed to return for any new or worsening symptoms.  IV removed, pressure dressing applied

## 2023-06-24 NOTE — ED Triage Notes (Signed)
Pt states central chest pain that started this weekend and states today is now feeling dizzy and SOB  States feels like it is skipping a beat  Seeing Cardiologist for tachycardia   HR 85 in triage

## 2024-05-05 ENCOUNTER — Emergency Department (HOSPITAL_BASED_OUTPATIENT_CLINIC_OR_DEPARTMENT_OTHER): Admission: EM | Admit: 2024-05-05 | Discharge: 2024-05-05 | Disposition: A | Discharge status: Home / Self Care

## 2024-05-05 ENCOUNTER — Other Ambulatory Visit: Payer: Self-pay

## 2024-05-05 ENCOUNTER — Emergency Department (HOSPITAL_BASED_OUTPATIENT_CLINIC_OR_DEPARTMENT_OTHER)

## 2024-05-05 ENCOUNTER — Encounter (HOSPITAL_BASED_OUTPATIENT_CLINIC_OR_DEPARTMENT_OTHER): Payer: Self-pay

## 2024-05-05 DIAGNOSIS — Z91040 Latex allergy status: Secondary | ICD-10-CM | POA: Insufficient documentation

## 2024-05-05 DIAGNOSIS — R059 Cough, unspecified: Secondary | ICD-10-CM | POA: Insufficient documentation

## 2024-05-05 DIAGNOSIS — R112 Nausea with vomiting, unspecified: Secondary | ICD-10-CM | POA: Diagnosis present

## 2024-05-05 DIAGNOSIS — Z7982 Long term (current) use of aspirin: Secondary | ICD-10-CM | POA: Diagnosis not present

## 2024-05-05 DIAGNOSIS — D72829 Elevated white blood cell count, unspecified: Secondary | ICD-10-CM | POA: Insufficient documentation

## 2024-05-05 LAB — COMPREHENSIVE METABOLIC PANEL WITH GFR
ALT: 13 U/L (ref 0–44)
AST: 15 U/L (ref 15–41)
Albumin: 4.4 g/dL (ref 3.5–5.0)
Alkaline Phosphatase: 93 U/L (ref 38–126)
Anion gap: 13 (ref 5–15)
BUN: 11 mg/dL (ref 6–20)
CO2: 23 mmol/L (ref 22–32)
Calcium: 9.4 mg/dL (ref 8.9–10.3)
Chloride: 104 mmol/L (ref 98–111)
Creatinine, Ser: 0.56 mg/dL (ref 0.44–1.00)
GFR, Estimated: >60 mL/min (ref >60)
Glucose, Bld: 79 mg/dL (ref 70–99)
Potassium: 3.9 mmol/L (ref 3.5–5.1)
Sodium: 141 mmol/L (ref 135–145)
Total Bilirubin: 0.3 mg/dL (ref 0.0–1.2)
Total Protein: 7.7 g/dL (ref 6.5–8.1)

## 2024-05-05 LAB — CBC WITH DIFFERENTIAL/PLATELET
Abs Immature Granulocytes: 0.03 K/uL (ref 0.00–0.07)
Basophils Absolute: 0.1 K/uL (ref 0.0–0.1)
Basophils Relative: 1 %
Eosinophils Absolute: 0.5 K/uL (ref 0.0–0.5)
Eosinophils Relative: 4 %
HCT: 40.1 % (ref 36.0–46.0)
Hemoglobin: 13.4 g/dL (ref 12.0–15.0)
Immature Granulocytes: 0 %
Lymphocytes Relative: 24 %
Lymphs Abs: 2.8 K/uL (ref 0.7–4.0)
MCH: 29.3 pg (ref 26.0–34.0)
MCHC: 33.4 g/dL (ref 30.0–36.0)
MCV: 87.7 fL (ref 80.0–100.0)
Monocytes Absolute: 0.8 K/uL (ref 0.1–1.0)
Monocytes Relative: 7 %
Neutro Abs: 7.3 K/uL (ref 1.7–7.7)
Neutrophils Relative %: 64 %
Platelets: 443 K/uL — ABNORMAL HIGH (ref 150–400)
RBC: 4.57 MIL/uL (ref 3.87–5.11)
RDW: 15 % (ref 11.5–15.5)
WBC: 11.4 K/uL — ABNORMAL HIGH (ref 4.0–10.5)
nRBC: 0 % (ref 0.0–0.2)

## 2024-05-05 LAB — LIPASE, BLOOD: Lipase: 43 U/L (ref 11–51)

## 2024-05-05 LAB — RESP PANEL BY RT-PCR (RSV, FLU A&B, COVID)  RVPGX2
Influenza A by PCR: NEGATIVE
Influenza B by PCR: NEGATIVE
Resp Syncytial Virus by PCR: NEGATIVE
SARS Coronavirus 2 by RT PCR: NEGATIVE

## 2024-05-05 LAB — GROUP A STREP BY PCR: Group A Strep by PCR: NOT DETECTED

## 2024-05-05 MED ORDER — SODIUM CHLORIDE 0.9 % IV BOLUS
1000.0000 mL | Freq: Once | INTRAVENOUS | Status: AC
  Administered 2024-05-05: 1000 mL via INTRAVENOUS

## 2024-05-05 MED ORDER — ONDANSETRON 4 MG PO TBDP: 4.0000 mg | ORAL_TABLET | Freq: Three times a day (TID) | ORAL | 0 refills | Status: AC | PRN

## 2024-05-05 MED ORDER — ONDANSETRON HCL 4 MG/2ML IJ SOLN
4.0000 mg | Freq: Once | INTRAMUSCULAR | Status: AC
  Administered 2024-05-05: 4 mg via INTRAVENOUS
  Filled 2024-05-05: qty 2

## 2024-05-05 NOTE — ED Notes (Signed)
 Pt aware of necessity of urine sample.

## 2024-05-05 NOTE — ED Triage Notes (Signed)
 Pt reports that she had norovirus last week. States that she is having some pressure in her head, congestion, unable to eat. Can't keep food down. Reports cold sweats.Also reports a sore throat.

## 2024-05-05 NOTE — ED Provider Notes (Signed)
 [facility] Provider Note   CSN: [id number] Arrival date & time: 05/05/24  1103     Patient presents with: Abdominal Pain   [name] is a 38 y.o. female patient who presents to the emergency department today for further evaluation of intractable nausea, vomiting and diarrhea for the last week and a half.  Patient states she had norovirus which she contracted from an unknown source.  She did have a brief period of remission but over the last 4 days she has been unable to keep anything down.  Complaining of upper abdominal pain as well.  Denies any urinary symptoms but does endorse some generalized weakness and dizziness.  Still having some diarrhea as well and also some nasal congestion.    Abdominal Pain      Prior to Admission medications   Medication Sig Start Date End Date Taking? Authorizing Provider  ondansetron  (ZOFRAN -ODT) 4 MG disintegrating tablet Take 1 tablet (4 mg total) by mouth every 8 (eight) hours as needed for nausea or vomiting. 05/05/24  Yes Theotis, Chiron Campione M, PA-C  acetaminophen  (TYLENOL ) 500 MG tablet Take 2 tablets (1,000 mg total) by mouth every 6 (six) hours as needed for mild pain. 09/06/21   Desiderio Schanz, MD  aspirin-acetaminophen -caffeine (EXCEDRIN MIGRAINE) 250-250-65 MG tablet Take 2 tablets by mouth every 6 (six) hours as needed for headache.    [provider]  atomoxetine (STRATTERA) 25 MG capsule Take 25 mg by mouth daily.    [provider]  cloNIDine HCl (KAPVAY) 0.1 MG TB12 ER tablet Take 0.1 mg by mouth 2 (two) times daily. 05/18/21   [provider]  DULoxetine (CYMBALTA) 60 MG capsule Take 60 mg by mouth daily.    [provider]  hydrOXYzine  (ATARAX /VISTARIL ) 25 MG tablet Take 50 mg by mouth at bedtime.    [provider]  ibuprofen  (ADVIL ) 800 MG tablet Take 1 tablet (800 mg total) by mouth every 8 (eight) hours as needed for moderate pain. 09/06/21    Desiderio Schanz, MD  oxyCODONE  (OXY IR/ROXICODONE ) 5 MG immediate release tablet Take 1 tablet (5 mg total) by mouth every 4 (four) hours as needed for severe pain. 09/12/21   Desiderio Schanz, MD  Vitamin D , Ergocalciferol , (DRISDOL) 1.25 MG (50000 UNIT) CAPS capsule Take 50,000 Units by mouth every Sunday. 05/21/21   [provider]    Allergies: Other, Penicillins, Latex, and Benzalkonium chloride    Review of Systems  Gastrointestinal:  Positive for abdominal pain.  All other systems reviewed and are negative.   Updated Vital Signs BP 103/64 (BP Location: Right Arm)   Pulse (!) 57   Temp 98.6 F (37 C) (Oral)   Resp 18   Ht 4' 11 (1.499 m)   Wt 63.5 kg   SpO2 99%   BMI 28.27 kg/m   Physical Exam Vitals and nursing note reviewed.  Constitutional:      General: She is not in acute distress.    Appearance: Normal appearance.  HENT:     Head: Normocephalic and atraumatic.  Eyes:     General:        Right eye: No discharge.        Left eye: No discharge.  Cardiovascular:     Comments: Regular rate and rhythm.  S1/S2 are distinct without any evidence of murmur, rubs, or gallops.  Radial pulses are 2+ bilaterally.  Dorsalis pedis pulses are 2+ bilaterally.  No evidence of pedal edema.  Pulmonary:     Comments: Clear to auscultation bilaterally.  Normal effort.  No respiratory distress.  No evidence of wheezes, rales, or rhonchi heard throughout. Abdominal:     General: Abdomen is flat. Bowel sounds are normal. There is no distension.     Tenderness: There is no abdominal tenderness. There is no guarding or rebound.  Musculoskeletal:        General: Normal range of motion.     Cervical back: Neck supple.  Skin:    General: Skin is warm and dry.     Findings: No rash.  Neurological:     General: No focal deficit present.     Mental Status: She is alert.  Psychiatric:        Mood and Affect: Mood normal.        Behavior: Behavior normal.     (all labs ordered are  listed, but only abnormal results are displayed) Labs Reviewed  CBC WITH DIFFERENTIAL/PLATELET - Abnormal; Notable for the following components:      Result Value   WBC 11.4 (*)    Platelets 443 (*)    All other components within normal limits  GROUP A STREP BY PCR  RESP PANEL BY RT-PCR (RSV, FLU A&B, COVID)  [id number]  COMPREHENSIVE METABOLIC PANEL WITH GFR  LIPASE, BLOOD  URINALYSIS, ROUTINE W REFLEX MICROSCOPIC    EKG: None  Radiology: DG Chest 2 View Result Date: 05/05/2024 CLINICAL DATA:  Cough and congestion.  Norovirus last week. EXAM: CHEST - 2 VIEW COMPARISON:  06/24/2023 FINDINGS: Lungs are adequately inflated without focal airspace consolidation or effusion. Cardiomediastinal silhouette, bones and soft tissues are normal. IMPRESSION: No active cardiopulmonary disease. Electronically Signed   By: Toribio Agreste M.D.   On: 05/05/2024 11:49     Procedures   Medications Ordered in the ED  sodium chloride  0.9 % bolus 1,000 mL (0 mLs Intravenous Stopped 05/05/24 1404)  ondansetron  (ZOFRAN ) injection 4 mg (4 mg Intravenous Given 05/05/24 1312)  sodium chloride  0.9 % bolus 1,000 mL (0 mLs Intravenous Stopped 05/05/24 1540)    Clinical Course as of 05/05/24 1630  Wed May 05, 2024  1553 CBC with Differential(!) Leukocytosis. [CF]  1554 Comprehensive metabolic panel Negative. [CF]  1554 Group A Strep by PCR if patient complains of sore throat. Negative. [CF]  1554 Lipase, blood Negative.  [CF]    Clinical Course User Index [CF] Theotis Cameron HERO, PA-C    Medical Decision Making [name] is a 38 y.o. female patient who presents to the emergency department today for further evaluation of intractable nausea vomiting. This patient with nausea and vomiting which is likely secondary to benign infectious cause. Considered but low risk for SBO (normal BM, passing flatus, no abdominal surgeries), no signs of DKA in labs. Patient BMP with normal electrolytes and no sign of  dehydration causing prerenal AKI. Low suspicion for gastric or esophageal dysmotility as cause. Patient with no chest pain so low suspicion for ACS. Based on history, exam, and work up low suspicion for pancreatitis, appendicitis, biliary pathology, or other emergent problem. Patient given zofran  and tolerated PO here. Feeling better after fluid. Patient also likely dehydrated. Patient to be discharged with zofran  and to follow up with PMD.   Amount and/or Complexity of Data Reviewed Labs: ordered. Decision-making details documented in ED Course. Radiology: ordered.  Risk Prescription drug management.    Final diagnoses:  Nausea and vomiting, unspecified vomiting type    ED Discharge Orders  Ordered    ondansetron  (ZOFRAN -ODT) 4 MG disintegrating tablet  Every 8 hours PRN        05/05/24 1628               Theotis Cameron HERO, PA-C 05/05/24 1630    Kammerer, Megan L, DO 05/12/24 [phone]

## 2024-05-05 NOTE — ED Notes (Signed)
 Assuming pt care at this time.

## 2024-05-05 NOTE — ED Notes (Signed)
 Reminded pt on necessity of urine sample. Pt states that she is still unable to urinate, but will use call bell when she's able to.

## 2024-05-05 NOTE — ED Notes (Signed)
 Pt given water and cracker for PO challenge per provider's request. Pt tolerated well. Pt denies N/V/D. Provider notified.

## 2024-05-05 NOTE — Discharge Instructions (Addendum)
 All your labs were reassuring today.  I have sent some Zofran  to help with the nausea and vomiting to your pharmacy.  I would stick with a BRAT diet.  This stands for bananas, rice, applesauce, toast.  This is some bland food that can help with the digestion.  You can slowly start to incorporate normal diet as tolerated.  Please drink plenty of fluids and get plenty of rest.  You may return to the emergency department for any worsening symptoms.
